# Patient Record
Sex: Male | Born: 2000 | Race: Black or African American | Hispanic: No | Marital: Single | Smoking: Never smoker
Health system: Southern US, Community
[De-identification: ages and names within clinical notes are randomized; demographics above are authoritative.]

## PROBLEM LIST (undated history)

## (undated) DIAGNOSIS — F79 Unspecified intellectual disabilities: Secondary | ICD-10-CM

## (undated) DIAGNOSIS — I639 Cerebral infarction, unspecified: Secondary | ICD-10-CM

## (undated) DIAGNOSIS — J324 Chronic pansinusitis: Secondary | ICD-10-CM

## (undated) DIAGNOSIS — U071 COVID-19: Secondary | ICD-10-CM

## (undated) DIAGNOSIS — E162 Hypoglycemia, unspecified: Secondary | ICD-10-CM

## (undated) DIAGNOSIS — R569 Unspecified convulsions: Secondary | ICD-10-CM

## (undated) DIAGNOSIS — E079 Disorder of thyroid, unspecified: Secondary | ICD-10-CM

## (undated) DIAGNOSIS — F909 Attention-deficit hyperactivity disorder, unspecified type: Secondary | ICD-10-CM

## (undated) DIAGNOSIS — R531 Weakness: Secondary | ICD-10-CM

## (undated) DIAGNOSIS — E237 Disorder of pituitary gland, unspecified: Secondary | ICD-10-CM

## (undated) DIAGNOSIS — I6389 Other cerebral infarction: Secondary | ICD-10-CM

## (undated) HISTORY — PX: TONSILLECTOMY: SUR1361

## (undated) HISTORY — PX: EYE SURGERY: SHX253

---
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
## 2019-01-28 ENCOUNTER — Other Ambulatory Visit: Payer: Self-pay | Admitting: Family Medicine

## 2019-01-28 DIAGNOSIS — F909 Attention-deficit hyperactivity disorder, unspecified type: Secondary | ICD-10-CM

## 2019-01-28 NOTE — Telephone Encounter (Signed)
Patients mom called to get a refill on his [facility], please give pt's mom a call back.

## 2019-01-29 NOTE — Telephone Encounter (Signed)
Needs appointment for further refills. Please schedule patient for visit. Can be in [place], DO, PGY-3 [facility] 01/29/2019 8:19 AM

## 2019-02-05 NOTE — Telephone Encounter (Signed)
Pt mom informed and scheduled for an appt. Nhu Glasby Kennon Holter, CMA

## 2019-02-12 ENCOUNTER — Other Ambulatory Visit: Payer: Self-pay

## 2019-02-12 ENCOUNTER — Ambulatory Visit (INDEPENDENT_AMBULATORY_CARE_PROVIDER_SITE_OTHER): Payer: Medicaid Other | Admitting: Family Medicine

## 2019-02-12 ENCOUNTER — Encounter: Payer: Self-pay | Admitting: Family Medicine

## 2019-02-12 DIAGNOSIS — Z23 Encounter for immunization: Secondary | ICD-10-CM | POA: Diagnosis not present

## 2019-02-12 DIAGNOSIS — F909 Attention-deficit hyperactivity disorder, unspecified type: Secondary | ICD-10-CM | POA: Diagnosis not present

## 2019-02-12 DIAGNOSIS — I6389 Other cerebral infarction: Secondary | ICD-10-CM

## 2019-02-12 MED ORDER — DEXMETHYLPHENIDATE HCL 10 MG PO TABS: 10.0000 mg | ORAL_TABLET | Freq: Two times a day (BID) | ORAL | 0 refills | Status: DC

## 2019-02-12 NOTE — Assessment & Plan Note (Signed)
Sounds like Focalin has worked for him in the past.  I discussed with him reasons that he would not want to take it and he was not able to really tell me.  He did agree to try going back on it more regularly.  I will refill.  I recommended they follow-up with your PCP more often as she is quite complicated and it would be nice to see him every 2 to 3 months for a while.

## 2019-02-12 NOTE — Patient Instructions (Signed)
I have sent your Rx for focalin in. Please make an appointment on your way out with your PCP for one month. Please ask Dr. Buddy Duty to send [place] some notes of office visits.

## 2019-02-12 NOTE — Assessment & Plan Note (Signed)
Evaluated by cardiology in the I felt he did not meet diagnostic criteria for Brugada syndrome

## 2019-02-12 NOTE — Progress Notes (Signed)
    CHIEF COMPLAINT / HPI: With his respite care worker, Sean Kim.  I spoke with his mother via phone. He has not been taking his Focalin regularly but admits his behavior is much worse without it.  He cannot exactly tell me why he does not want to take it every day.  Evidently he has had some recent behavioral issues and he is now ready to retry that.  He denies any problems with it specifically no abdominal pain, no headache, no insomnia.   #2.  He reports following up with the endocrinologist.  When I spoke to mom, she said he saw him about 6 to 8 weeks ago and follows regularly.  The endocrinologist is taking care of all of his endocrine disorders and he is taking his thyroid medicine regularly per report. REVIEW OF SYSTEMS: Denies fever, denies unusual weight change, denies cough  PERTINENT  PMH / PSH: I have reviewed the patient's medications, allergies, past medical and surgical history, smoking status and updated in the EMR as appropriate.   OBJECTIVE:  Vital signs reviewed. GENERAL: Well-developed, well-nourished, no acute distress. CARDIOVASCULAR: Regular rate and rhythm no murmur gallop or rub LUNGS: Clear to auscultation bilaterally, no rales or wheeze. ABDOMEN: Soft positive bowel sounds NEURO: No gross focal neurological deficits.  His speech is somewhat slowed.  He answers simple questions and follows commands. MSK: Movement of extremity x 4.    ASSESSMENT / PLAN:   Abnormal EKG Evaluated by cardiology in the I felt he did not meet diagnostic criteria for Brugada syndrome  ADHD Sounds like Focalin has worked for him in the past.  I discussed with him reasons that he would not want to take it and he was not able to really tell me.  He did agree to try going back on it more regularly.  I will refill.  I recommended they follow-up with your PCP more often as she is quite complicated and it would be nice to see him every 2 to 3 months for a while.   Immunization: After  some discussion he agreed to take the flu shot today if I would give him 2 stickers which I did.

## 2019-03-09 NOTE — Progress Notes (Signed)
   Subjective:    Patient ID: Sean Kim, male    DOB: 2000-05-24, 19 y.o.   MRN: [id number]   CC: ADHD f/u   HPI: ADHD Seen with Belinda Block, respite care worker who works with patient daily  Medication: focalin 10mg  bid Compliance: mother reports poor compliance, can go 2-3 days without medications. Patient reports this is because he is tired from staying up all night so won't wake up when mother calls and won't take medications unless mother give it to him Behavior: reports he is more calm, Wells Guiles notes that he seems upbeat today but after taking the focalin 10mg  he seemed lethargic  Appetite: eating well  Concentration: not concentrating at [employer], but Wells Guiles thinks it is because he does not like it. Had an interview at [facility] [employer] and concentrated throughout the interview. Is able to do practice interviewed with Wells Guiles and concentrate. When he gets tired he does not concentrate as well.  Sleep: horrible, stays up all night playing video games and watching things on his phone Symptoms: no chest pain, palpitaitons, SOB, or agitation.    Objective:  BP 102/72   Pulse 91   Wt 213 lb 6.4 oz (96.8 kg)   SpO2 99%   BMI 33.42 kg/m  Vitals and nursing note reviewed  General: well nourished, in no acute distress HEENT: normocephalic, PERRL, no scleral icterus, EOMI, moist mucous membranes  Neck: supple  Cardiac: RRR, clear S1 and S2, no murmurs, rubs, or gallops Respiratory: clear to auscultation bilaterally, no increased work of breathing Abdomen: soft, nontender, nondistended, no masses or organomegaly. Bowel sounds present Extremities: no edema or cyanosis. Warm, well perfused.   Skin: warm and dry, no rashes noted Neuro: alert and oriented, no focal deficits   Assessment & Plan:    ADHD Dose of Focalin seems to be too high for patient as his respite care worker describes him as being too tired and lethargic.  Patient's concentration seems to be related to being  tired.  Patient does not sleep at night because he is playing video games and watching television all night.  Discussed sleep hygiene with patient.  Advised to stop using all electronics around 8 PM to avoid light stimulation.  Regular sleep cycle discussed with patient.  Mother also reports he is noncompliant on medications because he is not sleeping well.  Hopefully sleeping well daily and being compliant medications will help with his concentration.  Will decrease Focalin to 5 mg twice daily.  Follow-up in 1 month to ensure this is helpful.    Return in about 1 month (around 04/11/2019).   Caroline More, DO, PGY-3

## 2019-03-11 ENCOUNTER — Encounter: Payer: Self-pay | Admitting: Family Medicine

## 2019-03-11 ENCOUNTER — Other Ambulatory Visit: Payer: Self-pay

## 2019-03-11 ENCOUNTER — Ambulatory Visit (INDEPENDENT_AMBULATORY_CARE_PROVIDER_SITE_OTHER): Payer: Medicaid Other | Admitting: Family Medicine

## 2019-03-11 DIAGNOSIS — F909 Attention-deficit hyperactivity disorder, unspecified type: Secondary | ICD-10-CM | POA: Diagnosis present

## 2019-03-11 MED ORDER — DEXMETHYLPHENIDATE HCL 5 MG PO TABS: 5.0000 mg | ORAL_TABLET | Freq: Two times a day (BID) | ORAL | 0 refills | Status: DC

## 2019-03-11 NOTE — Assessment & Plan Note (Signed)
Dose of Focalin seems to be too high for patient as his respite care worker describes him as being too tired and lethargic.  Patient's concentration seems to be related to being tired.  Patient does not sleep at night because he is playing video games and watching television all night.  Discussed sleep hygiene with patient.  Advised to stop using all electronics around 8 PM to avoid light stimulation.  Regular sleep cycle discussed with patient.  Mother also reports he is noncompliant on medications because he is not sleeping well.  Hopefully sleeping well daily and being compliant medications will help with his concentration.  Will decrease Focalin to 5 mg twice daily.  Follow-up in 1 month to ensure this is helpful.

## 2019-03-11 NOTE — Patient Instructions (Signed)
Attention Deficit Hyperactivity Disorder, Adult Attention deficit hyperactivity disorder (ADHD) is a mental health disorder that starts during childhood (neurodevelopmental disorder). For many people with ADHD, the disorder continues into the adult years. Treatment can help you manage your symptoms. What are the causes? The exact cause of ADHD is not known. Most experts believe genetics and environmental factors contribute to ADHD. What increases the risk? The following factors may make you more likely to develop this condition:  Having a family history of ADHD.  Being male.  Being born to a mother who smoked or drank alcohol during pregnancy.  Being exposed to lead or other toxins in the womb or early in life.  Being born before 37 weeks of pregnancy (prematurely) or at a low birth weight.  Having experienced a brain injury. What are the signs or symptoms? Symptoms of this condition depend on the type of ADHD. The two main types are inattentive and hyperactive-impulsive. Some people may have symptoms of both types. Symptoms of the inattentive type include:  Difficulty paying attention.  Making careless mistakes.  Not following instructions.  Being disorganized.  Avoiding tasks that require time and attention.  Losing and forgetting things.  Being easily distracted. Symptoms of the hyperactive-impulsive type include:  Restlessness.  Talking too much.  Interrupting.  Difficulty with: ? Sitting still. ? Feeling motivated. ? Relaxing. ? Waiting in line or waiting for a turn. In adults, this condition may lead to certain problems, such as:  Keeping jobs.  Performing tasks at work.  Having stable relationships.  Being on time or keeping to a schedule. How is this diagnosed? This condition is diagnosed based on your current symptoms and your history of symptoms. The diagnosis can be made by a health care provider such as a primary care provider or a mental health  care specialist. Your health care provider may use a symptom checklist or a behavior rating scale to evaluate your symptoms. He or she may also want to talk with people who have observed your behaviors throughout your life. How is this treated? This condition can be treated with medicines and behavior therapy. Medicines may be the best option to reduce impulsive behaviors and improve attention. Your health care provider may recommend:  Stimulant medicines. These are the most common medicines used for adult ADHD. They affect certain chemicals in the brain (neurotransmitters) and improve your ability to control your symptoms.  A non-stimulant medicine for adult ADHD (atomoxetine). This medicine increases a neurotransmitter called norepinephrine. It may take weeks to months to see effects from this medicine. Counseling and behavioral management are also important for treating ADHD. Counseling is often used along with medicine. Your health care provider may suggest:  Cognitive behavioral therapy (CBT). This type of therapy teaches you to replace negative thoughts and actions with positive thoughts and actions. When used as part of ADHD treatment, this therapy may also include: ? Coping strategies for organization, time management, impulse control, and stress reduction. ? Mindfulness and meditation training.  Behavioral management. You may work with a coach who is specially trained to help people with ADHD manage and organize activities and function more effectively. Follow these instructions at home: Medicines   Take over-the-counter and prescription medicines only as told by your health care provider.  Talk with your health care provider about the possible side effects of your medicines and how to manage them. Lifestyle   Do not use drugs.  Do not drink alcohol if: ? Your health care provider tells you   not to drink. ? You are pregnant, may be pregnant, or are planning to become  pregnant.  If you drink alcohol: ? Limit how much you use to:  0-1 drink a day for women.  0-2 drinks a day for men. ? Be aware of how much alcohol is in your drink. In the [place] one drink equals one 12 oz bottle of beer (355 mL), one 5 oz glass of wine (148 mL), or one 1 oz glass of hard liquor (44 mL).  Get enough sleep.  Eat a healthy diet.  Exercise regularly. Exercise can help to reduce stress and anxiety. General instructions  Learn as much as you can about adult ADHD, and work closely with your health care providers to find the treatments that work best for you.  Follow the same schedule each day.  Use reminder devices like notes, calendars, and phone apps to stay on time and organized.  Keep all follow-up visits as told by your health care provider and therapist. This is important. Where to find more information A health care provider may be able to recommend resources that are available online or over the phone. You could start with:  Attention Deficit Disorder Association (ADDA): PubAddiction.co.nz  National Institute of Mental Health [facility]): https://carter.com/ Contact a health care provider if:  Your symptoms continue to cause problems.  You have side effects from your medicine, such as: ? Repeated muscle twitches, coughing, or speech outbursts. ? Sleep problems. ? Loss of appetite. ? Dizziness. ? Unusually fast heartbeat. ? Stomach pains. ? Headaches.  You are struggling with anxiety, depression, or substance abuse. Get help right away if you:  Have a severe reaction to a medicine. If you ever feel like you may hurt yourself or others, or have thoughts about taking your own life, get help right away. You can go to the nearest emergency department or call:  Your local emergency services (911 in the [place]  A suicide crisis helpline, such as the [place] at 9797047340. This is open 24 hours a day. Summary  ADHD is a mental health  disorder that starts during childhood (neurodevelopmental disorder) and often continues into the adult years.  The exact cause of ADHD is not known. Most experts believe genetics and environmental factors contribute to ADHD.  There is no cure for ADHD, but treatment with medicine, cognitive behavioral therapy, or behavioral management can help you manage your condition. This information is not intended to replace advice given to you by your health care provider. Make sure you discuss any questions you have with your health care provider. Document Revised: 07/15/2018 Document Reviewed: 07/15/2018 Elsevier Patient Education  [place] tablets What is this medicine? DEXMETHYLPHENIDATE (dex meth ill FEN i date) is used to treat attention-deficit hyperactivity disorder. Federal law prohibits the transfer of this medicine to any person other than the person for whom it was prescribed. Do not share this medicine with anyone else. This medicine may be used for other purposes; ask your health care provider or pharmacist if you have questions. COMMON BRAND NAME(S): Focalin What should I tell my health care provider before I take this medicine? They need to know if you have any of these conditions:  circulation problems in fingers and toes  hardening or blockages of the arteries or heart blood vessels  heart disease or a heart defect  high blood pressure  history of a drug or alcohol abuse problem  history of stroke  mental  illness  suicidal thoughts, plans, or attempt; a previous suicide attempt by you or a family member  an unusual or allergic reaction to dexmethylphenidate, methylphenidate, other medicines, foods, dyes, or preservatives  pregnant or trying to get pregnant  breast-feeding How should I use this medicine? Take this medicine by mouth with a glass of water. Follow the directions on the prescription label. You can take this medicine with or  without food. Take your doses at regular intervals. Usually the last dose of the day will be taken at least 4 to 6 hours before your normal bedtime, so it will not interfere with sleep. Do not take your medicine more often than directed. A special MedGuide will be given to you by the pharmacist with each prescription and refill. Be sure to read this information carefully each time. Talk to your pediatrician regarding the use of this medicine in children. While this medicine may be prescribed for children as young as 6 years for selected conditions, precautions do apply. Overdosage: If you think you have taken too much of this medicine contact a poison control center or emergency room at once. NOTE: This medicine is only for you. Do not share this medicine with others. What if I miss a dose? If you miss a dose, take it as soon as you can. If it is almost time for your next dose, take only that dose. Do not take double or extra doses. What may interact with this medicine? Do not take this medicine with any of the following medications:  lithium  MAOIs like Carbex, Eldepryl, Marplan, Nardil, and Parnate  other stimulant medicines for attention disorders, weight loss, or to stay awake  procarbazine This medicine may also interact with the following medications:  atomoxetine  caffeine  certain medicines for blood pressure, heart disease, irregular heart beat  certain medicines for depression, anxiety, or psychotic disturbances  certain medicines for seizures like carbamazepine, phenobarbital, phenytoin  cold or allergy medicines  general anesthetics like halothane, isoflurane  medicines that increase the blood pressure like dopamine, dobutamine, or ephedrine  warfarin This list may not describe all possible interactions. Give your health care provider a list of all the medicines, herbs, non-prescription drugs, or dietary supplements you use. Also tell them if you smoke, drink alcohol,  or use illegal drugs. Some items may interact with your medicine. What should I watch for while using this medicine? Visit your doctor or health care professional for regular checks on your progress. This prescription requires that you follow special procedures with your doctor and pharmacy. You will need to have a new written prescription from your doctor or health care professional every time you need a refill. This medicine may affect your concentration, or hide signs of tiredness. Until you know how this drug affects you, do not drive, ride a bicycle, use machinery, or do anything that needs mental alertness. Tell your doctor or health care professional if this medicine loses its effects, or if you feel you need to take more than the prescribed amount. Do not change the dosage without talking to your doctor or health care professional. For males, contact you doctor or health care professional right away if you have an erection that lasts longer than 4 hours or if it becomes painful. This may be a sign of serious problem and must be treated right away to prevent permanent damage. Decreased appetite is a common side effect when starting this medicine. Eating small, frequent meals or snacks can help. Talk to  your doctor if you continue to have poor eating habits. Height and weight growth of a child taking this medicine will be monitored closely. Do not take this medicine close to bedtime. It may prevent you from sleeping. If you are going to need surgery, a MRI, CT scan, or other procedure, tell your doctor that you are taking this medicine. You may need to stop taking this medicine before the procedure. Tell your doctor or healthcare professional right away if you notice unexplained wounds on your fingers and toes while taking this medicine. You should also tell your healthcare provider if you experience numbness or pain, changes in the skin color, or sensitivity to temperature in your fingers or  toes. What side effects may I notice from receiving this medicine? Side effects that you should report to your doctor or health care professional as soon as possible:  allergic reactions like skin rash, itching or hives, swelling of the face, lips, or tongue  changes in vision  chest pain or chest tightness  fast, irregular heartbeat  fingers or toes feel numb, cool, painful  hallucination, loss of contact with reality  high blood pressure  males: prolonged or painful erection  seizures  severe headaches  shortness of breath  suicidal thoughts or other mood changes  trouble walking, dizziness, loss of balance or coordination  uncontrollable head, mouth, neck, arm, or leg movements  unusual bleeding or bruising Side effects that usually do not require medical attention (report to your doctor or health care professional if they continue or are bothersome):  anxious  headache  loss of appetite  nausea, vomiting  trouble sleeping  weight loss This list may not describe all possible side effects. Call your doctor for medical advice about side effects. You may report side effects to FDA at 1-800-FDA-1088. Where should I keep my medicine? Keep out of the reach of children. This medicine can be abused. Keep your medicine in a safe place to protect it from theft. Do not share this medicine with anyone. Selling or giving away this medicine is dangerous and against the law. This medicine may cause accidental overdose and death if taken by other adults, children, or pets. Mix any unused medicine with a substance like cat litter or coffee grounds. Then throw the medicine away in a sealed container like a sealed bag or a coffee can with a lid. Do not use the medicine after the expiration date. Store at room temperature between 15 and 30 degrees C (59 and 86 degrees F). Protect from light and moisture. NOTE: This sheet is a summary. It may not cover all possible information. If  you have questions about this medicine, talk to your doctor, pharmacist, or health care provider.  2020 Elsevier/Gold Standard (2017-03-19 09:45:54)

## 2019-04-17 ENCOUNTER — Telehealth: Payer: Self-pay | Admitting: Family Medicine

## 2019-04-17 ENCOUNTER — Encounter: Payer: Self-pay | Admitting: Family Medicine

## 2019-04-17 NOTE — Telephone Encounter (Signed)
Mother is calling and would like a letter written about her son's disability so that he can get transportation please call mother to discuss. jw

## 2019-04-17 NOTE — Telephone Encounter (Signed)
Mychart message sent to discuss letter and what is needed in the letter. Will attempt to complete once I have this information   Caroline More, DO, PGY-3 [facility] 04/17/2019 4:57 PM

## 2019-04-18 ENCOUNTER — Encounter: Payer: Self-pay | Admitting: Family Medicine

## 2019-04-18 NOTE — Progress Notes (Signed)
Note for [facility] written. Please upload to Jilda Roche, [place], PGY-3 [facility] 04/18/2019 1:51 PM

## 2019-04-18 NOTE — Telephone Encounter (Signed)
Discussed with mother over the phone. Patient needs a letter written to inform SCAT why he would need transportation. Patient requires transport 2/2 cognitive deficit 2/2 CVA. I have written that in note per mother's request as she said they need specific diagnosis in the letter.  Please upload letter to mychart.   Dalphine Handing, PGY-3 [facility] 04/18/2019 1:52 PM

## 2019-04-19 ENCOUNTER — Encounter (HOSPITAL_COMMUNITY): Payer: Self-pay

## 2019-04-19 ENCOUNTER — Other Ambulatory Visit: Payer: Self-pay

## 2019-04-19 ENCOUNTER — Ambulatory Visit (HOSPITAL_COMMUNITY)
Admission: EM | Admit: 2019-04-19 | Discharge: 2019-04-19 | Disposition: A | Discharge status: Home / Self Care | Payer: Medicaid Other | Attending: Urgent Care | Admitting: Urgent Care

## 2019-04-19 DIAGNOSIS — M545 Low back pain, unspecified: Secondary | ICD-10-CM

## 2019-04-19 MED ORDER — NAPROXEN 500 MG PO TABS: 500.0000 mg | ORAL_TABLET | Freq: Two times a day (BID) | ORAL | 0 refills | Status: DC

## 2019-04-19 MED ORDER — TIZANIDINE HCL 4 MG PO TABS: 4.0000 mg | ORAL_TABLET | Freq: Every evening | ORAL | 0 refills | Status: DC | PRN
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
## 2019-04-21 ENCOUNTER — Telehealth: Payer: Self-pay | Admitting: Family Medicine

## 2019-04-21 NOTE — Telephone Encounter (Signed)
Pt. Presented the Professional Verification Form regarding ADA. Last appointment was 03/11/19.   Form placed in red folder

## 2019-04-21 NOTE — Telephone Encounter (Signed)
Reviewed transportation (SCAT) form and placed in PCP's box for completion.  Ozella Almond, [place]

## 2019-04-22 NOTE — Telephone Encounter (Signed)
Voicemail left on patients/mothers phone informing of SCAT forms ready for pick up. I have placed them up front if they call back.

## 2019-04-22 NOTE — Telephone Encounter (Signed)
Form completed and placed in RN box  Caroline More, DO, PGY-3 [facility] 04/22/2019 9:27 AM

## 2019-06-18 NOTE — Telephone Encounter (Signed)
Mother calls nurse line stating SCAT needs more information on patient disability. Mother stated just a letter would be fine, no need for new forms. SCAT needs more info on the limitations of the patient, what he can and can not do, and a more detailed diagnoses. Case worker will pick up once completed. Please advise.

## 2019-06-24 ENCOUNTER — Encounter: Payer: Self-pay | Admitting: Family Medicine

## 2019-06-24 ENCOUNTER — Telehealth: Payer: Self-pay | Admitting: Family Medicine

## 2019-06-24 NOTE — Telephone Encounter (Signed)
Attempted to call but no answer. Sent [employer].   I needed to clarify what specifically SCAT required in the letter. If mother calls please ask her specifically what parts of patient's limitations he would need as I previously wrote in the form what his diagnosis was and his need for transportation. I have also sent a mychart message so I am hopeful she will get back to me via mychart.   Once I have this info I can upload letter to mychart.   Dalphine Handing, PGY-3 [facility] 06/24/2019 12:13 PM

## 2019-06-24 NOTE — Telephone Encounter (Signed)
Was able to call and speak to mom over the phone.  Patient's mom will call his caseworker and obtain the specific details that are needed in the letter.  Patient mother reports that a letter with [facility] letterhead is sufficient.  She will send me a my chart message with this information.  After I obtain this information I can write this letter and upload to my chart.  Dalphine Handing, PGY-3 [facility] 06/24/2019 2:34 PM

## 2019-06-27 ENCOUNTER — Telehealth: Payer: Self-pay | Admitting: Family Medicine

## 2019-06-27 NOTE — Telephone Encounter (Signed)
Note for SCAT form sent via [place], DO, PGY-3 [facility] 06/27/2019 6:54 PM

## 2019-07-03 ENCOUNTER — Encounter: Payer: Self-pay | Admitting: Family Medicine

## 2019-07-04 ENCOUNTER — Ambulatory Visit (INDEPENDENT_AMBULATORY_CARE_PROVIDER_SITE_OTHER): Payer: Medicaid Other | Admitting: Family Medicine

## 2019-07-04 ENCOUNTER — Other Ambulatory Visit: Payer: Self-pay

## 2019-07-04 ENCOUNTER — Telehealth: Payer: Self-pay

## 2019-07-04 VITALS — BP 96/72 | HR 90 | Ht 67.0 in | Wt 223.6 lb

## 2019-07-04 DIAGNOSIS — Z636 Dependent relative needing care at home: Secondary | ICD-10-CM | POA: Diagnosis not present

## 2019-07-04 DIAGNOSIS — Z9151 Personal history of suicidal behavior: Secondary | ICD-10-CM

## 2019-07-04 DIAGNOSIS — Z915 Personal history of self-harm: Secondary | ICD-10-CM | POA: Diagnosis not present

## 2019-07-04 DIAGNOSIS — E162 Hypoglycemia, unspecified: Secondary | ICD-10-CM | POA: Diagnosis present

## 2019-07-04 LAB — GLUCOSE, POCT (MANUAL RESULT ENTRY): POC Glucose: 90 mg/dl (ref 70–99)

## 2019-07-04 NOTE — Progress Notes (Signed)
SUBJECTIVE:   CHIEF COMPLAINT / HPI:   Hypoglycemia Patient has been followed by Dr. Buddy Duty in endocrinology, but over the last few months has become noncompliant with his medication.  Does have a history of hyperglycemia so it was checked today during this visit and was found to be normal. No distress today  Caregiver burden  He was in no physical distress during our visit and was willing to answer some questions, said he has no desire to hurt himself but usually does not feel like it matters if he lives or dies at this point.  He says it is unfair that he has to take all the medication that he does and he is simply tired of it.  Cannot accurately articulate the cost of not taking his medication and according to his stepmother she still has a legal guardian and is not independent although he is big enough and willful enough to refuse medication.  They do both admit that in the past he has attempted suicide by walking into the street but that there is been none of that behavior or threats of such at the house recently.  Stepmom denies that she thinks he needs to go to emergency department or attempt to be committing him but is just hoping that there is some sort of resource to help convince him to start taking care of himself.  Does consent to social work referral because she is feeling overwhelmed  History of attempted suicide Patient does have history of attempted suicide and has been committed at [facility] in the past.  We did have thorough discussion about his PHQ-9 in which he said that he daily thinks that he would be okay if he died but he has no active wish to kill himself or hurt anybody else.  His mother confirms that he is not acting like he was prior to his prior suicide attempts and she does not think that he is to be committed.  He has not been physically threatening anyone else at home or himself.  They have taken precautions after his first attempt to rid the house of weapons.     Mom does accept social work referral for caregiver stress    PERTINENT  PMH / PSH:   OBJECTIVE:   BP 96/72   Pulse 90   Ht 5\' 7"  (1.702 m)   Wt 223 lb 9.6 oz (101.4 kg)   SpO2 99%   BMI 35.02 kg/m   General: Alert, conversational but lacking depth of thought, no physical distress, flat affect Cardiac: Giller rate and rhythm, no murmurs noted Respiratory: Clear to auscultation, no increased work of breathing, no cough, no wheeze   ASSESSMENT/PLAN:   Hypoglycemia Patient has been followed by Dr. Buddy Duty in endocrinology, but over the last few months has become noncompliant with his medication.  Does have a history of hyperglycemia so it was checked today during this visit and was found to be normal. No distress today  Caregiver burden  He was in no physical distress during our visit and was willing to answer some questions, said he has no desire to hurt himself but usually does not feel like it matters if he lives or dies at this point.  He says it is unfair that he has to take all the medication that he does and he is simply tired of it.  Cannot accurately articulate the cost of not taking his medication and according to his stepmother she still has a legal guardian  and is not independent although he is big enough and willful enough to refuse medication.  They do both admit that in the past he has attempted suicide by walking into the street but that there is been none of that behavior or threats of such at the house recently.  Stepmom denies that she thinks he needs to go to emergency department or attempt to be committing him but is just hoping that there is some sort of resource to help convince him to start taking care of himself.  Does consent to social work referral because she is feeling overwhelmed  History of attempted suicide Patient does have history of attempted suicide and has been committed at [facility] in the past.  We did have thorough discussion about his PHQ-9 in  which he said that he daily thinks that he would be okay if he died but he has no active wish to kill himself or hurt anybody else.  His mother confirms that he is not acting like he was prior to his prior suicide attempts and she does not think that he is to be committed.  He has not been physically threatening anyone else at home or himself.  They have taken precautions after his first attempt to rid the house of weapons.    Mom does accept social work referral for caregiver stress     Sherene Sires, [place]

## 2019-07-04 NOTE — Telephone Encounter (Signed)
Patient's mother calls nurse line regarding f/u on mychart message from yesterday 07/03/19. Mother reports mood swings, decreased appetite and increased lethargy. Patient is currently at Dr. Cindra Eves office, however they recommended him being seen by primary care doctor. Mother requesting that patient be seen today.   Scheduled patient in ATC for evaluation this afternoon.   ED precautions given.   To PCP and Dr. Criss Rosales (ATC provider)

## 2019-07-04 NOTE — Patient Instructions (Signed)
Today we talked about refusal to take medication and a feeling of apathy and the belief that you might be better off dead.  I know we talked about you not having an active plan for that, were going to have your stepmother keeping an eye on you and if that changes we do think that you also call the 24-hour hotline or go to the behavioral health urgent care at [facility].  It does seem like you are at least safe from a blood sugar standpoint today which was a concern.  I want to reiterate that I think it is important you start taking your medications as prescribed by Dr. Buddy Duty, I cannot see the notes or actually know which those medications are but he is a specialist in this field and I would suggest he take his advice.  I do think it will help you feel better.   If you are feeling suicidal or depression symptoms worsen please immediately go to:   24 Hour Availability [facility]  [address] [place], [place] [address]  [phone] or [phone]  . If you are thinking about harming yourself or having thoughts of suicide, or if you know someone who is, seek help right away. . Call your doctor or mental health care provider. . Call 911 or go to a hospital emergency room to get immediate help, or ask a friend or family member to help you do these things. . Call the [place] National Suicide Prevention Lifeline's toll-free, 24-hour hotline at 1-800-273-TALK 201 838 7030) or TTY: 1-800-799-4 TTY 3370996279) to talk to a trained counselor. . If you are in crisis, make sure you are not left alone.  . If someone else is in crisis, make sure he or she is not left alone   [facility] [employer] (Domestic Violence, Rape & Victim Assistance [phone]  [facility]  [address], [place]  [address]               [phone] or [phone]  [place]    (ONLY from 8am-4pm)     [phone]  [facility] (24/7)   [phone]  [place] National Suicide Hotline   [phone] Diamantina Monks)

## 2019-07-05 DIAGNOSIS — Z9151 Personal history of suicidal behavior: Secondary | ICD-10-CM | POA: Insufficient documentation

## 2019-07-05 DIAGNOSIS — Z636 Dependent relative needing care at home: Secondary | ICD-10-CM | POA: Insufficient documentation

## 2019-07-05 DIAGNOSIS — Z915 Personal history of self-harm: Secondary | ICD-10-CM | POA: Insufficient documentation

## 2019-07-05 NOTE — Assessment & Plan Note (Signed)
Patient does have history of attempted suicide and has been committed at [facility] in the past.  We did have thorough discussion about his PHQ-9 in which he said that he daily thinks that he would be okay if he died but he has no active wish to kill himself or hurt anybody else.  His mother confirms that he is not acting like he was prior to his prior suicide attempts and she does not think that he is to be committed.  He has not been physically threatening anyone else at home or himself.  They have taken precautions after his first attempt to rid the house of weapons.    Mom does accept social work referral for caregiver stress

## 2019-07-05 NOTE — Assessment & Plan Note (Signed)
He was in no physical distress during our visit and was willing to answer some questions, said he has no desire to hurt himself but usually does not feel like it matters if he lives or dies at this point.  He says it is unfair that he has to take all the medication that he does and he is simply tired of it.  Cannot accurately articulate the cost of not taking his medication and according to his stepmother she still has a legal guardian and is not independent although he is big enough and willful enough to refuse medication.  They do both admit that in the past he has attempted suicide by walking into the street but that there is been none of that behavior or threats of such at the house recently.  Stepmom denies that she thinks he needs to go to emergency department or attempt to be committing him but is just hoping that there is some sort of resource to help convince him to start taking care of himself.  Does consent to social work referral because she is feeling overwhelmed

## 2019-07-05 NOTE — Assessment & Plan Note (Addendum)
Patient has been followed by Dr. Buddy Duty in endocrinology, but over the last few months has become noncompliant with his medication.  Does have a history of hyperglycemia so it was checked today during this visit and was found to be normal. No distress today

## 2019-07-15 ENCOUNTER — Encounter: Payer: Self-pay | Admitting: Family Medicine

## 2019-07-15 ENCOUNTER — Ambulatory Visit: Payer: Self-pay | Admitting: Licensed Clinical Social Worker

## 2019-07-15 NOTE — Chronic Care Management (AMB) (Signed)
    Clinical Social Work  Care Management Outreach   07/15/2019 Name: Sean Kim MRN: [id number] DOB: Apr 25, 2000  Sean Kim is a 19 y.o. year old male who is a primary care patient of Caroline More, DO .   The Care Management team was consulted by Dr. Criss Rosales for assistance with Caregiver Stress.  LCSW reached out to Sean Kim's mother today by phone to introduce self, assess needs and offer Care Management services and interventions.  The outreach was unsuccessful.  A HIPPA compliant phone message was left for the patient providing contact information and requesting a return call.   Review of patient status, including review of consultants reports, relevant laboratory and other test results, and collaboration with appropriate care team members and the patient's provider was performed as part of comprehensive patient evaluation and provision of care management services.    Follow Up Plan: If no return call is received, LCSW will call again in 3 to 5 days.  Casimer Lanius, [place] / [place]   [phone] 1:59 PM

## 2019-07-17 ENCOUNTER — Encounter: Payer: Self-pay | Admitting: Family Medicine

## 2019-07-18 ENCOUNTER — Other Ambulatory Visit: Payer: Self-pay

## 2019-07-18 ENCOUNTER — Telehealth: Payer: Medicaid Other

## 2019-07-18 ENCOUNTER — Ambulatory Visit: Payer: Self-pay | Admitting: Licensed Clinical Social Worker

## 2019-07-18 NOTE — Chronic Care Management (AMB) (Signed)
    Clinical Social Work  Care Management Outreach   07/18/2019 Name: Rucker Toma MRN: [id number] DOB: June 23, 2000  Austan Contorno is a 19 y.o. year old male who is a primary care patient of Caroline More, DO . The Care Management team was consulted for assistance with Caregiver Stress and support.  LCSW reached out to [place] Montalvo's mother today by phone to introduce self, assess needs.  Mother was unable to talk, states she will call LCSW back this afternoon, phone number provided.  Review of patient status, including review of consultants reports, relevant laboratory and other test results, and collaboration with appropriate care team members and the patient's provider was performed as part of comprehensive patient evaluation and provision of care management services.    Follow Up Plan: LCSW will wait for return call.  If no return call is received LCSW will discontinue outreach.  Casimer Lanius, [place] / [place]   [phone] 11:02 AM

## 2019-07-22 ENCOUNTER — Ambulatory Visit: Payer: Self-pay | Admitting: Licensed Clinical Social Worker

## 2019-07-22 NOTE — Chronic Care Management (AMB) (Signed)
    Clinical Social Work  Care Management Outreach   07/22/2019 Name: Sean Kim MRN: [id number] DOB: [birth year]  Sean Kim is a 19 y.o. year old male who is a primary care patient of Caroline More, DO . LCSW returning phone call to  [name]'s mother today.  Left voice messaged. Mother called back.    Review of patient status, including review of consultants reports, relevant laboratory and other test results, and collaboration with appropriate care team members and the patient's provider was performed as part of comprehensive patient evaluation and provision of care management services.    Follow Up Plan: Phone appointment scheduled with LCSW  Casimer Lanius, [place] / [place]   [phone] 4:18 PM

## 2019-07-25 ENCOUNTER — Encounter: Payer: Self-pay | Admitting: Licensed Clinical Social Worker

## 2019-07-25 ENCOUNTER — Ambulatory Visit: Payer: Medicaid Other | Admitting: Licensed Clinical Social Worker

## 2019-07-25 ENCOUNTER — Other Ambulatory Visit: Payer: Self-pay

## 2019-07-25 DIAGNOSIS — Z636 Dependent relative needing care at home: Secondary | ICD-10-CM

## 2019-07-25 NOTE — Chronic Care Management (AMB) (Signed)
Care Management   Clinical Social Work   07/25/2019 Name: Sean Kim MRN: [id number] DOB: 2000-03-09  Referred by: Caroline More, DO  Reason for referral : Care Coordination (caregiver stress and suppor) Sean Kim is a 19 y.o. year old male who is a primary care patient of Caroline More, DO.   Reason for Encounter: Phone encounter with patient's mother today for assessment of needs.  Assessment: Patient continues to experience frustration and levels of anxiety.  Is currently taking Focalin for ADHD. Patient often fails to take his medication as he does not like the way it makes him feel.  Per mom last Medication evaluation was done with when patient was in the 9th grade.  Medication currently filled by PCP. Recommendation: Patient may benefit from, and mom is in agreement to connect with psychiatry and counseling to assist patient with managing his mood and emotions.   Review of patient status, including review of consultants reports, relevant laboratory and other test results, and collaboration with appropriate care team members and the patient's provider was performed as part of comprehensive patient evaluation and provision of care management services.    Advance Directive Status: Not discussed during this encounter SDOH (Social Determinants of Health) assessments performed: No needs identified   Goals Addressed            This Visit's Progress   . mental health support       CARE PLAN ENTRY (see longitudinal plan of care for additional care plan information)  Current Barriers:  . Patient with ADHD and cogitative deficits Mom acknowledges deficits with connecting to mental health provider for counseling and possible medication evaluation.  . Patient is experiencing symptoms of depression and does not like the way his current medication makes him feel.     . Patient and caregiver needs Support, Education, and Care Coordination in order to meet unmet mental health needs    . Patient is doing better with taking focalin for ADHD when father ask him to take it.  However he does not like taking it due to the way it makes him feel. Clinical Social Work [employer]):  Marland Kitchen Over the next 30 days, LCSW will work with patient and caregiver until connected for ongoing counseling and medication management.  . Over the next 30 days, patient will demonstrate improved adherence to prescribed treatment plan for ADHD as evidenced report by mother.  Interventions:  . Assessed understanding, previous  and current treatment and care coordination needs  . Provided basic mental health support, education and interventions  . Collaborated with appropriate clinical care team members regarding patient needs . Discussed several options for long term counseling and medication management based on need and insurance. Will continue to review options and make recommendation. . Reviewed mental health medications with patient's mother at prescribed by PCP and discussed compliance  . Other interventions include: emotional support and task centered Patient Self Care Activities & Deficits:  . Patient is unable to independently navigate community resource options without care coordination support . Patient's mother is motivated for treatment and getting patient the help he needs Initial goal documentation       Outpatient Encounter Medications as of 07/25/2019  Medication Sig  . acetaminophen (TYLENOL) 500 MG tablet Take 1,000 mg by mouth every 6 (six) hours as needed for mild pain, fever or headache.  . B-D 3CC LUER-LOK SYR 22GX1-1/2 22G X 1-1/2" 3 ML MISC USE TO INJECT TESTOSTERONE  . blood glucose meter kit and supplies KIT Dispense  based on patient and insurance preference. Use up to four times daily as directed. (FOR ICD-9 250.00, 250.01).  Marland Kitchen dexmethylphenidate (FOCALIN) 5 MG tablet Take 1 tablet (5 mg total) by mouth 2 (two) times daily.  . Glucagon, rDNA, (GLUCAGON EMERGENCY IJ) Inject 1 mg as  directed as needed (blood sugar).   Marland Kitchen levothyroxine (SYNTHROID) 125 MCG tablet Take 1 tablet (125 mcg total) by mouth daily at 6 (six) AM.  . naproxen (NAPROSYN) 500 MG tablet Take 1 tablet (500 mg total) by mouth 2 (two) times daily.  Marland Kitchen testosterone cypionate (DEPOTESTOSTERONE CYPIONATE) 200 MG/ML injection Inject 1 mL (200 mg total) into the muscle every 21 ( twenty-one) days.  Marland Kitchen tiZANidine (ZANAFLEX) 4 MG tablet Take 1 tablet (4 mg total) by mouth at bedtime as needed for muscle spasms.   No facility-administered encounter medications on file as of 07/25/2019.   Plan: LCSW will locate psychiatry options for patient and discuss with PCP and mom  Casimer Lanius, [place] / [place]   [phone] 4:13 PM

## 2019-08-01 ENCOUNTER — Ambulatory Visit: Payer: Medicaid Other | Admitting: Licensed Clinical Social Worker

## 2019-08-01 DIAGNOSIS — Z7189 Other specified counseling: Secondary | ICD-10-CM

## 2019-08-01 DIAGNOSIS — Z636 Dependent relative needing care at home: Secondary | ICD-10-CM

## 2019-08-01 NOTE — Patient Instructions (Signed)
Licensed Clinical Social Worker Visit Information Mr. Nigg  it was nice speaking with your mother. Please call me directly if you have questions [phone] Goals we discussed today:  Goals Addressed            This Visit's Progress   . Guardianship       CARE PLAN ENTRY (see longitudinal plan of care for additional care plan information)  Current Barriers:  . Patient needs community resources about guardianship,  . Mom Acknowledges deficits and needs support, education and care coordination in order to meet this unmet need Clinical Goal(s)  . Over the next 60 days patient's mother will be able to start guardianship process as demonstrated by completing required paperwork. Interventions provided by LCSW:  . Assessed education and understanding of guardianship  . Provided patient's mother with general information about the guardianship process Patient Self Care Activities & Deficits:  . Patient is unable to independently navigate community resource options without care coordination support  . Acknowledges deficits and is motivated to resolve concern, however has a lot going on and would like to revisit this goal in the next few weeks Initial goal documentation    . mental health support       CARE PLAN ENTRY (see longitudinal plan of care for additional care plan information)  Current Barriers & progress:  . Patient with ADHD and cogitative deficits Mom acknowledges deficits with connecting to mental health provider for counseling and possible medication evaluation.  . Patient is experiencing symptoms of depression and does not like the way his current medication makes him feel.     . Patient and caregiver needs Support, Education, and Care Coordination in order to meet unmet mental health needs  . Patient is doing better with taking focalin for ADHD when father ask him to take it.  However he does not like taking it due to the way it makes him feel. Clinical Social Work [employer]):   Marland Kitchen Over the next 30 days, LCSW will work with patient and caregiver until connected for ongoing counseling and medication management.  . Over the next 30 days, patient will demonstrate improved adherence to prescribed treatment plan for ADHD as evidenced report by mother.  Interventions:  . Assessed understanding, previous  and current treatment and care coordination needs  . Collaborated with appropriate clinical care team members regarding patient needs . Discussed several options for long term counseling and medication management based on need and insurance. Provided patient's mother with psychiatry and psychological resources to review and decide which will meet patient's needs ( [facility],  [facility], [facility] and  [facility]) . Other interventions include: Solution focus and task centered Patient Self Care Activities & Deficits:  . Patient is unable to independently navigate community resource options without care coordination support . Patient's mother is motivated for treatment and getting patient the help he needs . Mom will review information and make a selection will contact LCSW if needed Please see past updates related to this goal by clicking on the "Past Updates" button in the selected goal        Materials provided:  Mr. Khoury received Care Management services today:  1. Care Management services include personalized support from designated clinical staff supervised by his physician, including individualized plan of care and coordination with other care providers 2. 24/7 contact [phone] for assistance for urgent and routine care needs. 3. Care Management are voluntary services and be declined at  any time by calling the office.  patient's mother verbally agreed to assistance and services provided by embedded care coordination/care management team today.   Patient's mother verbalizes understanding of  instructions provided today.  Follow up plan:  SW will follow up with patient by phone over the next 2 weeks  Maurine Cane, LCSW

## 2019-08-01 NOTE — Chronic Care Management (AMB) (Signed)
Care Management   Clinical Social Work Follow Up   08/01/2019 Name: Larenzo Caples MRN: [id number] DOB: March 15, 2000  Referred by: Caroline More, DO  Reason for referral : Care Coordination (caregiver support and resources) Khyler Lambert is a 19 y.o. year old male who is a primary care patient of Caroline More, DO.   Reason for follow-up: Phone encounter with patient's mother today for ongoing assessment and brief interventions to assist with meeting patient's mental health needs.   Assessment: Patient's mother would like assistance with navigating mental health options to manage patient's behavior. Patient needs new evaluation or ADHD medication.   Review of patient status, including review of consultants reports, relevant laboratory and other test results, and collaboration with appropriate care team members and the patient's provider was performed as part of comprehensive patient evaluation and provision of care management services.    SDOH (Social Determinants of Health) assessments performed: No needs identified   Goals Addressed            This Visit's Progress   . Guardianship       CARE PLAN ENTRY (see longitudinal plan of care for additional care plan information)  Current Barriers:  . Patient needs community resources about guardianship,  . Mom Acknowledges deficits and needs support, education and care coordination in order to meet this unmet need Clinical Goal(s)  . Over the next 60 days patient's mother will be able to start guardianship process as demonstrated by completing required paperwork. Interventions provided by LCSW:  . Assessed education and understanding of guardianship  . Provided patient's mother with general information about the guardianship process Patient Self Care Activities & Deficits:  . Patient is unable to independently navigate community resource options without care coordination support  . Acknowledges deficits and is motivated to resolve  concern, however has a lot going on and would like to revisit this goal in the next few weeks Initial goal documentation    . mental health support       CARE PLAN ENTRY (see longitudinal plan of care for additional care plan information)  Current Barriers & progress:  . Patient with ADHD and cogitative deficits Mom acknowledges deficits with connecting to mental health provider for counseling and possible medication evaluation.  . Patient is experiencing symptoms of depression and does not like the way his current medication makes him feel.     . Patient and caregiver needs Support, Education, and Care Coordination in order to meet unmet mental health needs  . Patient is doing better with taking focalin for ADHD when father ask him to take it.  However he does not like taking it due to the way it makes him feel. Clinical Social Work [employer]):  Marland Kitchen Over the next 30 days, LCSW will work with patient and caregiver until connected for ongoing counseling and medication management.  . Over the next 30 days, patient will demonstrate improved adherence to prescribed treatment plan for ADHD as evidenced report by mother.  Interventions:  . Assessed understanding, previous  and current treatment and care coordination needs  . Collaborated with appropriate clinical care team members regarding patient needs . Discussed several options for long term counseling and medication management based on need and insurance. Provided patient's mother with psychiatry and psychological resources to review and decide which will meet patient's needs ( [facility],  [facility], [facility] and  [facility]) . Other interventions include: Solution focus and task centered Patient Self Care  Activities & Deficits:  . Patient is unable to independently navigate community resource options without care coordination support . Patient's mother is motivated for  treatment and getting patient the help he needs . Mom will review information and make a selection will contact LCSW if needed Please see past updates related to this goal by clicking on the "Past Updates" button in the selected goal         Outpatient Encounter Medications as of 08/01/2019  Medication Sig  . acetaminophen (TYLENOL) 500 MG tablet Take 1,000 mg by mouth every 6 (six) hours as needed for mild pain, fever or headache.  . B-D 3CC LUER-LOK SYR 22GX1-1/2 22G X 1-1/2" 3 ML MISC USE TO INJECT TESTOSTERONE  . blood glucose meter kit and supplies KIT Dispense based on patient and insurance preference. Use up to four times daily as directed. (FOR ICD-9 250.00, 250.01).  Marland Kitchen dexmethylphenidate (FOCALIN) 5 MG tablet Take 1 tablet (5 mg total) by mouth 2 (two) times daily.  . Glucagon, rDNA, (GLUCAGON EMERGENCY IJ) Inject 1 mg as directed as needed (blood sugar).   Marland Kitchen levothyroxine (SYNTHROID) 125 MCG tablet Take 1 tablet (125 mcg total) by mouth daily at 6 (six) AM.  . naproxen (NAPROSYN) 500 MG tablet Take 1 tablet (500 mg total) by mouth 2 (two) times daily.  Marland Kitchen testosterone cypionate (DEPOTESTOSTERONE CYPIONATE) 200 MG/ML injection Inject 1 mL (200 mg total) into the muscle every 21 ( twenty-one) days.  Marland Kitchen tiZANidine (ZANAFLEX) 4 MG tablet Take 1 tablet (4 mg total) by mouth at bedtime as needed for muscle spasms.   No facility-administered encounter medications on file as of 08/01/2019.   Plan: Mom will review resources provided.  LCSW will F/U in two weeks  Casimer Lanius, [place] / [place]   [phone] 3:46 PM

## 2019-08-07 ENCOUNTER — Encounter (HOSPITAL_COMMUNITY): Payer: Self-pay

## 2019-08-07 ENCOUNTER — Ambulatory Visit (INDEPENDENT_AMBULATORY_CARE_PROVIDER_SITE_OTHER): Payer: Medicaid Other

## 2019-08-07 ENCOUNTER — Other Ambulatory Visit: Payer: Self-pay

## 2019-08-07 ENCOUNTER — Ambulatory Visit (HOSPITAL_COMMUNITY)
Admission: EM | Admit: 2019-08-07 | Discharge: 2019-08-07 | Disposition: A | Discharge status: Home / Self Care | Payer: Medicaid Other

## 2019-08-07 DIAGNOSIS — M25512 Pain in left shoulder: Secondary | ICD-10-CM | POA: Diagnosis not present

## 2019-08-07 DIAGNOSIS — M79605 Pain in left leg: Secondary | ICD-10-CM

## 2019-08-07 MED ORDER — NAPROXEN 500 MG PO TABS
500.0000 mg | ORAL_TABLET | Freq: Two times a day (BID) | ORAL | 0 refills | Status: DC
Start: 2019-08-07 — End: 2019-09-10

## 2019-08-07 MED ORDER — CYCLOBENZAPRINE HCL 5 MG PO TABS: 5.0000 mg | ORAL_TABLET | Freq: Two times a day (BID) | ORAL | 0 refills | Status: DC | PRN

## 2019-08-07 NOTE — ED Provider Notes (Signed)
[place]    CSN: [id number] Arrival date & time: 08/07/19  1551      History   Chief Complaint Chief Complaint  Patient presents with  . Leg Pain  . Arm Pain    HPI Sean Kim is a 19 y.o. male history of cognitive deficits, presenting today for evaluation of left shoulder and left thigh/knee pain after injury.  Patient was tackled approximately 1 week ago playing football, landed on his left side.  Since he has pain in his left shoulder along with his left leg.  Mainly in the thigh and knee.  Does report feeling a popping sensation at time of incident.  Denies persistent popping tearing sensation.  Denies increased instability.  Denies hitting head or loss of consciousness.  Has taken medicine for pain, but is unsure of the name.  HPI  Past Medical History:  Diagnosis Date  . ADHD   . Hypoglycemia   . MR (mental retardation)   . Pituitary abnormality ([place])   . Seizures ([place])   . Stroke ([place])   . Thyroid disease     Patient Active Problem List   Diagnosis Date Noted  . Caregiver burden 07/05/2019  . History of attempted suicide 07/05/2019  . Constipation 11/12/2018  . Abnormal EKG 11/12/2018  . Elevated alkaline phosphatase level 05/29/2018  . Routine adult health maintenance 10/31/2017  . Healthy adult on routine physical examination 10/31/2017  . 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([place])   . Cognitive deficits   . Intermittent left side weakness   . 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 ADHD 07/29/2017  . Hypoglycemia 07/23/2017    Past Surgical History:  Procedure Laterality Date  . EYE SURGERY     x2  . TONSILLECTOMY         Home Medications    Prior to Admission medications   Medication Sig Start Date End Date Taking? Authorizing Provider  cloNIDine (CATAPRES) 0.1 MG tablet Take by mouth. 02/08/16  Yes [provider]  acetaminophen (TYLENOL) 500 MG tablet Take 1,000 mg by mouth every 6 (six) hours as needed  for mild pain, fever or headache.    [provider]  B-D 3CC LUER-LOK SYR 22GX1-1/2 22G X 1-1/2" 3 ML MISC USE TO INJECT TESTOSTERONE 08/09/17   [provider]  blood glucose meter kit and supplies KIT Dispense based on patient and insurance preference. Use up to four times daily as directed. (FOR ICD-9 250.00, 250.01). 08/03/17   Sherene Sires, DO  cyclobenzaprine (FLEXERIL) 5 MG tablet Take 1-2 tablets (5-10 mg total) by mouth 2 (two) times daily as needed for muscle spasms. 08/07/19   Tayven Renteria C, PA-C  dexmethylphenidate (FOCALIN) 5 MG tablet Take 1 tablet (5 mg total) by mouth 2 (two) times daily. 03/11/19   Caroline More, DO  Glucagon, rDNA, (GLUCAGON EMERGENCY IJ) Inject 1 mg as directed as needed (blood sugar).     [provider]  levothyroxine (SYNTHROID) 125 MCG tablet Take 1 tablet (125 mcg total) by mouth daily at 6 (six) AM. 11/09/18   Mullis, Kiersten P, DO  naproxen (NAPROSYN) 500 MG tablet Take 1 tablet (500 mg total) by mouth 2 (two) times daily. 08/07/19   Saheed Carrington C, PA-C  testosterone cypionate (DEPOTESTOSTERONE CYPIONATE) 200 MG/ML injection Inject 1 mL (200 mg total) into the muscle every 21 ( twenty-one) days. 08/17/17   Rogue Bussing, MD    Family History Family History  Problem Relation Age of Onset  .  Hypertension Maternal Grandmother     Social History Social History   Tobacco Use  . Smoking status: Never Smoker  . Smokeless tobacco: Never Used  Substance Use Topics  . Alcohol use: No  . Drug use: No     Allergies   Patient has no known allergies.   Review of Systems Review of Systems  Constitutional: Negative for fatigue and fever.  HENT: Negative for congestion, sinus pressure and sore throat.   Eyes: Negative for photophobia, pain and visual disturbance.  Respiratory: Negative for cough and shortness of breath.   Cardiovascular: Negative for chest pain.  Gastrointestinal: Negative for abdominal pain,  nausea and vomiting.  Genitourinary: Negative for decreased urine volume and hematuria.  Musculoskeletal: Positive for arthralgias and myalgias. Negative for neck pain and neck stiffness.  Neurological: Negative for dizziness, syncope, facial asymmetry, speech difficulty, weakness, light-headedness, numbness and headaches.     Physical Exam Triage Vital Signs ED Triage Vitals [08/07/19 1608]  Enc Vitals Group     BP 113/84     Pulse Rate (!) 106     Resp 16     Temp 98 F (36.7 C)     Temp src      SpO2 100 %     Weight      Height      Head Circumference      Peak Flow      Pain Score 8     Pain Loc      Pain Edu?      Excl. in [place]?    No data found.  Updated Vital Signs BP 113/84   Pulse (!) 106   Temp 98 F (36.7 C)   Resp 16   SpO2 100%   Visual Acuity Right Eye Distance:   Left Eye Distance:   Bilateral Distance:    Right Eye Near:   Left Eye Near:    Bilateral Near:     Physical Exam Vitals and nursing note reviewed.  Constitutional:      Appearance: He is well-developed.     Comments: No acute distress  HENT:     Head: Normocephalic and atraumatic.     Nose: Nose normal.  Eyes:     Conjunctiva/sclera: Conjunctivae normal.  Cardiovascular:     Rate and Rhythm: Normal rate.  Pulmonary:     Effort: Pulmonary effort is normal. No respiratory distress.  Abdominal:     General: There is no distension.  Musculoskeletal:        General: Normal range of motion.     Cervical back: Neck supple.     Comments: Left shoulder: Nontender to palpation along clavicle, tenderness to palpation of AC joint and into proximal humeral area, tender diffusely throughout trapezius and periscapular area of thoracic back Full active range of motion of shoulder Nontender distally at elbow forearm or wrist  Left knee: Mild tenderness to palpation over patella, nontender along medial and lateral joint line, nontender and popliteal area or extending into calf, does have  suprapatellar tenderness that extends into belly of quadricep musculature  Skin:    General: Skin is warm and dry.  Neurological:     Mental Status: He is alert and oriented to person, place, and time.      UC Treatments / Results  Labs (all labs ordered are listed, but only abnormal results are displayed) Labs Reviewed - No data to display  EKG   Radiology DG Knee Complete 4 Views Left  Result Date:  08/07/2019 CLINICAL DATA:  Left knee pain, fall EXAM: LEFT KNEE - COMPLETE 4+ VIEW COMPARISON:  None. FINDINGS: No evidence of fracture, dislocation, or joint effusion. No evidence of arthropathy or other focal bone abnormality. Soft tissues are unremarkable. IMPRESSION: Negative. Electronically Signed   By: Rolm Baptise M.D.   On: 08/07/2019 17:37    Procedures Procedures (including critical care time)  Medications Ordered in UC Medications - No data to display  Initial Impression / Assessment and Plan / UC Course  I have reviewed the triage vital signs and the nursing notes.  Pertinent labs & imaging results that were available during my care of the patient were reviewed by me and considered in my medical decision making (see chart for details).     X-rays of shoulder and knee negative for acute fracture.  Will call and alter plan if radiology read differing.  Most likely spraining and contusions from impact.  Recommending anti-inflammatories and muscle relaxers with gentle stretching, alternate ice and heat.  Continue to monitor for gradual improvement.  Discussed strict return precautions. Patient verbalized understanding and is agreeable with plan.  Final Clinical Impressions(s) / UC Diagnoses   Final diagnoses:  Left leg pain  Acute pain of left shoulder     Discharge Instructions     Xray appear normal- I will call if radiologist reads xrays differently Naprosyn twice daily for pain/swelling Ice  Gentle stretching You may use flexeril as needed to help with  shoulder/back pain. This is a muscle relaxer and causes sedation- please use only at bedtime or when you will be home and not have to drive/work  Follow up if not improving    ED Prescriptions    Medication Sig Dispense Auth. Provider   naproxen (NAPROSYN) 500 MG tablet Take 1 tablet (500 mg total) by mouth 2 (two) times daily. 30 tablet Taniesha Glanz C, PA-C   cyclobenzaprine (FLEXERIL) 5 MG tablet Take 1-2 tablets (5-10 mg total) by mouth 2 (two) times daily as needed for muscle spasms. 24 tablet Terralyn Matsumura, [place] C, PA-C     PDMP not reviewed this encounter.   Janith Lima, PA-C 08/07/19 1740

## 2019-08-07 NOTE — ED Triage Notes (Signed)
Pt c/o left leg and left arm pain after getting tackled playing football last Friday

## 2019-08-07 NOTE — Discharge Instructions (Signed)
Xray appear normal- I will call if radiologist reads xrays differently Naprosyn twice daily for pain/swelling Ice  Gentle stretching You may use flexeril as needed to help with shoulder/back pain. This is a muscle relaxer and causes sedation- please use only at bedtime or when you will be home and not have to drive/work  Follow up if not improving

## 2019-08-12 ENCOUNTER — Ambulatory Visit (INDEPENDENT_AMBULATORY_CARE_PROVIDER_SITE_OTHER): Payer: Medicaid Other | Admitting: Pediatrics

## 2019-08-12 ENCOUNTER — Encounter (INDEPENDENT_AMBULATORY_CARE_PROVIDER_SITE_OTHER): Payer: Self-pay | Admitting: Pediatrics

## 2019-08-12 ENCOUNTER — Other Ambulatory Visit: Payer: Self-pay

## 2019-08-12 VITALS — BP 108/72 | HR 80 | Ht 69.0 in | Wt 216.8 lb

## 2019-08-12 DIAGNOSIS — U071 COVID-19: Secondary | ICD-10-CM

## 2019-08-12 DIAGNOSIS — G9349 Other encephalopathy: Secondary | ICD-10-CM

## 2019-08-12 DIAGNOSIS — I6389 Other cerebral infarction: Secondary | ICD-10-CM | POA: Diagnosis not present

## 2019-08-12 NOTE — Patient Instructions (Addendum)
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  Specifically he is experiencing problems with memory and thinking that have worsened his baseline difficulties.  Looking at him closely today I see no change in his exam in comparison with 2 years ago.  Nonetheless the symptoms that he complains of are similar to people who complaint of thinking and memory as result of Covid that some people called "brain fog".  As best I know there is no diagnostic test for this and no treatment.  I am fairly certain that [name] has a long-haul clinic.  I am hopeful that your primary physician can access that for you.  If you have a problem with it please get back with me and I will see what I can do.

## 2019-08-12 NOTE — Progress Notes (Signed)
Patient: Sean Kim MRN: [id number] Sex: male DOB: Jul 28, 2000  Provider: Wyline Copas, MD Location of Care: [facility]  Note type: Routine return visit  History of Present Illness: Referral Source: Olene Floss, MD History from: mother, patient and [facility] chart Chief Complaint: Memory loss  Sean Kim is a 19 y.o. male who was evaluated August 12, 2019 for the first time since August 21, 2017.  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  He had a prolonged seizure-like event that caused injury to the right brain manifested by diffuse cortical and subcortical atrophy hydrocephalus ex vacuo and wall area degeneration.  Subsequent to this, during periods of hypoglycemia he experiences left-sided weakness but at baseline is not obvious.  This is described in the August 21, 2017 note.  He responds to Solu-Cortef.  His most recent CT scan in Aug 02, 2017 was unchanged with no acute lesion.  He has been followed at [facility] in the past but was never placed on antiepileptic medicine he had two EEGs which showed slowing that was paradoxical more prominent over the left side than the right despite his atrophy.  He had significant decline in his cognitive abilities following the 2014 event and his reading and spelling on the first grade level in math on a second grade level.  He was on a vocational track at [employer] and graduated with a Theme park manager.  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  He contracted coronavirus and was diagnosed September 24, 2018.  He continued to have intermittent symptoms, became coronavirus negative and then positive again.  He had multiple episodes of hypotension was treated with dexamethasone, remdesivir because of desaturation.  The Covid team felt that this was not related to Covid he was hospitalized August 30 through September 4.  He is noted to have enlarged and partially  necrotic left cervical adenopathy treated with a 10-day course of Augmentin.  He met criteria for Brugada syndrome.  Following this course, his mother said that he had problems with memory he would forget things.  At work, he becomes very frustrated when he could not remember the orders of people despite the fact that the orders are written on the screen in the areas where he works.  Is not clear to me why he would not use that.  He often forgets conversations.  He becomes very angry when he forgets things.  He has been placed on naproxen 500 mg daily as anti-inflammatory.  He has not had any seizures in 2 years.  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  I realize the physicians at [facility] who certainly are more versed in this than I felt that his hospitalization in late August early September was not truly related to Covid.  His mother requested neurological consultation because she felt that there was something wrong with him that needed to be addressed.  He seems to be sleeping well.  He has not otherwise been ill.  Review of Systems: A complete review of systems was remarkable for patient is here to be seen for memory loss. Mom reports that the patient has been havng issues with memry loss. She also ststae sthat he is forgetting things. She states that it is everyday things. She states that if he is given instructions, he forgets and it has started to embarass him.  She states thta if he misplaces something, he will get angry and blame others but when he finds it, he is okay. She reports no seizures since his last visit. No other concerns at this time., all other systems reviewed and negative.  Past Medical History Diagnosis Date  . ADHD   . Hypoglycemia   . MR (mental retardation)   . 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 Seizures ([place])   . Stroke ([place])   . Thyroid disease    Hospitalizations:  Yes.  , Head Injury: No., Nervous System Infections: No., Immunizations up to date: Yes.    See history of present illness  Birth History 6 lbs. 0 oz. infant born at [redacted] weeks gestational age to a 20 year old g 2 p 0 1 0 1 male. Gestation was complicated by early delivery second to sexually transmitted disease? Mother received Epidural anesthesia  Repeat cesarean section Nursery Course was complicated by discovery of pan-hypopituitarism Growth and Development was recalled as  by history normal  Behavior History hyperactive, low frustration tolerance, explosive behavior  Surgical History Procedure Laterality Date  . EYE SURGERY     x2  . TONSILLECTOMY     Family History family history includes Hypertension in his maternal grandmother. Family history is negative for migraines, seizures, intellectual disabilities, blindness, deafness, birth defects, chromosomal disorder, or autism.  Social History Socioeconomic History  . Marital status: Single  . Years of education:  107  . Highest education level:  High school certificate  Occupational History  .  Supervised employment at [employer]  Tobacco Use  . Smoking status: Never Smoker  . Smokeless tobacco: Never Used  Substance and Sexual Activity  . Alcohol use: No  . Drug use: No  . 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    He is not currently in school.    He lives with his step mom only.    He has four siblings.    Works at [employer] (has community care person that goes to work with him [facility])    [place] services via [employer]   No Known Allergies  Physical Exam BP 108/72   Pulse 80   Ht 5' 9" (1.753 m)   Wt 216 lb 12.8 oz (98.3 kg)   BMI 32.02 kg/m   General: alert, well developed, well nourished, in no acute distress, black hair, brown eyes, right handed Head: normocephalic, no dysmorphic features Ears, Nose and Throat: Otoscopic: tympanic membranes normal;  pharynx: oropharynx is pink without exudates or tonsillar hypertrophy Neck: supple, full range of motion, no cranial or cervical bruits Respiratory: auscultation clear Cardiovascular: no murmurs, pulses are normal Musculoskeletal: no skeletal deformities or apparent scoliosis Skin: no rashes or neurocutaneous lesions  Neurologic Exam  Mental Status: alert; oriented to person; knowledge is below normal for age; language is adequate for naming objects and following commands Cranial Nerves: visual fields are full to double simultaneous stimuli; extraocular movements are full and conjugate; pupils are round reactive to light; funduscopic examination shows sharp disc margins with normal vessels; symmetric facial strength; midline tongue and uvula; air conduction is greater than bone conduction bilaterally Motor: normal strength, tone and mass; good fine motor movements; no pronator drift on the right, mild weakness and clumsiness on the left, no pronator drift Sensory: intact responses to cold, vibration, proprioception and stereognosis bilaterally  Coordination: good finger-to-nose, rapid repetitive alternating movements and finger apposition Gait and Station: normal gait  and station: patient is able to walk on heels, toes and tandem without difficulty; balance is adequate; Romberg exam is negative; Gower response is negative Reflexes: symmetric and diminished bilaterally; no clonus; bilateral flexor plantar responses  Assessment 1.  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 2.  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  Unless his mother dates his problems with thinking to the time when he was ill with corona which extended over a period of a few weeks.  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  Plan I am not certain how best to help him.  I see no change in his examination in comparison with 2019 therefore do not think that there would be much benefit to performing an MRI of his brain.  If indeed this is related to Covid, it could be a long-haul syndrome.  I would like to have him evaluated in our long-haul clinic to see if any other work-up and/or treatment would be useful.  Greater than 50% of a 25-minute visit was spent in counseling and coordination of care concerning his encephalopathy and discussing the need for him to be seen by people who have witnessed symptoms associated with a long-haul syndrome.  He will return to see me as needed.   Medication List  TAKE these medications   acetaminophen 500 MG tablet Commonly known as: TYLENOL Take 1,000 mg by mouth every 6 (six) hours as needed for mild pain, fever or headache.   B-D 3CC LUER-LOK SYR 22GX1-1/2 22G X 1-1/2" 3 ML Misc Generic drug: SYRINGE-NEEDLE (DISP) 3 ML USE TO INJECT TESTOSTERONE   blood glucose meter kit and supplies Kit Dispense based on patient and insurance preference. Use up to four times daily as directed. (FOR ICD-9 250.00, 250.01).   cloNIDine 0.1 MG tablet Commonly known as: CATAPRES Take by mouth.   cyclobenzaprine 5 MG tablet Commonly known as: FLEXERIL Take 1-2 tablets (5-10 mg total) by mouth 2 (two) times daily as needed for muscle spasms.   dexmethylphenidate 5 MG tablet Commonly known as: FOCALIN Take 1 tablet (5 mg total) by mouth 2 (two) times daily.   GLUCAGON EMERGENCY IJ Inject 1 mg as directed as needed (blood sugar).   levothyroxine 125 MCG tablet Commonly known as: SYNTHROID Take 1 tablet (125 mcg total) by mouth daily at 6 (six) AM.   levothyroxine 137 MCG tablet Commonly known as: SYNTHROID Take 137 mcg by mouth every morning.   naproxen 500 MG tablet Commonly known as: NAPROSYN Take 1 tablet (500 mg  total) by mouth 2 (two) times daily.    The medication list was reviewed and reconciled. All changes or newly prescribed medications were explained.  A complete medication list was provided to the patient/caregiver.  Jodi Geralds MD

## 2019-08-15 ENCOUNTER — Ambulatory Visit: Payer: Medicaid Other | Admitting: Licensed Clinical Social Worker

## 2019-08-15 ENCOUNTER — Other Ambulatory Visit: Payer: Self-pay

## 2019-08-15 DIAGNOSIS — Z636 Dependent relative needing care at home: Secondary | ICD-10-CM

## 2019-08-15 NOTE — Chronic Care Management (AMB) (Signed)
Clinical Social Work  Care Management referral   08/15/2019 Name: Sean Kim MRN: [id number] DOB: 08/17/2000 Sean Kim is a 19 y.o. year old male who is a primary care patient of Caroline More, DO   LCSW reached out to [place]'s mother today by phone to assess needs and barriers with connecting to resources provided for mental health support.   Assessment: Mom has not been able to review or contact any of the providers previously discussed.  Intervention: Patient does not require or desire additional follow up. Assessment of needs and barriers completed; Patient's  mother still has information and resources previously provided. Plan:  1.  Mom will review mental health providers and decide which one will meet patient's needs 2.  Mom will contact LCSW if needed  Advance Directive Status: not addressed during this encounter previously discussed guardianship process and provided paper work. SDOH (Social Determinants of Health)  No needs identified   Goals Addressed            This Visit's Progress   . mental health support   Not on track    [place] (see longitudinal plan of care for additional care plan information)  Current Barriers & progress:  . Patient with ADHD and cogitative deficits Mom acknowledges deficits with connecting to mental health provider for counseling and possible medication evaluation.  . Patient is experiencing symptoms of depression and does not like the way his current medication makes him feel.     . Patient and caregiver needs Support, Education, and Care Coordination in order to meet unmet mental health needs  . Patient is doing better with taking focalin for ADHD when father ask him to take it.  However he does not like taking it due to the way it makes him feel . Mom has not been able to F/U on information and resources previously discussed Clinical Social Work Goal(s):  Marland Kitchen Over the next 30 days, LCSW will work with patient and  caregiver until connected for ongoing counseling and medication management.  . Over the next 30 days, patient will demonstrate improved adherence to prescribed treatment plan for ADHD as evidenced report by mother.  Interventions:  . Assessed barriers with connecting to resources   . Discussed several options for long term counseling and medication management based on need and insurance. Provided patient's mother with psychiatry and psychological resources to review and decide which will meet patient's needs ( [facility],  [facility], [facility] and  [facility]) . Other interventions include: Solution focus and task centered Patient Self Care Activities & Deficits:  . Patient is unable to independently navigate community resource options without care coordination support . Patient's mother is motivated for treatment and getting patient the help he needs . Mom will review information and make a selection will contact LCSW if needed Please see past updates related to this goal by clicking on the "Past Updates" button in the selected goal        Outpatient Encounter Medications as of 08/15/2019  Medication Sig  . acetaminophen (TYLENOL) 500 MG tablet Take 1,000 mg by mouth every 6 (six) hours as needed for mild pain, fever or headache.  . B-D 3CC LUER-LOK SYR 22GX1-1/2 22G X 1-1/2" 3 ML MISC USE TO INJECT TESTOSTERONE  . blood glucose meter kit and supplies KIT Dispense based on patient and insurance preference. Use up to four times daily as directed. (FOR ICD-9 250.00, 250.01).  Marland Kitchen  cloNIDine (CATAPRES) 0.1 MG tablet Take by mouth.  . cyclobenzaprine (FLEXERIL) 5 MG tablet Take 1-2 tablets (5-10 mg total) by mouth 2 (two) times daily as needed for muscle spasms.  Marland Kitchen dexmethylphenidate (FOCALIN) 5 MG tablet Take 1 tablet (5 mg total) by mouth 2 (two) times daily.  . Glucagon, rDNA, (GLUCAGON EMERGENCY IJ) Inject 1 mg as directed  as needed (blood sugar).   Marland Kitchen levothyroxine (SYNTHROID) 125 MCG tablet Take 1 tablet (125 mcg total) by mouth daily at 6 (six) AM.  . levothyroxine (SYNTHROID) 137 MCG tablet Take 137 mcg by mouth every morning.  . naproxen (NAPROSYN) 500 MG tablet Take 1 tablet (500 mg total) by mouth 2 (two) times daily.   No facility-administered encounter medications on file as of 08/15/2019.   Review of patient status, including review of consultants reports, relevant laboratory and other test results, and collaboration with appropriate care team members and the patient's provider was performed as part of comprehensive patient evaluation and provision of care management services.    Casimer Lanius, LCSW Care Management Coordinator [place] [facility] [place]   [phone] 3:13 PM

## 2019-09-10 ENCOUNTER — Other Ambulatory Visit: Payer: Self-pay

## 2019-09-10 ENCOUNTER — Ambulatory Visit (INDEPENDENT_AMBULATORY_CARE_PROVIDER_SITE_OTHER): Payer: Medicaid Other

## 2019-09-10 ENCOUNTER — Encounter (HOSPITAL_COMMUNITY): Payer: Self-pay

## 2019-09-10 ENCOUNTER — Ambulatory Visit (HOSPITAL_COMMUNITY)
Admission: EM | Admit: 2019-09-10 | Discharge: 2019-09-10 | Disposition: A | Discharge status: Home / Self Care | Payer: Medicaid Other | Attending: Urgent Care | Admitting: Urgent Care

## 2019-09-10 DIAGNOSIS — S93402A Sprain of unspecified ligament of left ankle, initial encounter: Secondary | ICD-10-CM

## 2019-09-10 DIAGNOSIS — M25572 Pain in left ankle and joints of left foot: Secondary | ICD-10-CM | POA: Diagnosis not present

## 2019-09-10 MED ORDER — NAPROXEN 500 MG PO TABS
500.0000 mg | ORAL_TABLET | Freq: Two times a day (BID) | ORAL | 0 refills | Status: DC
Start: 2019-09-10 — End: 2020-01-26

## 2019-09-10 NOTE — ED Triage Notes (Signed)
Patient is here today with mother, Markham Jordan, with complaints of left ankle pain. 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

## 2019-09-10 NOTE — ED Provider Notes (Signed)
[name]    CSN: [id number] Arrival date & time: 09/10/19  [id number]      History   Chief Complaint Chief Complaint  Patient presents with  . Ankle Pain    HPI Sean Kim is a 19 y.o. male.   Sean Kim presents with complaints of left ankle pain after a fall yesterday while at work. Tripped while refilling an ice machine at work. Pain to ankle since, worse with weight bearing. Today feels worse. Denies any previous injury to the left ankle or foot. Some decreased sensation to left lateral ankle. Hasn't taken any medications for pain. Ambulatory.    ROS per HPI, negative if not otherwise mentioned.      Past Medical History:  Diagnosis Date  . ADHD   . Hypoglycemia   . MR (mental retardation)   . 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 Seizures ([place])   . Stroke ([place])   . Thyroid disease     Patient Active Problem List   Diagnosis Date Noted  . Caregiver burden 07/05/2019  . History of attempted suicide 07/05/2019  . Constipation 11/12/2018  . Abnormal EKG 11/12/2018  . Encephalopathy due to COVID-19 virus 10/18/2018  . Elevated alkaline phosphatase level 05/29/2018  . Routine adult health maintenance 10/31/2017  . Healthy adult on routine physical examination 10/31/2017  . 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([place])   . Cognitive deficits   . Intermittent left side weakness   . Other cerebral infarction ([place])   . 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 ADHD 07/29/2017  . Hypoglycemia 07/23/2017    Past Surgical History:  Procedure Laterality Date  . EYE SURGERY     x2  . TONSILLECTOMY         Home Medications    Prior to Admission medications   Medication Sig Start Date End Date Taking? Authorizing Provider  acetaminophen (TYLENOL) 500 MG tablet Take 1,000 mg by mouth every 6 (six) hours as needed for mild pain, fever or headache.    [provider]  B-D 3CC LUER-LOK SYR 22GX1-1/2 22G X 1-1/2" 3 ML MISC USE TO INJECT TESTOSTERONE 08/09/17   [provider]  blood glucose meter kit and supplies KIT Dispense based on patient and insurance preference. Use up to four times daily as directed. (FOR ICD-9 250.00, 250.01). 08/03/17   Sherene Sires, DO  cloNIDine (CATAPRES) 0.1 MG tablet Take by mouth. 02/08/16   [provider]  cyclobenzaprine (FLEXERIL) 5 MG tablet Take 1-2 tablets (5-10 mg total) by mouth 2 (two) times daily as needed for muscle spasms. 08/07/19   Wieters, Hallie C, PA-C  dexmethylphenidate (FOCALIN) 5 MG tablet Take 1 tablet (5 mg total) by mouth 2 (two) times daily. 03/11/19   Caroline More, DO  Glucagon, rDNA, (GLUCAGON EMERGENCY IJ) Inject 1 mg as directed as needed (blood sugar).     [provider]  levothyroxine (SYNTHROID) 125 MCG tablet Take 1 tablet (125 mcg total) by mouth daily at 6 (six) AM. 11/09/18   Mullis, Kiersten P, DO  levothyroxine (SYNTHROID) 137 MCG tablet Take 137 mcg by mouth every morning. 03/13/19   [provider]  naproxen (NAPROSYN) 500 MG tablet Take 1 tablet (500 mg total) by mouth 2 (two) times daily. 09/10/19   Zigmund Gottron, NP    Family History Family History  Problem Relation Age of Onset  . Hypertension Maternal Grandmother     Social History Social History   Tobacco Use  . Smoking status: Never Smoker  . Smokeless  tobacco: Never Used  Vaping Use  . Vaping Use: Never used  Substance Use Topics  . Alcohol use: No  . Drug use: No     Allergies   Patient has no known allergies.   Review of Systems Review of Systems   Physical Exam Triage Vital Signs ED Triage Vitals  Enc Vitals Group     BP 09/10/19 1040 113/74     Pulse Rate 09/10/19 1040 75     Resp 09/10/19 1040 16     Temp 09/10/19 1040 98.8 F (37.1 C)     Temp Source 09/10/19 1040 Oral     SpO2 09/10/19 1040 97 %     Weight --      Height --      Head Circumference --      Peak Flow --      Pain Score 09/10/19 1042 9     Pain Loc --      Pain Edu? --      Excl. in [place]? --     No data found.  Updated Vital Signs BP 113/74 (BP Location: Right Arm)   Pulse 75   Temp 98.8 F (37.1 C) (Oral)   Resp 16   SpO2 97%    Physical Exam Constitutional:      Appearance: He is well-developed.  Cardiovascular:     Rate and Rhythm: Normal rate.  Pulmonary:     Effort: Pulmonary effort is normal.  Musculoskeletal:     Left ankle: No swelling. Tenderness present over the lateral malleolus and medial malleolus.     Comments: Left ankle with medial and lateral bony tenderness as well as soft tissue tenderness; pain with dorsiflexion; strong pulse; minimal swelling or redness; foot WNL  Skin:    General: Skin is warm and dry.  Neurological:     Mental Status: He is alert and oriented to person, place, and time.      UC Treatments / Results  Labs (all labs ordered are listed, but only abnormal results are displayed) Labs Reviewed - No data to display  EKG   Radiology DG Ankle Complete Left  Result Date: 09/10/2019 CLINICAL DATA:  19 year old who injured the LEFT ankle at work yesterday, fell while filling an ice machine. Pain anteriorly. Initial encounter. EXAM: LEFT ANKLE COMPLETE - 3+ VIEW COMPARISON:  None. FINDINGS: No evidence of acute fracture or dislocation. Recently closed physes in the distal tibia and distal fibula which accounts for the linear sclerosis. Anatomically aligned ankle joint with a well-preserved joint space. No visible joint effusion. IMPRESSION: No acute osseous abnormality. Electronically Signed   By: Evangeline Dakin M.D.   On: 09/10/2019 11:41    Procedures Procedures (including critical care time)  Medications Ordered in UC Medications - No data to display  Initial Impression / Assessment and Plan / UC Course  I have reviewed the triage vital signs and the nursing notes.  Pertinent labs & imaging results that were available during my care of the patient were reviewed by me and considered in my medical decision making (see chart  for details).     Films reassuring. Ambulatory. Consistent with ankle sprain. Brace provided and pain management and course of injury discussed. Patient and mother verbalized understanding and agreeable to plan.   Final Clinical Impressions(s) / UC Diagnoses   Final diagnoses:  Sprain of left ankle, unspecified ligament, initial encounter     Discharge Instructions     Your xray is normal today. There are  no indications of any broken bones.  Your exam is consistent with an ankle sprain.  Ice, elevation, naproxen twice a day (with food) to help with pain.  Ankle brace as needed to help with pain.  Activity as tolerated.  If no improvement or any worsening of symptoms over the next month I would recommend following up with sports medicine.     ED Prescriptions    Medication Sig Dispense Auth. Provider   naproxen (NAPROSYN) 500 MG tablet Take 1 tablet (500 mg total) by mouth 2 (two) times daily. 30 tablet Zigmund Gottron, NP     PDMP not reviewed this encounter.   Zigmund Gottron, NP 09/10/19 1158

## 2019-09-10 NOTE — Discharge Instructions (Signed)
Your xray is normal today. There are no indications of any broken bones.  Your exam is consistent with an ankle sprain.  Ice, elevation, naproxen twice a day (with food) to help with pain.  Ankle brace as needed to help with pain.  Activity as tolerated.  If no improvement or any worsening of symptoms over the next month I would recommend following up with sports medicine.

## 2019-10-08 ENCOUNTER — Other Ambulatory Visit: Payer: Self-pay

## 2019-10-08 ENCOUNTER — Encounter: Payer: Self-pay | Admitting: Family Medicine

## 2019-10-08 ENCOUNTER — Ambulatory Visit (INDEPENDENT_AMBULATORY_CARE_PROVIDER_SITE_OTHER): Payer: Medicaid Other | Admitting: Family Medicine

## 2019-10-08 DIAGNOSIS — E162 Hypoglycemia, unspecified: Secondary | ICD-10-CM

## 2019-10-08 DIAGNOSIS — Z915 Personal history of self-harm: Secondary | ICD-10-CM

## 2019-10-08 DIAGNOSIS — F909 Attention-deficit hyperactivity disorder, unspecified type: Secondary | ICD-10-CM | POA: Diagnosis not present

## 2019-10-08 DIAGNOSIS — Z9151 Personal history of suicidal behavior: Secondary | ICD-10-CM

## 2019-10-08 NOTE — Assessment & Plan Note (Addendum)
-  After extensive discussions with both the patient alone and mom and patient, patient denies current suicidal ideations and plan.  -States that he will improve on expressing his anger in appropriate ways other than stating that he wants to harm himself when he doesn't mean it -Emphasized to patient and mom to go to the ED if suicidal ideations persist again -Shared information regarding the [facility] to mom for another potential source -Mom states that patient has first therapy session on 10/21/2019, encouraged patient to attend and participate  -Follow up with neuropsychiatrist regarding new med Skipper Cliche  -Patient lives with parents and siblings, continue to closely monitor  -Follow up in 1 year, advised to contact us with any concerns or if presence of suicidal ideations

## 2019-10-08 NOTE — Assessment & Plan Note (Signed)
-  Continue glucagon as needed -Continue to monitor

## 2019-10-08 NOTE — Assessment & Plan Note (Signed)
-  Continue dexmethylphenidate, improving focus -Follow up with neuropsychiatrist for new medication, Skipper Cliche

## 2019-10-08 NOTE — Progress Notes (Signed)
SUBJECTIVE:   CHIEF COMPLAINT / HPI:  Well check  PERTINENT  PMH / PSH:   History of suicide attempts Patient has history of multiple suicide attempts, occurred at the age of 56-14. Patient had tried to cut and stab himself multiple times. According to mom and previous note, he also tried to enter a busy street. Patient currently denies suicidal or homicidal ideations, also denies a current plan to harm himself when I had a one-on-one discussion with him. According to PHQ-9, patient circled feeling depressed and having little interest in things almost every day. He denies any depression or sadness, states that his mood is happy and excited about the new job that he started. Reports that he has had an easy transition from finishing high school to starting his job at [employer]. He states that people at work are very supportive but does not want to work there for long as he wants to start college soon. Not sure what he wants to study. When having a separate discussion with the patient along with mom, mom states that he feels angry at home at times. According to mom, she states that patient has little pleasure in doing things and seems occasionally depressed. When asked to take out the trash, he states that he would rather be dead. This is concerning for her, but patient states that he says this out of anger and does not have active suicidal ideations. Mother reports that this occurs frequently where he states he wants to harm himself just to get a reaction out of her although patient denies those ideations.  Mom states that he called his work recently and stated that he did not feel like coming so he missed a few days at his new job. Mom denies the presence of guns in the home. Patient denies any alcohol or tobacco use. Mom states that patient is usually always home with either his parents or siblings.   ADHD Patient currently compliant on dexmethylphenidate, states that it helps him focus. Denies any  associated complications. Recently started on Straterra, by a neuropsychiatrist that he follows up with, for improved mood. Mom and patient both state that he experienced some abdominal pain when the medication was first started on 09/23/2019 but now has resolved. He has a follow up appointment to assess the medication later this month.   Hypoglycemia  Denies noticing any hypoglycemic episodes but does not check blood sugars regularly. States that he takes glucagon as needed. Denies any fatigue, blurred vision or dizziness. Also denies dyspnea and chest pain.  OBJECTIVE:   BP 112/78   Pulse 70   Ht 5\' 9"  (1.753 m)   Wt 213 lb (96.6 kg)   BMI 31.45 kg/m   General: Patient in no acute distress. Cardio: regular rate and rhythm, no murmurs or gallops noted Resp: lungs clear to auscultation bilaterally, no rales or rhonchi noted Abodmen: nontender, active bowel sounds present Neuro: normal gait, able to stand and walk and tip toes, cognitive impairment present Psych: denies current suicidal or homicidal ideations, denies current plan in place. Denies anxiety, sadness and depression.  ASSESSMENT/PLAN:   History of attempted suicide -After extensive discussions with both the patient alone and mom and patient, patient denies current suicidal ideations and plan.  -States that he will improve on expressing his anger in appropriate ways other than stating that he wants to harm himself when he doesn't mean it -Emphasized to patient and mom to go to the ED if suicidal ideations persist  again -Shared information regarding the [facility] to mom for another potential source -Mom states that patient has first therapy session on 10/21/2019, encouraged patient to attend and participate  -Follow up with neuropsychiatrist regarding new med Skipper Cliche  -Patient lives with parents and siblings, continue to closely monitor  -Follow up in 1 year, advised to contact us with any concerns or if  presence of suicidal ideations  ADHD -Continue dexmethylphenidate, improving focus -Follow up with neuropsychiatrist for new medication, Straterra  Hypoglycemia -Continue glucagon as needed -Continue to monitor     Donney Dice, [place]

## 2019-10-08 NOTE — Patient Instructions (Signed)
It was so great meeting you today!   Today we discussed other ways to express your anger instead of using words that indicate harming yourself. Make sure to utilize your support system of your family. It always helps to talk things out with someone. If you ever have thoughts or a plan of harming yourself then please go to the ED.   Please go to your first therapy appointment on 10/21/2019, I think it will be very helpful to have a discussion with someone. Please also follow up with your neuropsychiatrist to assess the efficacy of your recently started medication, Straterra.   Also, as discussed today, the [facility] is a great resource if needed.  Follow up in about 1 year but please contact us if you have any concerns or questions, we want to see you earlier if anything changes. Thank you for allowing me to be a part of your medical care!

## 2020-01-26 ENCOUNTER — Ambulatory Visit
Admission: EM | Admit: 2020-01-26 | Discharge: 2020-01-26 | Disposition: A | Discharge status: Home / Self Care | Payer: Medicaid Other | Attending: Family Medicine | Admitting: Family Medicine

## 2020-01-26 ENCOUNTER — Ambulatory Visit (INDEPENDENT_AMBULATORY_CARE_PROVIDER_SITE_OTHER): Payer: Medicaid Other

## 2020-01-26 DIAGNOSIS — X501XXA Overexertion from prolonged static or awkward postures, initial encounter: Secondary | ICD-10-CM | POA: Diagnosis not present

## 2020-01-26 DIAGNOSIS — M25572 Pain in left ankle and joints of left foot: Secondary | ICD-10-CM

## 2020-01-26 DIAGNOSIS — S93402A Sprain of unspecified ligament of left ankle, initial encounter: Secondary | ICD-10-CM

## 2020-01-26 NOTE — ED Provider Notes (Signed)
[facility]    CSN: [id number] Arrival date & time: 01/26/20  1005      History   Chief Complaint Chief Complaint  Patient presents with  . Ankle Pain    HPI Sean Kim is a 18 y.o. male.  Patient twisted left ankle 2 days ago while dancing. Has some swelling and walks with a moderate limp.  HPI  Past Medical History:  Diagnosis Date  . ADHD   . Hypoglycemia   . MR (mental retardation)   . Pituitary abnormality ([place])   . Seizures ([place])   . Stroke ([place])   . Thyroid disease     Patient Active Problem List   Diagnosis Date Noted  . Caregiver burden 07/05/2019  . History of attempted suicide 07/05/2019  . Constipation 11/12/2018  . Abnormal EKG 11/12/2018  . Encephalopathy due to COVID-19 virus 10/18/2018  . Elevated alkaline phosphatase level 05/29/2018  . Routine adult health maintenance 10/31/2017  . Healthy adult on routine physical examination 10/31/2017  . 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([place])   . Cognitive deficits   . Intermittent left side weakness   . 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 ADHD 07/29/2017  . Hypoglycemia 07/23/2017    Past Surgical History:  Procedure Laterality Date  . EYE SURGERY     x2  . TONSILLECTOMY         Home Medications    Prior to Admission medications   Medication Sig Start Date End Date Taking? Authorizing Provider  acetaminophen (TYLENOL) 500 MG tablet Take 1,000 mg by mouth every 6 (six) hours as needed for mild pain, fever or headache.    [provider]  dexmethylphenidate (FOCALIN) 5 MG tablet Take 1 tablet (5 mg total) by mouth 2 (two) times daily. 03/11/19   Caroline More, DO  Glucagon, rDNA, (GLUCAGON EMERGENCY IJ) Inject 1 mg as directed as needed (blood sugar).     [provider]  levothyroxine (SYNTHROID) 125 MCG tablet Take 1 tablet (125 mcg total) by mouth daily at 6 (six) AM. 11/09/18   Mullis, Kiersten P, DO  levothyroxine (SYNTHROID) 137 MCG tablet  Take 137 mcg by mouth every morning. 03/13/19   [provider]    Family History Family History  Problem Relation Age of Onset  . Hypertension Maternal Grandmother     Social History Social History   Tobacco Use  . Smoking status: Never Smoker  . Smokeless tobacco: Never Used  Vaping Use  . Vaping Use: Never used  Substance Use Topics  . Alcohol use: No  . Drug use: No     Allergies   Patient has no known allergies.   Review of Systems Review of Systems  Musculoskeletal: Positive for arthralgias.       Left ankle pain  All other systems reviewed and are negative.    Physical Exam Triage Vital Signs ED Triage Vitals  Enc Vitals Group     BP 01/26/20 1041 115/77     Pulse Rate 01/26/20 1041 81     Resp 01/26/20 1041 18     Temp 01/26/20 1041 98.2 F (36.8 C)     Temp Source 01/26/20 1041 Oral     SpO2 01/26/20 1041 97 %     Weight --      Height --      Head Circumference --      Peak Flow --      Pain Score 01/26/20 1042 9  Pain Loc --      Pain Edu? --      Excl. in [place]? --    No data found.  Updated Vital Signs BP 115/77 (BP Location: Left Arm)   Pulse 81   Temp 98.2 F (36.8 C) (Oral)   Resp 18   SpO2 97%   Visual Acuity Right Eye Distance:   Left Eye Distance:   Bilateral Distance:    Right Eye Near:   Left Eye Near:    Bilateral Near:     Physical Exam Vitals and nursing note reviewed.  Constitutional:      Appearance: Normal appearance.  HENT:     Head: Normocephalic.  Cardiovascular:     Rate and Rhythm: Normal rate and regular rhythm.  Pulmonary:     Effort: Pulmonary effort is normal.     Breath sounds: Normal breath sounds.  Musculoskeletal:     Comments: Left ankle: Mild swelling on the lateral aspect. Strength appears normal. Minimal tenderness over the lateral malleolus. No tenderness on palpation of the metatarsals.  Neurological:     Mental Status: He is alert.      UC Treatments / Results   Labs (all labs ordered are listed, but only abnormal results are displayed) Labs Reviewed - No data to display  EKG   Radiology DG Ankle Complete Left  Result Date: 01/26/2020 CLINICAL DATA:  Left ankle pain, inversion injury EXAM: LEFT ANKLE COMPLETE - 3+ VIEW COMPARISON:  09/10/2019 FINDINGS: There is no evidence of fracture, dislocation, or joint effusion. There is no evidence of arthropathy or other focal bone abnormality. Soft tissues are unremarkable. IMPRESSION: Negative. Electronically Signed   By: Rolm Baptise M.D.   On: 01/26/2020 10:57    Procedures Procedures (including critical care time)  Medications Ordered in UC Medications - No data to display  Initial Impression / Assessment and Plan / UC Course  I have reviewed the triage vital signs and the nursing notes.  Pertinent labs & imaging results that were available during my care of the patient were reviewed by me and considered in my medical decision making (see chart for details).     Sprain left ankle. Will treat with Aircast and crutches with no weightbearing and gradual increase weightbearing Final Clinical Impressions(s) / UC Diagnoses   Final diagnoses:  None   Discharge Instructions   None    ED Prescriptions    None     PDMP not reviewed this encounter.   Wardell Honour, MD 01/26/20 1106

## 2020-01-26 NOTE — ED Triage Notes (Signed)
Pt states he twisted his lt ankle on Saturday when dancing. States had swelling immediately and added ice.

## 2020-07-08 ENCOUNTER — Encounter (INDEPENDENT_AMBULATORY_CARE_PROVIDER_SITE_OTHER): Payer: Self-pay

## 2020-08-26 ENCOUNTER — Ambulatory Visit: Payer: Medicaid Other

## 2021-01-16 ENCOUNTER — Telehealth: Payer: Medicaid Other | Admitting: Physician Assistant

## 2021-01-16 DIAGNOSIS — R112 Nausea with vomiting, unspecified: Secondary | ICD-10-CM | POA: Diagnosis not present

## 2021-01-16 MED ORDER — ONDANSETRON HCL 4 MG PO TABS: 4.0000 mg | ORAL_TABLET | Freq: Three times a day (TID) | ORAL | 0 refills | Status: DC | PRN

## 2021-01-16 NOTE — Progress Notes (Signed)

## 2021-03-25 ENCOUNTER — Other Ambulatory Visit: Payer: Self-pay

## 2021-03-25 ENCOUNTER — Telehealth: Payer: Medicaid Other | Admitting: Emergency Medicine

## 2021-03-25 ENCOUNTER — Emergency Department (HOSPITAL_COMMUNITY)
Admission: EM | Admit: 2021-03-25 | Discharge: 2021-03-26 | Disposition: A | Discharge status: Home / Self Care | Payer: Medicaid Other | Attending: Emergency Medicine | Admitting: Emergency Medicine

## 2021-03-25 ENCOUNTER — Emergency Department (HOSPITAL_COMMUNITY): Payer: Medicaid Other

## 2021-03-25 ENCOUNTER — Encounter (HOSPITAL_COMMUNITY): Payer: Self-pay | Admitting: Emergency Medicine

## 2021-03-25 DIAGNOSIS — R109 Unspecified abdominal pain: Secondary | ICD-10-CM | POA: Diagnosis not present

## 2021-03-25 DIAGNOSIS — U071 COVID-19: Secondary | ICD-10-CM | POA: Diagnosis not present

## 2021-03-25 DIAGNOSIS — R197 Diarrhea, unspecified: Secondary | ICD-10-CM

## 2021-03-25 DIAGNOSIS — R112 Nausea with vomiting, unspecified: Secondary | ICD-10-CM

## 2021-03-25 DIAGNOSIS — Z8616 Personal history of COVID-19: Secondary | ICD-10-CM | POA: Diagnosis not present

## 2021-03-25 HISTORY — DX: COVID-19: U07.1

## 2021-03-25 LAB — CBC WITH DIFFERENTIAL/PLATELET
Abs Immature Granulocytes: 0.01 10*3/uL (ref 0.00–0.07)
Basophils Absolute: 0 10*3/uL (ref 0.0–0.1)
Basophils Relative: 1 %
Eosinophils Absolute: 0.1 10*3/uL (ref 0.0–0.5)
Eosinophils Relative: 2 %
HCT: 37.4 % — ABNORMAL LOW (ref 39.0–52.0)
Hemoglobin: 12.4 g/dL — ABNORMAL LOW (ref 13.0–17.0)
Immature Granulocytes: 0 %
Lymphocytes Relative: 45 %
Lymphs Abs: 2.6 10*3/uL (ref 0.7–4.0)
MCH: 28.3 pg (ref 26.0–34.0)
MCHC: 33.2 g/dL (ref 30.0–36.0)
MCV: 85.4 fL (ref 80.0–100.0)
Monocytes Absolute: 0.5 10*3/uL (ref 0.1–1.0)
Monocytes Relative: 9 %
Neutro Abs: 2.4 10*3/uL (ref 1.7–7.7)
Neutrophils Relative %: 43 %
Platelets: 262 10*3/uL (ref 150–400)
RBC: 4.38 MIL/uL (ref 4.22–5.81)
RDW: 15.8 % — ABNORMAL HIGH (ref 11.5–15.5)
WBC: 5.6 10*3/uL (ref 4.0–10.5)
nRBC: 0 % (ref 0.0–0.2)

## 2021-03-25 LAB — COMPREHENSIVE METABOLIC PANEL
ALT: 16 U/L (ref 0–44)
AST: 41 U/L (ref 15–41)
Albumin: 4.9 g/dL (ref 3.5–5.0)
Alkaline Phosphatase: 57 U/L (ref 38–126)
Anion gap: 12 (ref 5–15)
BUN: 10 mg/dL (ref 6–20)
CO2: 21 mmol/L — ABNORMAL LOW (ref 22–32)
Calcium: 9.7 mg/dL (ref 8.9–10.3)
Chloride: 101 mmol/L (ref 98–111)
Creatinine, Ser: 1.07 mg/dL (ref 0.61–1.24)
GFR, Estimated: >60 mL/min (ref >60)
Glucose, Bld: 99 mg/dL (ref 70–99)
Potassium: 3.6 mmol/L (ref 3.5–5.1)
Sodium: 134 mmol/L — ABNORMAL LOW (ref 135–145)
Total Bilirubin: 1.1 mg/dL (ref 0.3–1.2)
Total Protein: 9 g/dL — ABNORMAL HIGH (ref 6.5–8.1)

## 2021-03-25 LAB — RESP PANEL BY RT-PCR (FLU A&B, COVID) ARPGX2
Influenza A by PCR: NEGATIVE
Influenza B by PCR: NEGATIVE
SARS Coronavirus 2 by RT PCR: POSITIVE — AB

## 2021-03-25 LAB — TROPONIN I (HIGH SENSITIVITY): Troponin I (High Sensitivity): 7 ng/L (ref <18)

## 2021-03-25 LAB — MAGNESIUM: Magnesium: 2 mg/dL (ref 1.7–2.4)

## 2021-03-25 MED ORDER — MOLNUPIRAVIR EUA 200MG CAPSULE: 4.0000 | ORAL_CAPSULE | Freq: Two times a day (BID) | ORAL | 0 refills | Status: AC

## 2021-03-25 MED ORDER — ONDANSETRON 4 MG PO TBDP: 4.0000 mg | ORAL_TABLET | Freq: Three times a day (TID) | ORAL | 0 refills | Status: DC | PRN

## 2021-03-25 NOTE — ED Provider Triage Note (Signed)
Emergency Medicine Provider Triage Evaluation Note  Sean Kim , a 21 y.o. male  was evaluated in triage.  Patient has history of thyroid disease, pituitary abnormality, MR, CVA.  Pt complains of nausea, vomiting, diarrhea.  States his symptoms started yesterday.  Reports associated chest pain.  States that he had a positive home COVID-19 test.  Physical Exam  BP 112/84 (BP Location: Right Arm)    Pulse (!) 102    Temp 99.3 F (37.4 C) (Oral)    Resp 15    SpO2 100%  Gen:   Awake, no distress   Resp:  Normal effort  MSK:   Moves extremities without difficulty  Other:    Medical Decision Making  Medically screening exam initiated at 10:40 PM.  Appropriate orders placed.  Sean Kim was informed that the remainder of the evaluation will be completed by another provider, this initial triage assessment does not replace that evaluation, and the importance of remaining in the ED until their evaluation is complete.   Sean Sexton, PA-C 03/25/21 2241

## 2021-03-25 NOTE — ED Triage Notes (Signed)
Tested positive for Covid19 using home test kit yesterday with emesis and diarrhea.

## 2021-03-25 NOTE — Patient Instructions (Signed)
Based on what you shared with me, I feel your condition warrants further evaluation as soon as possible at an Emergency department.    NOTE: There will be NO CHARGE for this Visit   If you are having a true medical emergency please call 911.      Emergency Department-[facility]  Get Driving Directions  [phone]  [address]  [place], [place] [address]  Open 24/7/365      [facility] [address]  Get Driving Directions  [address]  [place], [place] [address]  Open 24/7/365    Emergency Department- [facility]  Get Driving Directions  [phone]  [address]  [place], [place] [address]  Open 24/7/365      [facility]  Get Driving Directions  [phone]  [address]  [place], [place] [address]  Open 24/7/365    [place]  Emergency Department- [place] [facility]  Get Driving Directions  [phone]  [address]  [place], [place] [address]  Open 24/7/365    [place]  Emergency Department- [facility] [place]  Get Driving Directions  [address]  [place], [place] [address]  Open 24/7/365    [place]  Emergency Department- [facility]  Get Driving Directions  [phone]  [address]  , [place] [address]  Open 24/7/365    

## 2021-03-25 NOTE — Progress Notes (Signed)
Virtual Visit Consent   Lowen Schrom, you are scheduled for a virtual visit with a [facility] provider today.     Just as with appointments in the office, your consent must be obtained to participate.  Your consent will be active for this visit and any virtual visit you may have with one of our providers in the next 365 days.     If you have a MyChart account, a copy of this consent can be sent to you electronically.  All virtual visits are billed to your insurance company just like a traditional visit in the office.    As this is a virtual visit, video technology does not allow for your provider to perform a traditional examination.  This may limit your provider's ability to fully assess your condition.  If your provider identifies any concerns that need to be evaluated in person or the need to arrange testing (such as labs, EKG, etc.), we will make arrangements to do so.     Although advances in technology are sophisticated, we cannot ensure that it will always work on either your end or our end.  If the connection with a video visit is poor, the visit may have to be switched to a telephone visit.  With either a video or telephone visit, we are not always able to ensure that we have a secure connection.     I need to obtain your verbal consent now.   Are you willing to proceed with your visit today?    Sean Kim has provided verbal consent on 03/25/2021 for a virtual visit (video or telephone).  History provided by mother 2/2 cognitive delay.   Montine Circle, PA-C   Date: 03/25/2021 7:02 PM   Virtual Visit via Video Note   I, Montine Circle, connected with  Sean Kim  ([id number], Mar 04, 2001) on 03/25/21 at  7:30 PM EST by a video-enabled telemedicine application and verified that I am speaking with the correct person using two identifiers.  Location: Patient: Virtual Visit Location Patient: Home Provider: Virtual Visit Location Provider: Home Office   I discussed the  limitations of evaluation and management by telemedicine and the availability of in person appointments. The patient expressed understanding and agreed to proceed.    History of Present Illness: Sean Kim is a 21 y.o. who identifies as a male who was assigned male at birth, and is being seen today for COVID 38.  Has extensive medical hx as listed below.  Mother is concerned that he may need to go to the ER for hydration and to have his labs checked.  She states he hasn't be drinking or eating and can't keep anything down.  States that he tested positive for COVID.  Mother says that his school called and told her he had been pooping on himself today and had been vomiting.  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 , Disp: , Rfl:    levothyroxine (SYNTHROID) 125 MCG tablet, Take 1 tablet (125 mcg total) by mouth daily at 6 (six) AM., Disp: 30 tablet, Rfl: 0   levothyroxine (SYNTHROID) 137 MCG tablet, Take  137 mcg by mouth every morning., Disp: , Rfl:    ondansetron (ZOFRAN) 4 MG tablet, Take 1 tablet (4 mg total) by mouth every 8 (eight) hours as needed for nausea or vomiting., Disp: 20 tablet, Rfl: 0  Observations/Objective: Patient is well-developed, well-nourished in no acute distress.  Resting comfortably  at home.  Head is normocephalic, atraumatic.  No labored breathing.  Speech is clear and coherent with logical content.  Patient is alert and oriented at baseline.    Assessment and Plan: 1. COVID-19  Redirect to ED for evaluation of COVID and dehydration.  Follow Up Instructions: I discussed the assessment and treatment plan with the patient. The patient was provided an opportunity to ask questions and all were answered. The patient agreed with the plan and demonstrated an understanding of the instructions.  A copy of instructions were sent to the patient via MyChart unless otherwise noted below.     The patient was advised to call back or seek an in-person evaluation if the symptoms worsen or if the condition fails to improve as anticipated.  Time:  I spent 11 minutes with the patient via telehealth technology discussing the above problems/concerns.    Montine Circle, PA-C

## 2021-03-26 LAB — TROPONIN I (HIGH SENSITIVITY): Troponin I (High Sensitivity): 6 ng/L (ref <18)

## 2021-03-26 MED ORDER — ONDANSETRON 4 MG PO TBDP
4.0000 mg | ORAL_TABLET | Freq: Once | ORAL | Status: AC
  Administered 2021-03-26: 4 mg via ORAL
  Filled 2021-03-26: qty 1

## 2021-03-26 NOTE — ED Provider Notes (Signed)
[facility] EMERGENCY DEPARTMENT Provider Note   CSN: [id number] Arrival date & time: 03/25/21  2032     History  Chief Complaint  Patient presents with   Covid+ / Emesis with Diarrhea    Sean Kim is a 21 y.o. male.  The history is provided by the patient.  Emesis Associated symptoms: abdominal pain, chills and diarrhea   Associated symptoms: no cough      Patient has a history of panhypopituitarism, brain injury Presents with vomiting and diarrhea. Patient was recently diagnosed with COVID-19 and since that has been had multiple episodes of vomiting diarrhea.  No significant cough.  He reports fevers and chills.  He has chest and abdominal pain at times Home Medications Prior to Admission medications   Medication Sig Start Date End Date Taking? Authorizing Provider  acetaminophen (TYLENOL) 500 MG tablet Take 1,000 mg by mouth every 6 (six) hours as needed for mild pain, fever or headache.    [provider]  dexmethylphenidate (FOCALIN) 5 MG tablet Take 1 tablet (5 mg total) by mouth 2 (two) times daily. 03/11/19   Caroline More, DO  Glucagon, rDNA, (GLUCAGON EMERGENCY IJ) Inject 1 mg as directed as needed (blood sugar).     [provider]  levothyroxine (SYNTHROID) 125 MCG tablet Take 1 tablet (125 mcg total) by mouth daily at 6 (six) AM. 11/09/18   Mullis, Kiersten P, DO  levothyroxine (SYNTHROID) 137 MCG tablet Take 137 mcg by mouth every morning. 03/13/19   [provider]  molnupiravir EUA (LAGEVRIO) 200 mg CAPS capsule Take 4 capsules (800 mg total) by mouth 2 (two) times daily for 5 days. 03/25/21 03/30/21  Montine Circle, PA-C  ondansetron (ZOFRAN) 4 MG tablet Take 1 tablet (4 mg total) by mouth every 8 (eight) hours as needed for nausea or vomiting. 01/16/21   Mar Daring, PA-C  ondansetron (ZOFRAN-ODT) 4 MG disintegrating tablet Take 1 tablet (4 mg total) by mouth every 8 (eight) hours as needed for nausea or  vomiting. 03/25/21   Montine Circle, PA-C      Allergies    Patient has no known allergies.    Review of Systems   Review of Systems  Constitutional:  Positive for chills.  Respiratory:  Negative for cough.   Gastrointestinal:  Positive for abdominal pain, diarrhea, nausea and vomiting. Negative for blood in stool.  All other systems reviewed and are negative.  Physical Exam Updated Vital Signs BP 101/79    Pulse 87    Temp 99.3 F (37.4 C) (Oral)    Resp 16    SpO2 100%  Physical Exam CONSTITUTIONAL: Well developed/well nourished HEAD: Normocephalic/atraumatic EYES: EOMI/PERRL ENMT: Mucous membranes moist, uvula midline, no angioedema or stridor NECK: supple no meningeal signs SPINE/BACK:entire spine nontender CV: S1/S2 noted, no murmurs/rubs/gallops noted LUNGS: Lungs are clear to auscultation bilaterally, no apparent distress ABDOMEN: soft, nontender, no rebound or guarding, bowel sounds noted throughout abdomen GU:no cva tenderness NEURO: Pt is awake/alert/appropriate, moves all extremitiesx4. EXTREMITIES: pulses normal/equal, full ROM SKIN: warm, color normal PSYCH: no abnormalities of mood noted, alert and oriented to situation  ED Results / Procedures / Treatments   Labs (all labs ordered are listed, but only abnormal results are displayed) Labs Reviewed  RESP PANEL BY RT-PCR (FLU A&B, COVID) ARPGX2 - Abnormal; Notable for the following components:      Result Value   SARS Coronavirus 2 by RT PCR POSITIVE (*)    All other components within normal limits  COMPREHENSIVE METABOLIC PANEL - Abnormal; Notable for the following components:   Sodium 134 (*)    CO2 21 (*)    Total Protein 9.0 (*)    All other components within normal limits  CBC WITH DIFFERENTIAL/PLATELET - Abnormal; Notable for the following components:   Hemoglobin 12.4 (*)    HCT 37.4 (*)    RDW 15.8 (*)    All other components within normal limits  MAGNESIUM  TROPONIN I (HIGH SENSITIVITY)   TROPONIN I (HIGH SENSITIVITY)    EKG EKG Interpretation  Date/Time:  Saturday March 26 2021 06:38:03 EST Ventricular Rate:  85 PR Interval:  155 QRS Duration: 120 QT Interval:  440 QTC Calculation: 524 R Axis:   67 Text Interpretation: Sinus rhythm IVCD, consider atypical RBBB T wave inversion No significant change since last tracing Confirmed by Ripley Fraise [phone]) on 03/26/2021 6:41:42 AM  Radiology DG Chest 2 View  Result Date: 03/25/2021 CLINICAL DATA:  Nausea, vomiting and diarrhea x1 day. EXAM: CHEST - 2 VIEW COMPARISON:  November 04, 2018 FINDINGS: The heart size and mediastinal contours are within normal limits. Both lungs are clear. The visualized skeletal structures are unremarkable. IMPRESSION: No active cardiopulmonary disease. Electronically Signed   By: Virgina Norfolk M.D.   On: 03/25/2021 23:15    Procedures Procedures    Medications Ordered in ED Medications  ondansetron (ZOFRAN-ODT) disintegrating tablet 4 mg (has no administration in time range)    ED Course/ Medical Decision Making/ A&P Clinical Course as of 03/26/21 0720  Sat Mar 26, 2021  0635 Spoke to mother of patient.  She reports has been having vomiting and diarrhea with COVID-19.  She is aware that prescriptions have been sent from his video visit.  She reports that he is been having significant diarrhea which has been unable to control.  Patient is overall well-appearing.  Will trial p.o. fluid [DW]    Clinical Course User Index [DW] Ripley Fraise, MD                           Medical Decision Making Risk Prescription drug management.   This patient presents to the ED for concern of COVID-19, this involves an extensive number of treatment options, and is a complaint that carries with it a high risk of complications and morbidity.   Comorbidities that complicate the patient evaluation: Patients presentation is complicated by their history of panhypopituitarism   Additional  history obtained: Additional history obtained from family and mother Records reviewed previous neurology notes reviewed  Lab Tests: I Ordered, and personally interpreted labs.  The pertinent results include: COVID-19  Imaging Studies ordered: I ordered imaging studies including X-ray chest I independently visualized and interpreted imaging which showed no acute findings I agree with the radiologist interpretation  Cardiac Monitoring: The patient was maintained on a cardiac monitor.  I personally viewed and interpreted the cardiac monitor which showed an underlying rhythm of:  sinus rhythm  Medicines ordered and prescription drug management: I ordered medication including Zofran for nausea Reevaluation of the patient after these medicines showed that the patient    improved    Reevaluation: After the interventions noted above, I reevaluated the patient and found that they have :improved  Complexity of problems addressed: Patients presentation is most consistent with  acute, uncomplicated illness      Disposition: After consideration of the diagnostic results and the patients response to treatment,  I feel that the patent would benefit  from discharge .   Patient is in no acute distress.  No hypoxia.  He is watching TV.  No active vomiting at this time He has no significant findings of dehydration .  He is safe for discharge home          Final Clinical Impression(s) / ED Diagnoses Final diagnoses:  Nausea vomiting and diarrhea  COVID-19    Rx / DC Orders ED Discharge Orders     None         Ripley Fraise, MD 03/26/21 [phone]

## 2021-03-26 NOTE — Discharge Instructions (Signed)
Be sure to continue taking p.o. fluids and to stay hydrated.  You have already been given prescriptions for COVID-19

## 2021-05-09 ENCOUNTER — Other Ambulatory Visit: Payer: Self-pay

## 2021-05-09 ENCOUNTER — Emergency Department (HOSPITAL_COMMUNITY)
Admission: EM | Admit: 2021-05-09 | Discharge: 2021-05-09 | Disposition: A | Discharge status: Home / Self Care | Payer: Medicaid Other | Attending: Emergency Medicine | Admitting: Emergency Medicine

## 2021-05-09 ENCOUNTER — Emergency Department (HOSPITAL_COMMUNITY): Payer: Medicaid Other

## 2021-05-09 DIAGNOSIS — D72829 Elevated white blood cell count, unspecified: Secondary | ICD-10-CM | POA: Diagnosis not present

## 2021-05-09 DIAGNOSIS — E860 Dehydration: Secondary | ICD-10-CM | POA: Diagnosis not present

## 2021-05-09 DIAGNOSIS — R112 Nausea with vomiting, unspecified: Secondary | ICD-10-CM | POA: Insufficient documentation

## 2021-05-09 DIAGNOSIS — R197 Diarrhea, unspecified: Secondary | ICD-10-CM | POA: Diagnosis not present

## 2021-05-09 DIAGNOSIS — R1084 Generalized abdominal pain: Secondary | ICD-10-CM | POA: Diagnosis not present

## 2021-05-09 DIAGNOSIS — R000 Tachycardia, unspecified: Secondary | ICD-10-CM | POA: Insufficient documentation

## 2021-05-09 DIAGNOSIS — R110 Nausea: Secondary | ICD-10-CM

## 2021-05-09 LAB — CBC WITH DIFFERENTIAL/PLATELET
Abs Immature Granulocytes: 0.04 10*3/uL (ref 0.00–0.07)
Basophils Absolute: 0 10*3/uL (ref 0.0–0.1)
Basophils Relative: 0 %
Eosinophils Absolute: 0 10*3/uL (ref 0.0–0.5)
Eosinophils Relative: 0 %
HCT: 38 % — ABNORMAL LOW (ref 39.0–52.0)
Hemoglobin: 12.6 g/dL — ABNORMAL LOW (ref 13.0–17.0)
Immature Granulocytes: 0 %
Lymphocytes Relative: 19 %
Lymphs Abs: 2.7 10*3/uL (ref 0.7–4.0)
MCH: 28.1 pg (ref 26.0–34.0)
MCHC: 33.2 g/dL (ref 30.0–36.0)
MCV: 84.6 fL (ref 80.0–100.0)
Monocytes Absolute: 1.3 10*3/uL — ABNORMAL HIGH (ref 0.1–1.0)
Monocytes Relative: 9 %
Neutro Abs: 10 10*3/uL — ABNORMAL HIGH (ref 1.7–7.7)
Neutrophils Relative %: 72 %
Platelets: 252 10*3/uL (ref 150–400)
RBC: 4.49 MIL/uL (ref 4.22–5.81)
RDW: 15.2 % (ref 11.5–15.5)
WBC: 14.1 10*3/uL — ABNORMAL HIGH (ref 4.0–10.5)
nRBC: 0 % (ref 0.0–0.2)

## 2021-05-09 LAB — COMPREHENSIVE METABOLIC PANEL
ALT: 19 U/L (ref 0–44)
AST: 58 U/L — ABNORMAL HIGH (ref 15–41)
Albumin: 4.3 g/dL (ref 3.5–5.0)
Alkaline Phosphatase: 74 U/L (ref 38–126)
Anion gap: 10 (ref 5–15)
BUN: 18 mg/dL (ref 6–20)
CO2: 22 mmol/L (ref 22–32)
Calcium: 9.3 mg/dL (ref 8.9–10.3)
Chloride: 102 mmol/L (ref 98–111)
Creatinine, Ser: 1.37 mg/dL — ABNORMAL HIGH (ref 0.61–1.24)
GFR, Estimated: >60 mL/min (ref >60)
Glucose, Bld: 100 mg/dL — ABNORMAL HIGH (ref 70–99)
Potassium: 3.7 mmol/L (ref 3.5–5.1)
Sodium: 134 mmol/L — ABNORMAL LOW (ref 135–145)
Total Bilirubin: 0.9 mg/dL (ref 0.3–1.2)
Total Protein: 8.4 g/dL — ABNORMAL HIGH (ref 6.5–8.1)

## 2021-05-09 LAB — TROPONIN I (HIGH SENSITIVITY)
Troponin I (High Sensitivity): 7 ng/L (ref <18)
Troponin I (High Sensitivity): 8 ng/L (ref <18)

## 2021-05-09 LAB — LIPASE, BLOOD: Lipase: 29 U/L (ref 11–51)

## 2021-05-09 MED ORDER — ONDANSETRON HCL 4 MG PO TABS: 4.0000 mg | ORAL_TABLET | Freq: Four times a day (QID) | ORAL | 0 refills | Status: DC

## 2021-05-09 MED ORDER — SODIUM CHLORIDE 0.9 % IV BOLUS
1000.0000 mL | Freq: Once | INTRAVENOUS | Status: AC
  Administered 2021-05-09: 1000 mL via INTRAVENOUS

## 2021-05-09 MED ORDER — ALUM & MAG HYDROXIDE-SIMETH 200-200-20 MG/5ML PO SUSP
30.0000 mL | Freq: Once | ORAL | Status: AC
  Administered 2021-05-09: 30 mL via ORAL
  Filled 2021-05-09: qty 30

## 2021-05-09 MED ORDER — LIDOCAINE VISCOUS HCL 2 % MT SOLN
15.0000 mL | Freq: Once | OROMUCOSAL | Status: AC
  Administered 2021-05-09: 15 mL via ORAL
  Filled 2021-05-09: qty 15

## 2021-05-09 MED ORDER — ONDANSETRON 8 MG PO TBDP
8.0000 mg | ORAL_TABLET | Freq: Once | ORAL | Status: AC
  Administered 2021-05-09: 8 mg via ORAL
  Filled 2021-05-09: qty 1

## 2021-05-09 MED ORDER — ONDANSETRON HCL 4 MG/2ML IJ SOLN
4.0000 mg | Freq: Once | INTRAMUSCULAR | Status: AC
  Administered 2021-05-09: 4 mg via INTRAVENOUS
  Filled 2021-05-09: qty 2

## 2021-05-09 NOTE — ED Notes (Signed)
Pt passed PO trial.

## 2021-05-09 NOTE — ED Provider Notes (Signed)
Care of patient was assumed from Dr. Francia Greaves at 3:30 PM.  This patient arrives with recent history of vomiting and diarrhea.  He was found to be tachycardic on arrival.  He does have an abnormal EKG but this is consistent with prior EKGs.  Troponins were normal.  Patient likely has enteritis.  He is currently undergoing IV hydration.  He has received Zofran and has been tolerating p.o. intake.  Reassess to ensure resolution of tachycardia.  He will likely be appropriate for discharge. ?Physical Exam  ?BP 108/72   Pulse (!) 102   Temp 98.5 ?F (36.9 ?C) (Oral)   Resp (!) 8   SpO2 100%  ? ?Physical Exam ?Vitals and nursing note reviewed.  ?Constitutional:   ?   General: He is not in acute distress. ?   Appearance: Normal appearance. He is well-developed. He is not ill-appearing, toxic-appearing or diaphoretic.  ?HENT:  ?   Head: Normocephalic and atraumatic.  ?   Right Ear: External ear normal.  ?   Left Ear: External ear normal.  ?   Nose: Nose normal.  ?   Mouth/Throat:  ?   Mouth: Mucous membranes are moist.  ?   Pharynx: Oropharynx is clear.  ?Eyes:  ?   Conjunctiva/sclera: Conjunctivae normal.  ?Cardiovascular:  ?   Rate and Rhythm: Normal rate and regular rhythm.  ?   Heart sounds: No murmur heard. ?Pulmonary:  ?   Effort: Pulmonary effort is normal. No respiratory distress.  ?Abdominal:  ?   Palpations: Abdomen is soft.  ?   Tenderness: There is abdominal tenderness. There is no guarding or rebound.  ?Musculoskeletal:     ?   General: No swelling.  ?   Cervical back: Neck supple.  ?Skin: ?   General: Skin is warm and dry.  ?   Capillary Refill: Capillary refill takes less than 2 seconds.  ?   Coloration: Skin is not jaundiced or pale.  ?Neurological:  ?   General: No focal deficit present.  ?   Mental Status: He is alert.  ?Psychiatric:     ?   Mood and Affect: Mood normal.     ?   Behavior: Behavior normal.  ? ? ?Procedures  ?Procedures ? ?ED Course / MDM  ?  ?Medical Decision Making ?Amount and/or  Complexity of Data Reviewed ?Labs: ordered. ?Radiology: ordered. ? ?Risk ?OTC drugs. ?Prescription drug management. ? ? ?On assessment, patient resting comfortably.  He received 2 L of IV fluids and did have resolution of his tachycardia.  He does endorse some abdominal pain and, on exam, there is some periumbilical tenderness.  CT scan was ordered.  Because he has a slight increase in his creatinine, noncontrasted study was obtained.  Results of CT scan showed no acute findings.  Patient continued to tolerate p.o. intake while in the ED.  I spoke with his mother over the phone who states that she will come pick him up.  Patient was discharged in good condition. ? ? ? ? ?  ?Godfrey Pick, MD ?05/10/21 0330 ? ?

## 2021-05-09 NOTE — ED Notes (Signed)
IV access lost. IV placer contacted. ?

## 2021-05-09 NOTE — ED Notes (Signed)
Pt unable to tolerate PO meds. Episode of emesis. ?

## 2021-05-09 NOTE — ED Notes (Signed)
Return from CT

## 2021-05-09 NOTE — ED Notes (Signed)
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 Pt states he had sudden onset n/v/d yesterday. Denies blood or abnormalities in stool/emesis. ABD soft nontender, pt states it hurts when vomiting. Denies sick contacts, ETOH or food poisoning ?

## 2021-05-09 NOTE — ED Notes (Signed)
IV team not able to get access. MD notified. ?

## 2021-05-09 NOTE — ED Notes (Signed)
ED Provider at bedside. 

## 2021-05-09 NOTE — Discharge Instructions (Addendum)
Return for any problem.  ? ?Drink plenty of fluids. ?

## 2021-05-09 NOTE — ED Provider Notes (Signed)
?[facility] DE[facility] ?Provider Note ? ? ?CSN: [id number] ?Arrival date & time: 05/09/21  5462 ? ?  ? ?History ? ?Chief Complaint  ?Patient presents with  ? Emesis  ? Nausea  ? Diarrhea  ? ? ?Sean Kim is a 21 y.o. male. ? ?21 year old male with prior medical history as detailed below presents for evaluation.  Patient reports onset of nausea, vomiting, and diarrhea since yesterday.  He denies fever.  He denies bloody emesis or bloody stool. ? ?Patient did not take anything for his symptoms. ? ?The history is provided by the patient and medical records.  ?Emesis ?Severity:  Moderate ?Duration:  1 day ?Timing:  Rare ?Quality:  Stomach contents ?Progression:  Unchanged ?Chronicity:  New ?Associated symptoms: diarrhea   ?Diarrhea ?Associated symptoms: vomiting   ? ?  ? ?Home Medications ?Prior to Admission medications   ?Medication Sig Start Date End Date Taking? Authorizing Provider  ?acetaminophen (TYLENOL) 500 MG tablet Take 1,000 mg by mouth every 6 (six) hours as needed for mild pain, fever or headache.    [provider]  ?dexmethylphenidate (FOCALIN) 5 MG tablet Take 1 tablet (5 mg total) by mouth 2 (two) times daily. 03/11/19   Caroline More, DO  ?Glucagon, rDNA, (GLUCAGON EMERGENCY IJ) Inject 1 mg as directed as needed (blood sugar).     [provider]  ?levothyroxine (SYNTHROID) 125 MCG tablet Take 1 tablet (125 mcg total) by mouth daily at 6 (six) AM. 11/09/18   Mullis, Kiersten P, DO  ?levothyroxine (SYNTHROID) 137 MCG tablet Take 137 mcg by mouth every morning. 03/13/19   [provider]  ?ondansetron (ZOFRAN) 4 MG tablet Take 1 tablet (4 mg total) by mouth every 8 (eight) hours as needed for nausea or vomiting. 01/16/21   Mar Daring, PA-C  ?ondansetron (ZOFRAN-ODT) 4 MG disintegrating tablet Take 1 tablet (4 mg total) by mouth every 8 (eight) hours as needed for nausea or vomiting. 03/25/21   Montine Circle, PA-C  ?   ? ?Allergies    ?Patient  has no known allergies.   ? ?Review of Systems   ?Review of Systems  ?Gastrointestinal:  Positive for diarrhea and vomiting.  ?All other systems reviewed and are negative. ? ?Physical Exam ?Updated Vital Signs ?BP 109/72   Pulse 87   Temp 98.5 ?F (36.9 ?C) (Oral)   Resp (!) 22   SpO2 100%  ?Physical Exam ?Vitals and nursing note reviewed.  ?Constitutional:   ?   General: He is not in acute distress. ?   Appearance: Normal appearance. He is well-developed.  ?HENT:  ?   Head: Normocephalic and atraumatic.  ?Eyes:  ?   Conjunctiva/sclera: Conjunctivae normal.  ?   Pupils: Pupils are equal, round, and reactive to light.  ?Cardiovascular:  ?   Rate and Rhythm: Regular rhythm. Tachycardia present.  ?   Heart sounds: Normal heart sounds.  ?Pulmonary:  ?   Effort: Pulmonary effort is normal. No respiratory distress.  ?   Breath sounds: Normal breath sounds.  ?Abdominal:  ?   General: There is no distension.  ?   Palpations: Abdomen is soft.  ?   Tenderness: There is no abdominal tenderness.  ?Musculoskeletal:     ?   General: No deformity. Normal range of motion.  ?   Cervical back: Normal range of motion and neck supple.  ?Skin: ?   General: Skin is warm and dry.  ?Neurological:  ?   General: No focal deficit present.  ?  Mental Status: He is alert and oriented to person, place, and time.  ? ? ?ED Results / Procedures / Treatments   ?Labs ?(all labs ordered are listed, but only abnormal results are displayed) ?Labs Reviewed  ?CBC WITH DIFFERENTIAL/PLATELET - Abnormal; Notable for the following components:  ?    Result Value  ? WBC 14.1 (*)   ? Hemoglobin 12.6 (*)   ? HCT 38.0 (*)   ? Neutro Abs 10.0 (*)   ? Monocytes Absolute 1.3 (*)   ? All other components within normal limits  ?COMPREHENSIVE METABOLIC PANEL  ?LIPASE, BLOOD  ?TROPONIN I (HIGH SENSITIVITY)  ? ? ?EKG ?EKG Interpretation ? ?Date/Time:  Monday May 09 2021 09:30:05 EST ?Ventricular Rate:  119 ?PR Interval:  122 ?QRS Duration: 121 ?QT  Interval:  328 ?QTC Calculation: 462 ?R Axis:   37 ?Text Interpretation: Sinus tachycardia Nonspecific intraventricular conduction delay Repol abnrm, severe global ischemia (LM/MVD) Confirmed by Dene Gentry [phone]) on 05/09/2021 9:32:29 AM ? ?Radiology ?DG Chest Port 1 View ? ?Result Date: 05/09/2021 ?CLINICAL DATA:  Chest pain, nausea, vomiting EXAM: PORTABLE CHEST 1 VIEW COMPARISON:  Chest radiograph 03/25/2021 FINDINGS: The cardiomediastinal silhouette is normal. There is no focal consolidation or pulmonary edema. There is no pleural effusion or pneumothorax. There is no acute osseous abnormality. IMPRESSION: No radiographic evidence of acute cardiopulmonary process. Electronically Signed   By: Valetta Mole M.D.   On: 05/09/2021 10:05   ? ?Procedures ?Procedures  ? ? ?Medications Ordered in ED ?Medications  ?sodium chloride 0.9 % bolus 1,000 mL (1,000 mLs Intravenous New Bag/Given 05/09/21 1000)  ?ondansetron [facility]) injection 4 mg (4 mg Intravenous Given 05/09/21 0954)  ? ? ?ED Course/ Medical Decision Making/ A&P ?  ?                        ?Medical Decision Making ?Amount and/or Complexity of Data Reviewed ?Labs: ordered. ?Radiology: ordered. ? ?Risk ?OTC drugs. ?Prescription drug management. ? ? ? ?Medical Screen Complete ? ?This patient presented to the ED with complaint of nausea, vomiting, diarrhea. ? ?This complaint involves an extensive number of treatment options. The initial differential diagnosis includes, but is not limited to, viral gastroenteritis, metabolic abnormality, dehydration, etc. ? ?This presentation is: Acute, Self-Limited, Previously Undiagnosed, Uncertain Prognosis, Complicated, Systemic Symptoms, and Threat to Life/Bodily Function ? ?Patient is presenting with complaint of nausea, vomiting, diarrhea.  Symptoms began yesterday.  Patient reports he was unable to tolerate any p.o. this morning. ? ?Denies abdominal discomfort he reports mild epigastric abdominal discomfort. ? ?Initial IV fluid  bolus initiated.  Unfortunately patient's IV required replacement after he dislodged it. ? ?Patient is feeling improved.  He remains mildly tachycardic. ? ?Patient with mildly elevated white count of 14.  Comprehensive metabolic is without significant abnormality.  Lipase 29.  Troponin ordered out of abundance of caution given patient's epigastric discomfort.  Troponin levels are persistently low with repeat checks. ? ?Pending re-evaluation and disposition signed out to Dr. Doren Custard. ? ?Co morbidities that complicated the patient's evaluation ? ?Cognitive deficits ? ? ?Additional history obtained: ? ?External records from outside sources obtained and reviewed including prior ED visits and prior Inpatient records.  ? ? ?Lab Tests: ? ?I ordered and personally interpreted labs.  The pertinent results include: CBC, CMP, lipase, troponin ? ?Cardiac Monitoring: ? ?The patient was maintained on a cardiac monitor.  I personally viewed and interpreted the cardiac monitor which showed an underlying rhythm of: Sinus tachycardia ? ? ?  Medicines ordered: ? ?I ordered medication including IV fluids, Zofran for nausea, dehydration ?Reevaluation of the patient after these medicines showed that the patient: improved ? ? ?Problem List / ED Course: ? ?Nausea, vomiting, diarrhea, tachycardia, dehydration ? ? ?Reevaluation: ? ?After the interventions noted above, I reevaluated the patient and found that they have: improved ? ? ?Disposition: ? ?After consideration of the diagnostic results and the patients response to treatment, I feel that the patent would benefit from continued ED treatment and re-evaluation.  ? ? ? ? ? ? ? ? ?Final Clinical Impression(s) / ED Diagnoses ?Final diagnoses:  ?Diarrhea, unspecified type  ?Dehydration  ?Nausea  ? ? ?Rx / DC Orders ?ED Discharge Orders   ? ?      Ordered  ?  ondansetron (ZOFRAN) 4 MG tablet  Every 6 hours       ? 05/09/21 1506  ? ?  ?  ? ?  ? ? ?  ?Valarie Merino, MD ?05/11/21 0945 ? ?

## 2021-05-09 NOTE — ED Notes (Signed)
Pt NAD, a/ox4. Pt mother/guardian verbalizes understanding of all DC and f/u instructions. All questions answered. Pt walks with steady gait to lobby at DC.  ? ?

## 2021-05-09 NOTE — ED Triage Notes (Signed)
Pt BIBA from home. C/o N/V/D, and abd pain since yesterday morning. Pt unable to keep fluids down.  ? 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 Pt also has cognitive deficits. ? ?AOX4 ? ?BP:100/60 ?HR: 84 ?RR: 18 ?CBG: 151  ?

## 2021-05-09 NOTE — ED Notes (Signed)
Patient transported to CT 

## 2021-05-10 ENCOUNTER — Encounter (HOSPITAL_COMMUNITY): Payer: Self-pay

## 2021-05-10 ENCOUNTER — Ambulatory Visit (HOSPITAL_COMMUNITY)
Admission: EM | Admit: 2021-05-10 | Discharge: 2021-05-10 | Disposition: A | Discharge status: Home / Self Care | Payer: Medicaid Other

## 2021-05-10 ENCOUNTER — Telehealth: Payer: Medicaid Other | Admitting: Nurse Practitioner

## 2021-05-10 DIAGNOSIS — A084 Viral intestinal infection, unspecified: Secondary | ICD-10-CM | POA: Diagnosis not present

## 2021-05-10 DIAGNOSIS — R112 Nausea with vomiting, unspecified: Secondary | ICD-10-CM

## 2021-05-10 MED ORDER — ONDANSETRON HCL 4 MG/2ML IJ SOLN
INTRAMUSCULAR | Status: AC
  Filled 2021-05-10: qty 2

## 2021-05-10 MED ORDER — ONDANSETRON HCL 4 MG/2ML IJ SOLN
4.0000 mg | Freq: Once | INTRAMUSCULAR | Status: AC
  Administered 2021-05-10: 4 mg via INTRAMUSCULAR

## 2021-05-10 NOTE — Discharge Instructions (Addendum)
-  In 30 minutes attempt the Cortef. If he can't keep this down, head to the ED for stronger nausea medications. ?-Take the Zofran (ondansetron) up to 3 times daily for nausea and vomiting. Dissolve one pill under your tongue or between your teeth and your cheek. ?-Drink plenty of fluids and eat a bland diet  ?

## 2021-05-10 NOTE — ED Triage Notes (Signed)
2 day h/o emesis and diarrhea. Pt was seen at [facility] long yesterday for sxs tx witlh Zofran. Mom reports zofran did not decrease sxs.Today, he has a new onset of HA, fatigue and abdominal pain has worsened. Pt was seen earlier today by endo who advised that he continue to take his daily meds  (cortef '10mg'$ ) but increase the dose for the next 3 days. Mom notes shortly after taking daily meds Pt had another episode of emesis, so she had a video visit and was advised to come to UC. Afebrile. No other illness within the home. ? ? ?

## 2021-05-10 NOTE — Progress Notes (Signed)
?Virtual Visit Consent  ? ?Sean Kim, you are scheduled for a virtual visit with a [facility] provider today.   ?  ?Just as with appointments in the office, your consent must be obtained to participate.  Your consent will be active for this visit and any virtual visit you may have with one of our providers in the next 365 days.   ?  ?If you have a MyChart account, a copy of this consent can be sent to you electronically.  All virtual visits are billed to your insurance company just like a traditional visit in the office.   ? ?As this is a virtual visit, video technology does not allow for your provider to perform a traditional examination.  This may limit your provider's ability to fully assess your condition.  If your provider identifies any concerns that need to be evaluated in person or the need to arrange testing (such as labs, EKG, etc.), we will make arrangements to do so.   ?  ?Although advances in technology are sophisticated, we cannot ensure that it will always work on either your end or our end.  If the connection with a video visit is poor, the visit may have to be switched to a telephone visit.  With either a video or telephone visit, we are not always able to ensure that we have a secure connection.    ? ?I need to obtain your verbal consent now.   Are you willing to proceed with your visit today?  ?  ?Sean Kim has provided verbal consent on 05/10/2021 for a virtual visit (video or telephone). ? Mother is also present for video and provides history  ?Sean Kim  ? ?Date: 05/10/2021 6:25 PM ? ? ?Virtual Visit via Video Note  ? ?Sean Kim, connected with  Sean Kim  ([id number], 2000-07-14) on 05/10/21 at  6:30 PM EST by a video-enabled telemedicine application and verified that I am speaking with the correct person using two identifiers. ? ?Location: ?Patient: Virtual Visit Location Patient: Home ?Provider: Virtual Visit Location Provider: Home Office ? Mother:Home  ?I discussed  the limitations of evaluation and management by telemedicine and the availability of in person appointments. The patient expressed understanding and agreed to proceed.   ? ?History of Present Illness: ?Sean Kim is a 21 y.o. who identifies as a male who was assigned male at birth, and is being seen today for follow up after recent ER visit for nausea/vomiting and diarrhea yesterday +COVID-19. He was managed there for dehydration and discharged with Zofran to use at home.  ? ?He had a follow up with his endocrinologist today  ? ?This evening when trying to take his daily medications he vomited within 2 minutes. Still unable to keep anything down even when using Zofran ?Diarrhea has decreased to two episodes today  ? ? ?Problems:  ?Patient Active Problem List  ? Diagnosis Date Noted  ? Caregiver burden 07/05/2019  ? History of attempted suicide 07/05/2019  ? Constipation 11/12/2018  ? Abnormal EKG 11/12/2018  ? Encephalopathy due to COVID-19 virus 10/18/2018  ? Elevated alkaline phosphatase level 05/29/2018  ? Routine adult health maintenance 10/31/2017  ? Healthy adult on routine physical examination 10/31/2017  ? 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([place])   ? Cognitive deficits   ? Intermittent left side weakness   ? 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 ADHD 07/29/2017  ? Hypoglycemia 07/23/2017  ?  ?Allergies: No Known Allergies ?Medications:  ?Current Outpatient  Medications:  ?  acetaminophen (TYLENOL) 500 MG tablet, Take 1,000 mg by mouth every 6 (six) hours as needed for mild pain, fever or headache., Disp: , Rfl:  ?  dexmethylphenidate (FOCALIN) 5 MG tablet, Take 1 tablet (5 mg total) by mouth 2 (two) times daily., Disp: 60 tablet, Rfl: 0 ?  Glucagon, rDNA, (GLUCAGON EMERGENCY IJ), Inject 1 mg as directed as needed (blood sugar). , Disp: , Rfl:  ?  levothyroxine (SYNTHROID) 125 MCG tablet, Take 1 tablet (125 mcg total) by mouth daily at 6 (six) AM., Disp: 30 tablet, Rfl: 0 ?  levothyroxine  (SYNTHROID) 137 MCG tablet, Take 137 mcg by mouth every morning., Disp: , Rfl:  ?  ondansetron (ZOFRAN) 4 MG tablet, Take 1 tablet (4 mg total) by mouth every 8 (eight) hours as needed for nausea or vomiting., Disp: 20 tablet, Rfl: 0 ?  ondansetron (ZOFRAN) 4 MG tablet, Take 1 tablet (4 mg total) by mouth every 6 (six) hours., Disp: 12 tablet, Rfl: 0 ?  ondansetron (ZOFRAN-ODT) 4 MG disintegrating tablet, Take 1 tablet (4 mg total) by mouth every 8 (eight) hours as needed for nausea or vomiting., Disp: 10 tablet, Rfl: 0 ? ?Observations/Objective: ?Patient is well-developed in no acute distress.  ?Resting comfortably at home.  ?Head is normocephalic, atraumatic.  ?No labored breathing.  ?Patient is alert and oriented at baseline.  ? ? ?Assessment and Plan: ?1. Nausea and vomiting, unspecified vomiting type ?Discussed plan with mother, since patient is unable to keep anything down still and Zofran is not controlling symptoms they will need to be seen in person again for IM or IV medication administration and hydration as discussed ? ?Mother is agreeable to take him to UC/ED now ?   ? ?Follow Up Instructions: ?I discussed the assessment and treatment plan with the patient. The patient was provided an opportunity to ask questions and all were answered. The patient agreed with the plan and demonstrated an understanding of the instructions.  A copy of instructions were sent to the patient via MyChart unless otherwise noted below.  ? ? ?The patient was advised to call back or seek an in-person evaluation if the symptoms worsen or if the condition fails to improve as anticipated. ? ?Time:  ?I spent 10 minutes with the patient via telehealth technology discussing the above problems/concerns.   ? ?Sean Kim  ?

## 2021-05-10 NOTE — ED Provider Notes (Signed)
[place]    CSN: [id number] Arrival date & time: 05/10/21  1847      History   Chief Complaint Chief Complaint  Patient presents with   Emesis   Diarrhea    HPI Sean Kim is a 21 y.o. male presenting with 2 days of nausea and vomiting.  History extensive as below, including cerebral infarction, encephalopathy, hypopituitary.  He takes daily hydrocortisone supplement prescribed by endocrine.  Here today with mom.  Mom describes 2 days of nausea with vomiting and diarrhea.  Was seen in [facility] 1 day ago for this, they provided nausea medication.  Mom describes 2 episodes of bilious vomiting today, with several episodes of watery diarrhea.  He is tolerating little fluids and no food.  She states that she presented to the urgent care because he vomited up his evening dose of Cortef, and she had a televisit where they told her to go to urgent care.  Denies known sick exposures, recent eating out.  HPI  Past Medical History:  Diagnosis Date   ADHD    COVID-19 virus infection    Hypoglycemia    MR (mental retardation)    Pituitary abnormality ([place])    Seizures ([place])    Stroke [facility])    Thyroid disease     Patient Active Problem List   Diagnosis Date Noted   Caregiver burden 07/05/2019   History of attempted suicide 07/05/2019   Constipation 11/12/2018   Abnormal EKG 11/12/2018   Encephalopathy due to COVID-19 virus 10/18/2018   Elevated alkaline phosphatase level 05/29/2018   Routine adult health maintenance 10/31/2017   Healthy adult on routine physical examination 10/31/2017   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[facility])    Cognitive deficits    Intermittent left side weakness    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 Authorizing Provider  acetaminophen (TYLENOL) 500 MG tablet Take 1,000 mg by mouth every 6 (six) hours as needed for mild pain, fever or headache.    [provider]  dexmethylphenidate (FOCALIN) 5 MG tablet Take 1 tablet (5 mg total) by mouth 2 (two) times daily. 03/11/19   Caroline More, DO  Glucagon, rDNA, (GLUCAGON EMERGENCY IJ) Inject 1 mg as directed as needed (blood sugar).     [provider]  hydrocortisone (CORTEF) 10 MG tablet 2 tablets each morning and 1 tablet in early afternoon for next three days, then 1 tablet each morning once a day    [provider]  levothyroxine (SYNTHROID) 125 MCG tablet Take 1 tablet (125 mcg total) by mouth daily at 6 (six) AM. 11/09/18   Mullis, Kiersten P, DO  levothyroxine (SYNTHROID) 137 MCG tablet Take 137 mcg by mouth every morning. 03/13/19   [provider]  ondansetron (ZOFRAN) 4 MG tablet Take 1 tablet (4 mg total) by mouth every 8 (eight) hours as needed for nausea or vomiting. 01/16/21   Mar Daring, PA-C  ondansetron (ZOFRAN) 4 MG tablet Take 1 tablet (4 mg total) by mouth every 6 (six) hours. 05/09/21   Valarie Merino, MD  ondansetron (ZOFRAN-ODT) 4 MG disintegrating tablet Take 1 tablet (4 mg total) by mouth  every 8 (eight) hours as needed for nausea or vomiting. 03/25/21   Montine Circle, PA-C    Family History Family History  Problem Relation Age of Onset   Healthy Mother    Hypertension Maternal Grandmother     Social History Social History   Tobacco Use   Smoking status: Never   Smokeless tobacco: Never  Vaping Use   Vaping Use: Never used  Substance Use Topics   Alcohol use: No   Drug use: No     Allergies   Patient has no known allergies.   Review of Systems Review of Systems  Constitutional:  Negative for appetite change, chills, diaphoresis, fever and unexpected weight change.  HENT:  Negative for congestion, ear pain, sinus  pressure, sinus pain, sneezing, sore throat and trouble swallowing.   Respiratory:  Negative for cough, chest tightness and shortness of breath.   Cardiovascular:  Negative for chest pain.  Gastrointestinal:  Positive for diarrhea, nausea and vomiting. Negative for abdominal distention, abdominal pain, anal bleeding, blood in stool, constipation and rectal pain.  Genitourinary:  Negative for dysuria, flank pain, frequency and urgency.  Musculoskeletal:  Negative for back pain and myalgias.  Neurological:  Negative for dizziness, light-headedness and headaches.    Physical Exam Triage Vital Signs ED Triage Vitals [05/10/21 1913]  Enc Vitals Group     BP 114/76     Pulse Rate 99     Resp 18     Temp 97.6 F (36.4 C)     Temp Source Oral     SpO2 99 %     Weight      Height      Head Circumference      Peak Flow      Pain Score      Pain Loc      Pain Edu?      Excl. in Page?    No data found.  Updated Vital Signs BP 114/76 (BP Location: Right Arm)    Pulse 99    Temp 97.6 F (36.4 C) (Oral)    Resp 18    SpO2 99%   Visual Acuity Right Eye Distance:   Left Eye Distance:   Bilateral Distance:    Right Eye Near:   Left Eye Near:    Bilateral Near:     Physical Exam Vitals reviewed.  Constitutional:      General: He is not in acute distress.    Appearance: Normal appearance. He is not ill-appearing.  HENT:     Head: Normocephalic and atraumatic.     Mouth/Throat:     Mouth: Mucous membranes are moist.     Comments: Moist mucous membranes Eyes:     Extraocular Movements: Extraocular movements intact.     Pupils: Pupils are equal, round, and reactive to light.  Cardiovascular:     Rate and Rhythm: Normal rate and regular rhythm.     Heart sounds: Normal heart sounds.  Pulmonary:     Effort: Pulmonary effort is normal.     Breath sounds: Normal breath sounds. No wheezing, rhonchi or rales.  Abdominal:     General: Bowel sounds are increased. There is no  distension.     Palpations: Abdomen is soft. There is no mass.     Tenderness: There is generalized abdominal tenderness. There is no right CVA tenderness, left CVA tenderness, guarding or rebound.     Comments: Generalized TTP without focal component.   Skin:    General: Skin is  warm.     Capillary Refill: Capillary refill takes less than 2 seconds.     Comments: Good skin turgor  Neurological:     General: No focal deficit present.     Mental Status: He is alert and oriented to person, place, and time.  Psychiatric:        Mood and Affect: Mood normal.        Behavior: Behavior normal.     UC Treatments / Results  Labs (all labs ordered are listed, but only abnormal results are displayed) Labs Reviewed - No data to display  EKG   Radiology CT ABDOMEN PELVIS WO CONTRAST  Result Date: 05/09/2021 CLINICAL DATA:  Acute generalized abdominal pain, nausea, vomiting. EXAM: CT ABDOMEN AND PELVIS WITHOUT CONTRAST TECHNIQUE: Multidetector CT imaging of the abdomen and pelvis was performed following the standard protocol without IV contrast. RADIATION DOSE REDUCTION: This exam was performed according to the departmental dose-optimization program which includes automated exposure control, adjustment of the mA and/or kV according to patient size and/or use of iterative reconstruction technique. COMPARISON:  None. FINDINGS: Lower chest: No acute abnormality. Hepatobiliary: No focal liver abnormality is seen. No gallstones, gallbladder wall thickening, or biliary dilatation. Pancreas: Unremarkable. No pancreatic ductal dilatation or surrounding inflammatory changes. Spleen: Normal in size without focal abnormality. Adrenals/Urinary Tract: Adrenal glands are unremarkable. Kidneys are normal, without renal calculi, focal lesion, or hydronephrosis. Bladder is unremarkable. Stomach/Bowel: Stomach is within normal limits. Appendix appears normal. No evidence of bowel wall thickening, distention, or  inflammatory changes. Vascular/Lymphatic: No significant vascular findings are present. No enlarged abdominal or pelvic lymph nodes. Reproductive: Prostate is unremarkable. Other: No abdominal wall hernia or abnormality. No abdominopelvic ascites. Musculoskeletal: No acute or significant osseous findings. IMPRESSION: No definite abnormality seen in the abdomen or pelvis. Electronically Signed   By: Marijo Conception M.D.   On: 05/09/2021 17:47   DG Chest Port 1 View  Result Date: 05/09/2021 CLINICAL DATA:  Chest pain, nausea, vomiting EXAM: PORTABLE CHEST 1 VIEW COMPARISON:  Chest radiograph 03/25/2021 FINDINGS: The cardiomediastinal silhouette is normal. There is no focal consolidation or pulmonary edema. There is no pleural effusion or pneumothorax. There is no acute osseous abnormality. IMPRESSION: No radiographic evidence of acute cardiopulmonary process. Electronically Signed   By: Valetta Mole M.D.   On: 05/09/2021 10:05    Procedures Procedures (including critical care time)  Medications Ordered in UC Medications  ondansetron (ZOFRAN) injection 4 mg (has no administration in time range)    Initial Impression / Assessment and Plan / UC Course  I have reviewed the triage vital signs and the nursing notes.  Pertinent labs & imaging results that were available during my care of the patient were reviewed by me and considered in my medical decision making (see chart for details).     This patient is a 21 y.o. year old male presenting with viral gastroenteritis. He is medically complex including history cerebral infarction, encephalopathy, hypopituitary; takes daily Cortef. Afebrile, nontachy, appears well hydrated.  2 episodes of vomiting today, but the patient cannot keep down his medications (Cortef). Has already been evaluated in the [facility] ED; no relief with the zofran ODT. Zofran IM administered during visit. If he still cannot keep down his essential medications, they will have to head  to the ED for further management, particularly considering time of day/ no other clinics open. Mom verbalizes understanding and agreement.  Final Clinical Impressions(s) / UC Diagnoses   Final diagnoses:  Viral gastroenteritis  Discharge Instructions      -In 30 minutes attempt the Cortef. If he can't keep this down, head to the ED for stronger nausea medications. -Take the Zofran (ondansetron) up to 3 times daily for nausea and vomiting. Dissolve one pill under your tongue or between your teeth and your cheek. -Drink plenty of fluids and eat a bland diet    ED Prescriptions   None    PDMP not reviewed this encounter.   Hazel Sams, PA-C 05/10/21 1933

## 2021-05-10 NOTE — Patient Instructions (Signed)
Based on what you shared with me, I feel your condition warrants further evaluation as soon as possible at an Emergency department.  ? ?  ?If you are having a true medical emergency please call 911.   ?  ? ?Emergency Department-[facility]  ?Get Driving Directions  ?[phone]  ?[address]  ?[place], [place] [address]  ?Open 24/7/365  ?  ?  ?[place] [facility] at [address]  ?Get Driving Directions  ?[address]  ?[place], [place] [address]  ?Open 24/7/365  ?  ?Emergency Department- [facility]  ?Get Driving Directions  ?[phone]  ?[address]  ?[place], [place] [address]  ?Open 24/7/365  ?  ?  ?[facility] at [facility]  ?Get Driving Directions  ?[phone]  ?[address]  ?[place], [place] [address]  ?Open 24/7/365  ?  ?[place]  ?Emergency Department- [facility]  ?Get Driving Directions  ?[phone]  ?[address]  ?[place], [place] [address]  ?Open 24/7/365  ?  ?[place]  ?Emergency Department- [facility] [place]  ?Get Driving Directions  ?[address]  ?[place], [place] [address]  ?Open 24/7/365  ?  ?[place]  ?Emergency Department-  [facility]  ?Get Driving Directions  ?[phone]  ?[address]  ?[place],  [address]  ?Open 24/7/365  ?  ?

## 2021-06-06 IMAGING — DX PORTABLE CHEST - 1 VIEW
1 series · 1 of 1 positions shown · non-contrast
Comparison: None.

CLINICAL DATA: Shortness of breath and fever. Coronavirus
infection.

EXAM:
PORTABLE CHEST 1 VIEW

[chest]
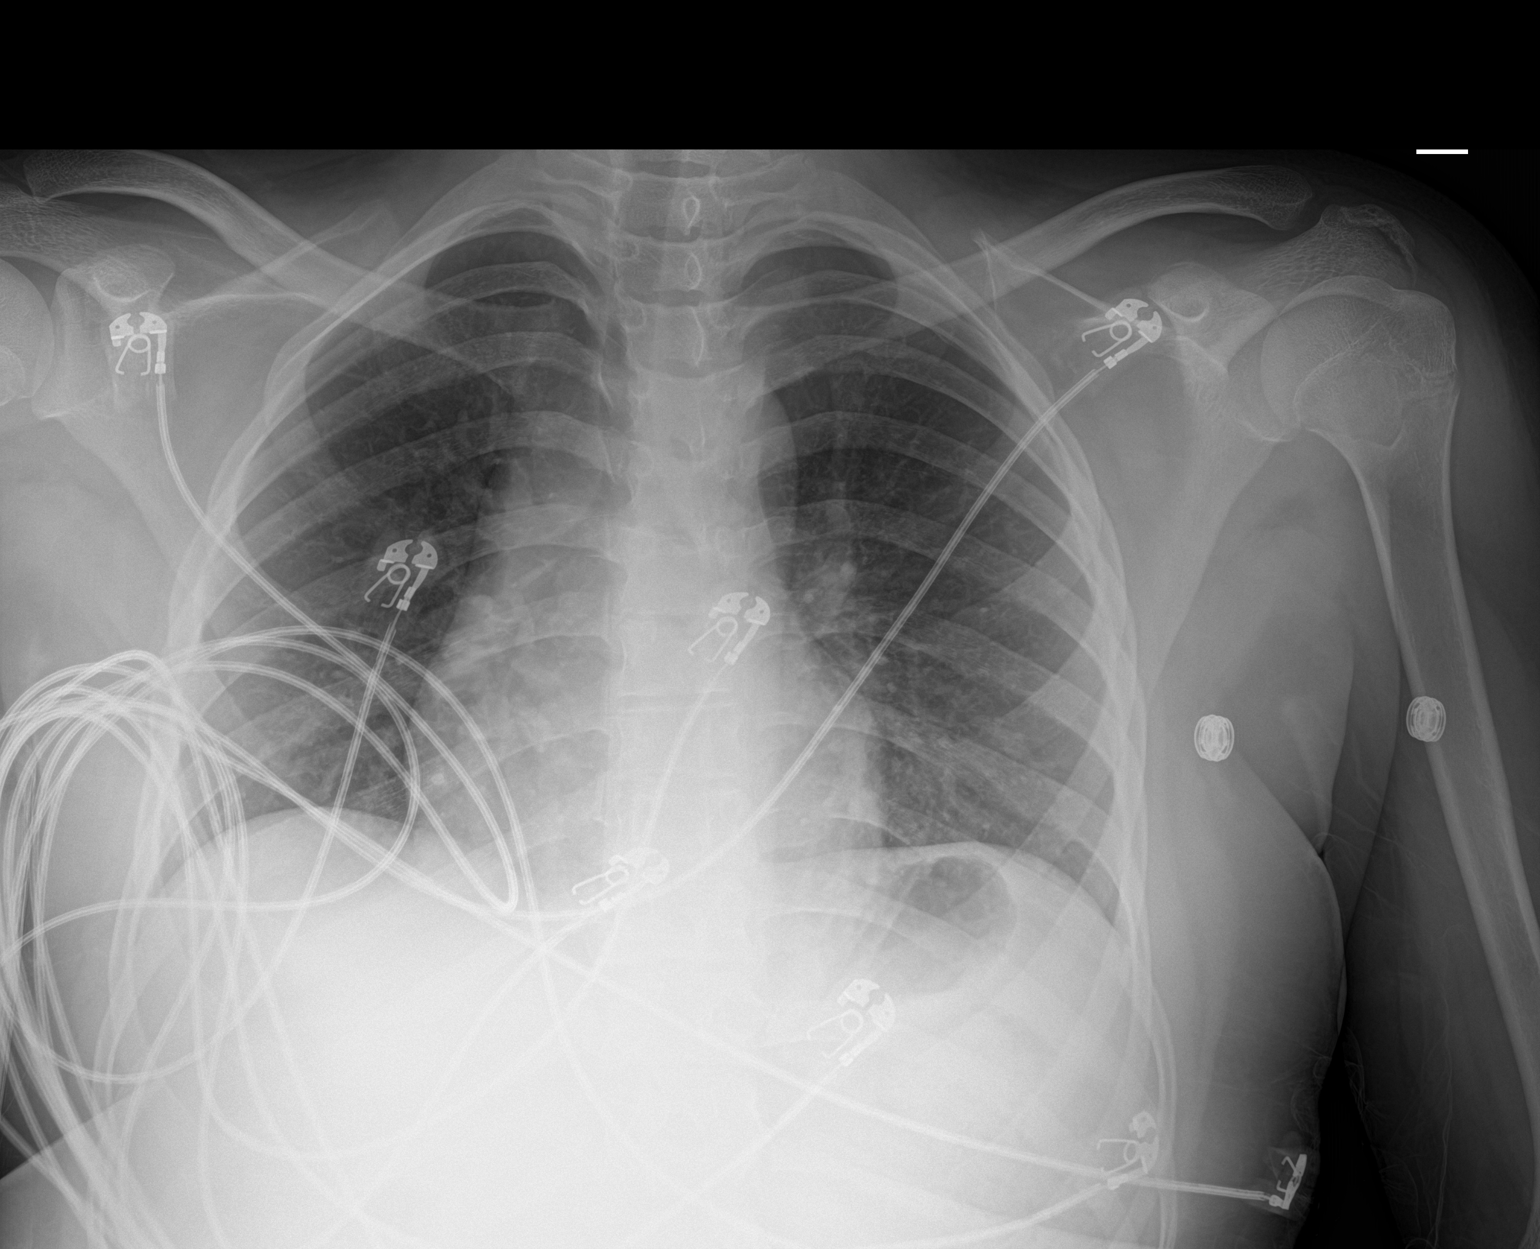

[1 of 1 positions shown; findings below may reference images not displayed]

FINDINGS: Artifact overlies the chest. Heart size is normal. Mediastinal
shadows are normal. Allowing for the portable AP image with somewhat
poor inspiration, there is no conclusive infiltrate. No collapse or
effusion.
IMPRESSION: Some technical limitations. No definite pneumonia based on this
single image.

## 2021-06-08 IMAGING — US US ABDOMEN LIMITED
1 series · 14 of 24 positions shown · non-contrast
Comparison: None.

CLINICAL DATA: Right upper quadrant pain

EXAM:
ULTRASOUND ABDOMEN LIMITED RIGHT UPPER QUADRANT

[Series 1: us abdomen limited · 14 of 24 slices shown]
[im 1/24]
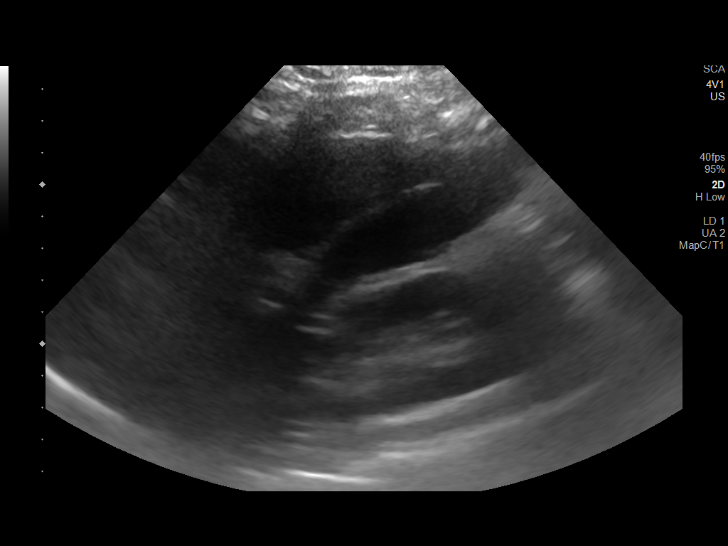
[im 3/24]
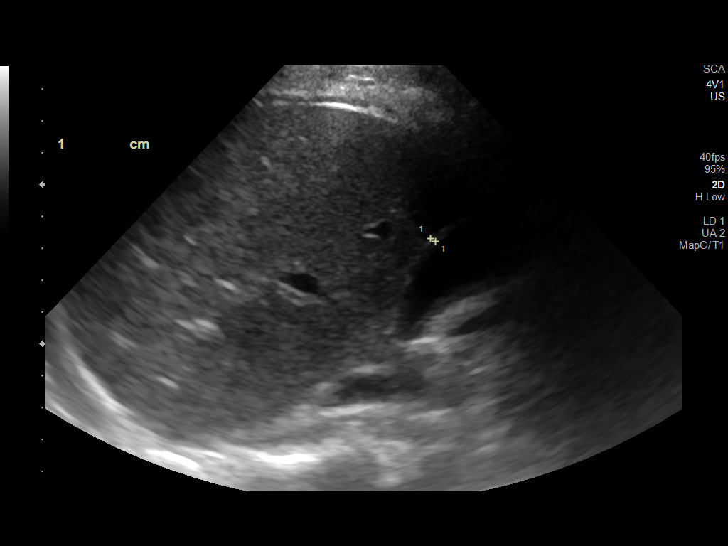
[im 5/24]
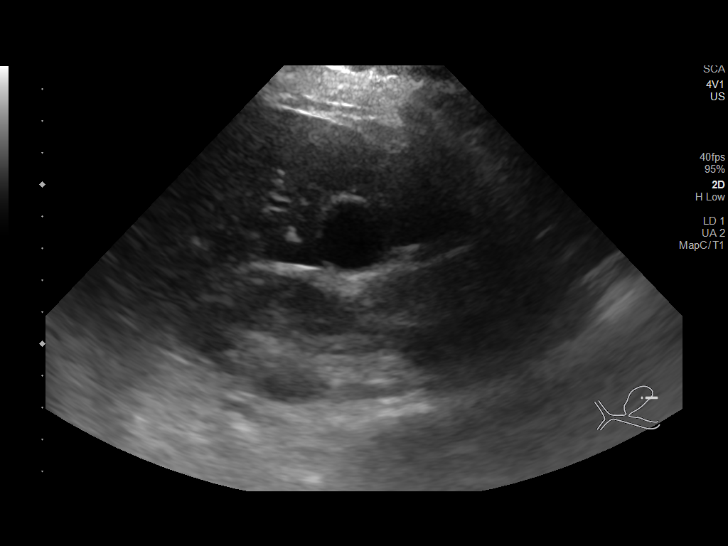
[im 7/24]
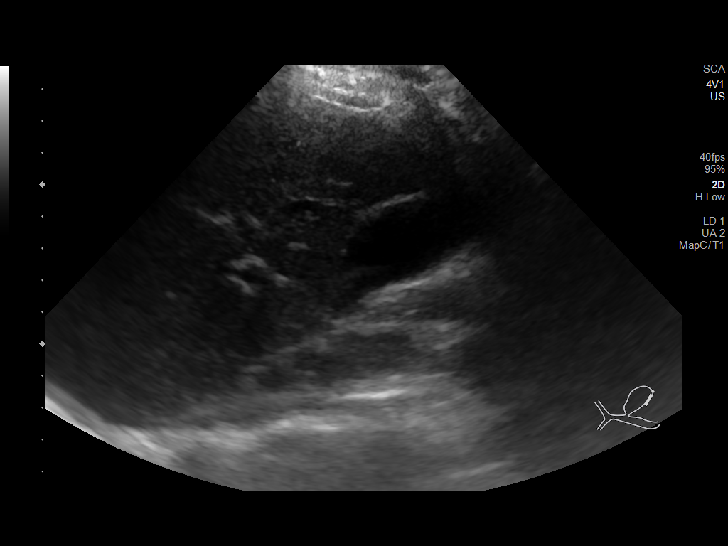
[im 8/24]
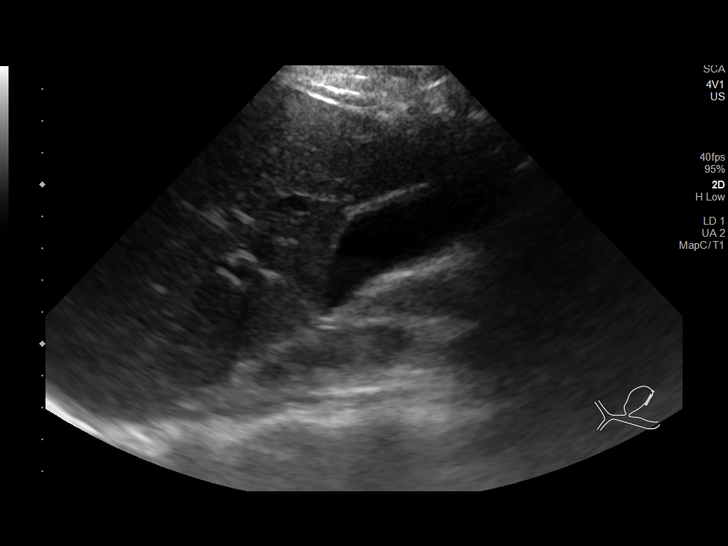
[im 10/24]
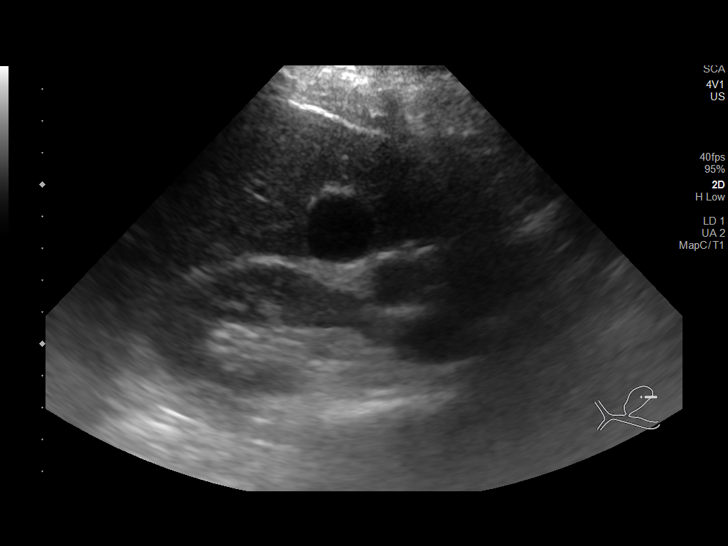
[im 12/24]
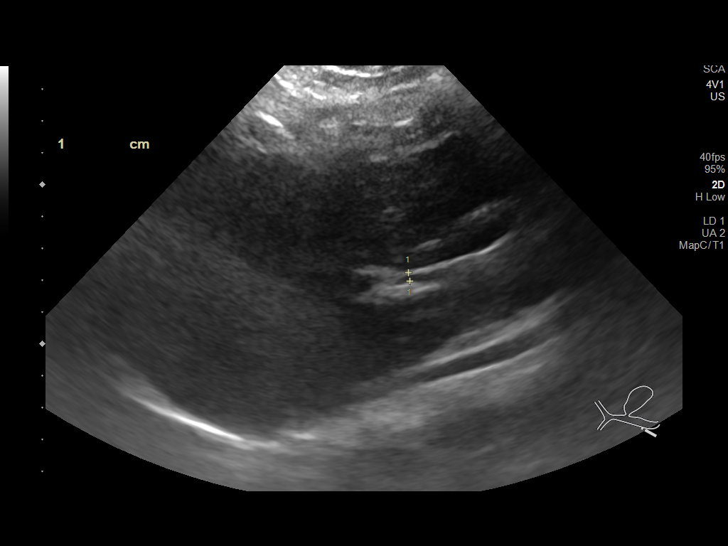
[im 13/24]
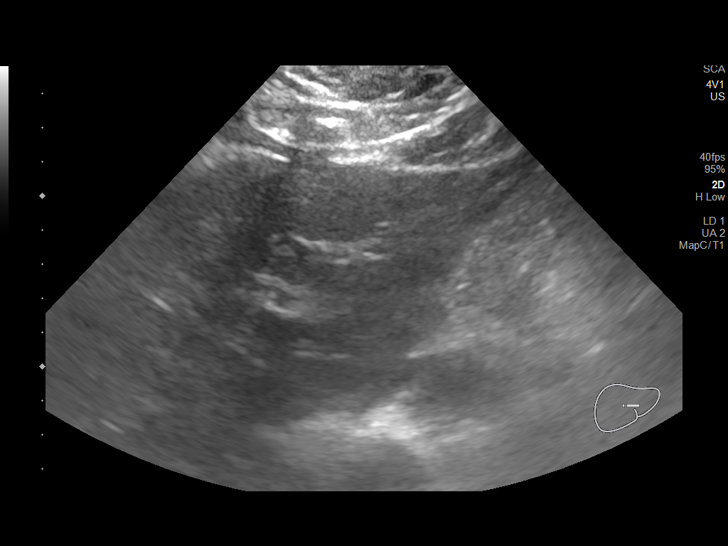
[im 15/24]
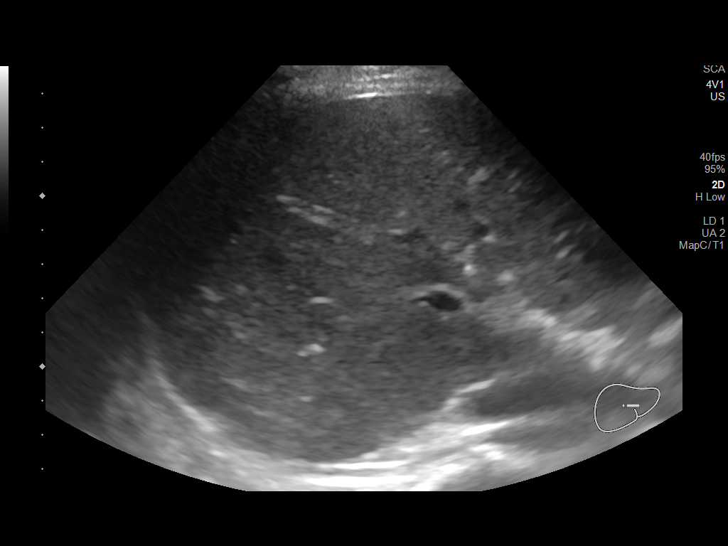
[im 17/24]
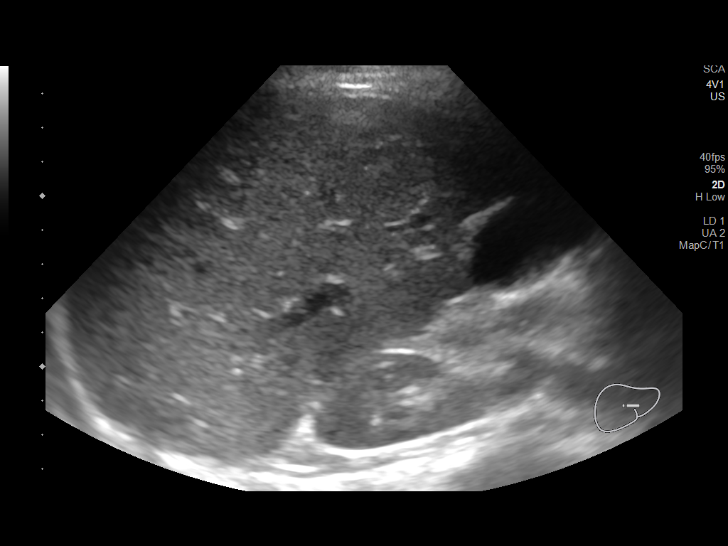
[im 19/24]
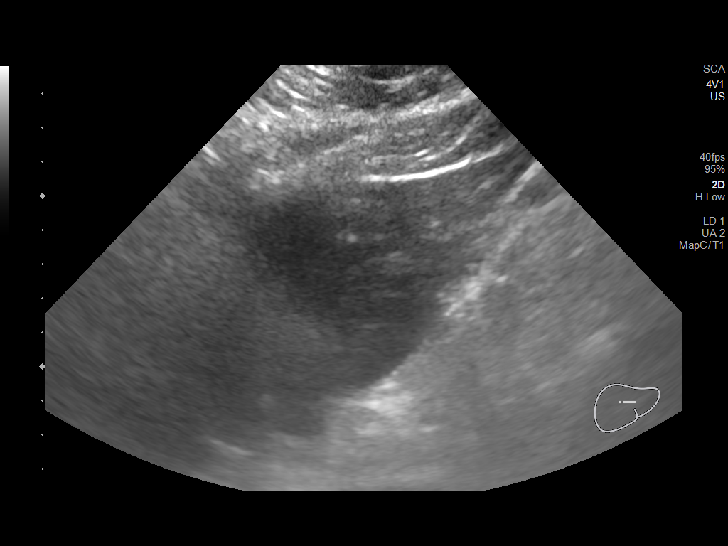
[im 20/24]
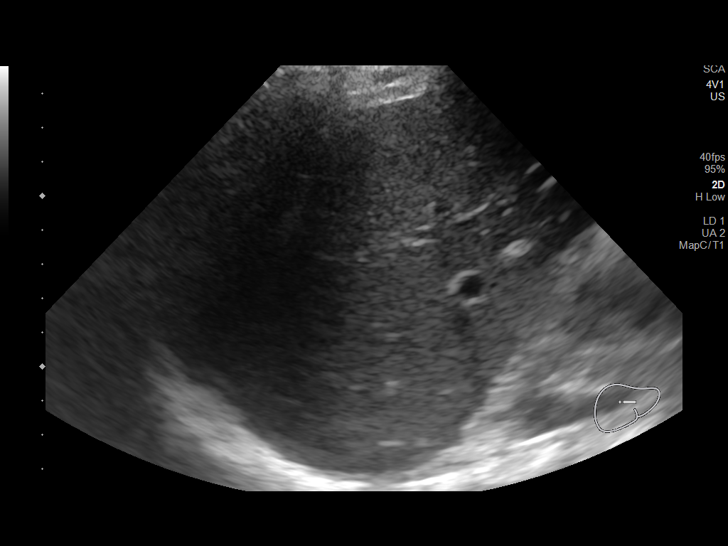
[im 22/24]
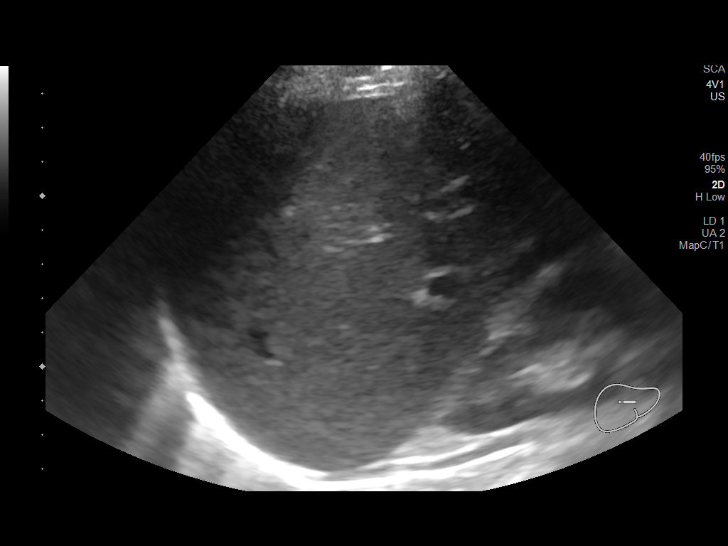
[im 24/24]
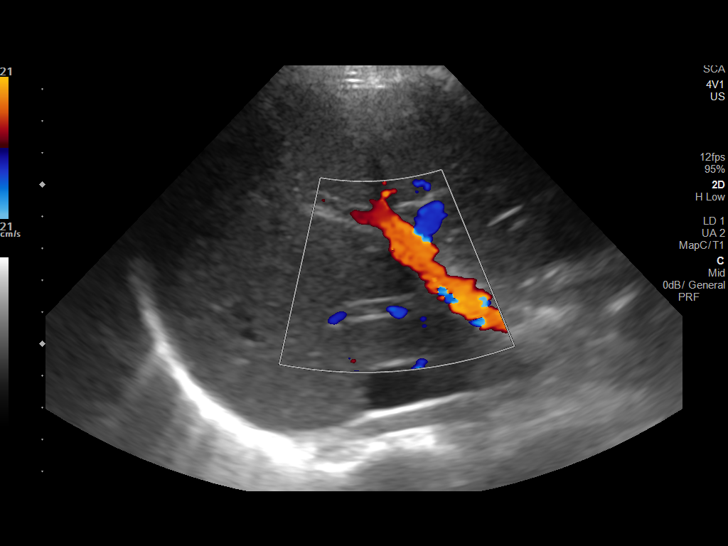

[14 of 24 positions shown; findings below may reference images not displayed]

FINDINGS: Gallbladder:

No gallstones or wall thickening visualized. No sonographic Murphy
sign noted by sonographer.

Common bile duct:

Diameter: 2.2 mm

Liver:

No focal lesion identified. Within normal limits in parenchymal
echogenicity. Portal vein is patent on color Doppler imaging with
normal direction of blood flow towards the liver.

Other: None.
IMPRESSION: Negative right upper quadrant abdominal ultrasound

## 2021-07-18 ENCOUNTER — Other Ambulatory Visit: Payer: Self-pay

## 2021-07-18 ENCOUNTER — Emergency Department (HOSPITAL_BASED_OUTPATIENT_CLINIC_OR_DEPARTMENT_OTHER): Payer: Medicaid Other | Admitting: Radiology

## 2021-07-18 ENCOUNTER — Emergency Department (HOSPITAL_BASED_OUTPATIENT_CLINIC_OR_DEPARTMENT_OTHER)
Admission: EM | Admit: 2021-07-18 | Discharge: 2021-07-18 | Disposition: A | Discharge status: Home / Self Care | Payer: Medicaid Other | Attending: Emergency Medicine | Admitting: Emergency Medicine

## 2021-07-18 ENCOUNTER — Ambulatory Visit
Admission: EM | Admit: 2021-07-18 | Discharge: 2021-07-18 | Disposition: A | Discharge status: Home / Self Care | Payer: Medicaid Other

## 2021-07-18 ENCOUNTER — Encounter (HOSPITAL_BASED_OUTPATIENT_CLINIC_OR_DEPARTMENT_OTHER): Payer: Self-pay

## 2021-07-18 DIAGNOSIS — Z1881 Retained glass fragments: Secondary | ICD-10-CM

## 2021-07-18 DIAGNOSIS — W25XXXA Contact with sharp glass, initial encounter: Secondary | ICD-10-CM | POA: Insufficient documentation

## 2021-07-18 DIAGNOSIS — Z23 Encounter for immunization: Secondary | ICD-10-CM | POA: Insufficient documentation

## 2021-07-18 DIAGNOSIS — S91322A Laceration with foreign body, left foot, initial encounter: Secondary | ICD-10-CM | POA: Insufficient documentation

## 2021-07-18 DIAGNOSIS — S99922A Unspecified injury of left foot, initial encounter: Secondary | ICD-10-CM | POA: Diagnosis present

## 2021-07-18 MED ORDER — TETANUS-DIPHTH-ACELL PERTUSSIS 5-2.5-18.5 LF-MCG/0.5 IM SUSY
0.5000 mL | PREFILLED_SYRINGE | Freq: Once | INTRAMUSCULAR | Status: AC
  Administered 2021-07-18: 0.5 mL via INTRAMUSCULAR
  Filled 2021-07-18: qty 0.5

## 2021-07-18 MED ORDER — LIDOCAINE-EPINEPHRINE (PF) 2 %-1:200000 IJ SOLN
10.0000 mL | Freq: Once | INTRAMUSCULAR | Status: AC
  Administered 2021-07-18: 10 mL
  Filled 2021-07-18: qty 20

## 2021-07-18 NOTE — ED Notes (Signed)
Patient is being discharged from the Urgent Care and sent to the Emergency Department via POA with family. Per L. Morgan-Scales PA-C ?, patient is in need of higher level of care due to need for further evaluation for foreign object in foot. Patient is aware and verbalizes understanding of plan of care.  ?Vitals:  ? 07/18/21 1802  ?BP: 107/74  ?Pulse: 70  ?Resp: 18  ?Temp: 98.9 ?F (37.2 ?C)  ?SpO2: 95%  ?  ?

## 2021-07-18 NOTE — ED Provider Notes (Signed)
Mom and patient redirected to the emergency room for x-ray and possible foreign body removal if foreign body present.  Mom advised we do not have radiology technician here at this location today. ?  ?Lynden Oxford Scales, PA-C ?07/18/21 1813 ? ?

## 2021-07-18 NOTE — Discharge Instructions (Addendum)
Utilize a postop shoe and crutches and weight-bear as tolerated.  Your sutures should stay in for roughly 7 days.  Watch for signs of developing infection would include redness, worsening pain, purulent discharge, swelling.  ?

## 2021-07-18 NOTE — ED Provider Notes (Signed)
?[facility] ?Provider Note ? ? ?CSN: [id number] ?Arrival date & time: 07/18/21  1834 ? ?  ? ?History ? ?Chief Complaint  ?Patient presents with  ? Foot Injury  ? ? ?Sean Kim is a 21 y.o. male. ? ? ?Foot Injury ? ?21 year old male presenting to the emergency department with a chief complaint of concern for glass in his foot.  The patient states that he stepped on glass and sustained a laceration to his foot 2 days ago.  He has been unable to remove it at home.  He endorses pain with attempted ambulation.  He went to urgent care initially and was sent here for evaluation and management.  His tetanus is not up-to-date.  He endorses persistent sharp pain in the plantar aspect of his left foot. ? ?Home Medications ?Prior to Admission medications   ?Medication Sig Start Date End Date Taking? Authorizing Provider  ?acetaminophen (TYLENOL) 500 MG tablet Take 1,000 mg by mouth every 6 (six) hours as needed for mild pain, fever or headache.    [provider]  ?Glucagon, rDNA, (GLUCAGON EMERGENCY IJ) Inject 1 mg as directed as needed (blood sugar).     [provider]  ?hydrocortisone (CORTEF) 10 MG tablet 2 tablets each morning and 1 tablet in early afternoon for next three days, then 1 tablet each morning once a day    [provider]  ?levothyroxine (SYNTHROID) 137 MCG tablet Take 137 mcg by mouth every morning. 03/13/19   [provider]  ?   ? ?Allergies    ?Patient has no known allergies.   ? ?Review of Systems   ?Review of Systems  ?Skin:  Positive for wound.  ? ?Physical Exam ?Updated Vital Signs ?BP 100/76   Pulse 70   Temp 98.9 ?F (37.2 ?C)   Resp 18   SpO2 100%  ?Physical Exam ?Vitals and nursing note reviewed.  ?Constitutional:   ?   General: He is not in acute distress. ?HENT:  ?   Head: Normocephalic and atraumatic.  ?Eyes:  ?   Conjunctiva/sclera: Conjunctivae normal.  ?   Pupils: Pupils are equal, round, and reactive to light.   ?Cardiovascular:  ?   Rate and Rhythm: Normal rate and regular rhythm.  ?Pulmonary:  ?   Effort: Pulmonary effort is normal. No respiratory distress.  ?Abdominal:  ?   General: There is no distension.  ?   Tenderness: There is no guarding.  ?Musculoskeletal:     ?   General: No deformity or signs of injury.  ?   Cervical back: Neck supple.  ?   Comments: Left foot along the plantar aspect with healing 0.5 cm wound with tenderness palpation, apparent shard of glass under the skin palpated  ?Skin: ?   Findings: No lesion or rash.  ?Neurological:  ?   General: No focal deficit present.  ?   Mental Status: He is alert. Mental status is at baseline.  ? ? ?ED Results / Procedures / Treatments   ?Labs ?(all labs ordered are listed, but only abnormal results are displayed) ?Labs Reviewed - No data to display ? ?EKG ?None ? ?Radiology ?DG Foot Complete Left ? ?Result Date: 07/18/2021 ?CLINICAL DATA:  Walked upon broken glass, evaluate for possible foreign body EXAM: LEFT FOOT - COMPLETE 3+ VIEW COMPARISON:  None Available. FINDINGS: No acute fracture or dislocation is noted. Tarsal degenerative changes are noted. No radiopaque foreign body is identified. There is a small contour defect in the plantar  soft tissues near the metatarsal heads with mild radiolucency. This could represent a radiolucent glass shard. IMPRESSION: No radiopaque foreign body is noted. Mild soft tissue contour defect is noted with increased lucency which may represent a small glass shard. Correlation with the clinical findings is recommended. Electronically Signed   By: Inez Catalina M.D.   On: 07/18/2021 19:30   ? ?Procedures ?Marland Kitchen.Laceration Repair ? ?Date/Time: 07/18/2021 10:24 PM ?Performed by: Regan Lemming, MD ?Authorized by: Regan Lemming, MD  ? ?Consent:  ?  Consent obtained:  Verbal ?  Consent given by:  Patient ?  Risks discussed:  Retained foreign body, pain and infection ?Universal protocol:  ?  Patient identity confirmed:  Verbally with  patient ?Anesthesia:  ?  Anesthesia method:  Local infiltration ?  Local anesthetic:  Lidocaine 1% WITH epi ?Laceration details:  ?  Location:  Foot ?  Foot location:  Sole of L foot ?  Length (cm):  0.5 ?Exploration:  ?  Limited defect created (wound extended): yes   ?  Hemostasis achieved with:  Epinephrine and direct pressure ?  Imaging obtained: x-ray   ?  Imaging outcome: foreign body noted   ?  Wound exploration: wound explored through full range of motion and entire depth of wound visualized   ?  Wound extent: foreign bodies/material   ?  Foreign bodies/material:  Small apparent shard of glass ?  Contaminated: no   ?Treatment:  ?  Area cleansed with:  Saline and Shur-Clens ?  Amount of cleaning:  Standard ?  Irrigation method:  Pressure wash ?Skin repair:  ?  Repair method:  Sutures ?  Suture size:  3-0 ?  Suture material:  Prolene ?  Suture technique:  Simple interrupted ?  Number of sutures:  3 ?Approximation:  ?  Approximation:  Close ?Repair type:  ?  Repair type:  Simple ?Post-procedure details:  ?  Dressing:  Bulky dressing ?  Procedure completion:  Tolerated  ? ? ?Medications Ordered in ED ?Medications  ?lidocaine-EPINEPHrine (XYLOCAINE W/EPI) 2 %-1:200000 (PF) injection 10 mL (10 mLs Infiltration Given by Other 07/18/21 2123)  ?Tdap (BOOSTRIX) injection 0.5 mL (0.5 mLs Intramuscular Given 07/18/21 2234)  ? ? ?ED Course/ Medical Decision Making/ A&P ?  ?                        ?Medical Decision Making ?Amount and/or Complexity of Data Reviewed ?Radiology: ordered. ? ?Risk ?Prescription drug management. ? ? ? ?21 year old male presenting to the emergency department with a chief complaint of concern for glass in his foot.  The patient states that he stepped on glass and sustained a laceration to his foot 2 days ago.  He has been unable to remove it at home.  He endorses pain with attempted ambulation.  He went to urgent care initially and was sent here for evaluation and management.  His tetanus is not  up-to-date.  He endorses persistent sharp pain in the plantar aspect of his left foot. ? ?On arrival, the patient was vitally stable.  X-ray imaging concerning for possible soft tissue defect Which could represent a glass shard.  He does appear to have a short of glass on exam.  His tetanus was updated.  The wound appears to be healing over the foreign body.  It was reopened and extensively explored with removal of tissue and possible glass shard noted.  The wound bed was fully explored and irrigated copiously.  On repeat examination, there  was no noted residual foreign body.  The wound was then closed with 3 Prolene sutures.  The patient was advised weightbearing as tolerated, provided postop shoe, crutches, advised follow-up for repeat assessment in 7 days and suture removal. ? ?Final Clinical Impression(s) / ED Diagnoses ?Final diagnoses:  ?Superficial glass foreign body  ? ? ?Rx / DC Orders ?ED Discharge Orders   ? ? None  ? ?  ? ? ?  ?Regan Lemming, MD ?07/19/21 2040 ? ?

## 2021-07-18 NOTE — ED Notes (Signed)
Patient verbalizes understanding of discharge instructions. Opportunity for questioning and answers were provided. Armband removed by staff, pt discharged from ED to home with family. Demonstrated crutches use prior to DC. ?

## 2021-07-18 NOTE — ED Triage Notes (Signed)
Pt c/o something broke off into his left foot Saturday.  ?

## 2021-07-18 NOTE — ED Triage Notes (Signed)
Pt sent here from [facility] to have glass removed from his Left foot. Pt was walking outside with out shoes, stepped on glass which broke off in his foot. Unable to remove it at home or UC  ?

## 2021-07-25 ENCOUNTER — Ambulatory Visit
Admission: RE | Admit: 2021-07-25 | Discharge: 2021-07-25 | Disposition: A | Discharge status: Home / Self Care | Payer: Medicaid Other | Source: Ambulatory Visit | Attending: Urgent Care | Admitting: Urgent Care

## 2021-07-25 VITALS — BP 126/78 | HR 74 | Temp 98.9°F | Resp 20

## 2021-07-25 DIAGNOSIS — S91302A Unspecified open wound, left foot, initial encounter: Secondary | ICD-10-CM | POA: Diagnosis not present

## 2021-07-25 DIAGNOSIS — L089 Local infection of the skin and subcutaneous tissue, unspecified: Secondary | ICD-10-CM | POA: Diagnosis not present

## 2021-07-25 MED ORDER — CEPHALEXIN 500 MG PO CAPS: 500.0000 mg | ORAL_CAPSULE | Freq: Three times a day (TID) | ORAL | 0 refills | Status: DC

## 2021-07-25 NOTE — ED Triage Notes (Signed)
Pt here for suture removal. 3 sutures placed at [facility] 07/18/2021. Wound has not been cleaned per patient. Pt has tenderness and drainage from wound.

## 2021-07-25 NOTE — Discharge Instructions (Signed)
Change your dressing 3-5 times daily. Every time you change your dressing, clean the wound gently with warm water and Dial antibacterial soap. Pat the wound dry, let it breathe for roughly an hour before covering it back up. When you reapply a dressing, use non-stick/non-adherent gauze. Apply Bacitracin ointment to the wound. After 4-7 days, the wound should scab over nicely and then you don't have to continue doing dressings. Take Keflex to address the wound infection.

## 2021-07-25 NOTE — ED Provider Notes (Signed)
[facility]   MRN: [id number] DOB: [birth year]  Subjective:   Sean Kim is a 21 y.o. male presenting for suture removal and wound check.  Patient suffered a puncture wound from the last 07/18/2021.  He has kept his wound covered with the same dressing that was applied when he went to the emergency room.  Has had pain and drainage.  No fever, nausea, vomiting.  No current facility-administered medications for this encounter.  Current Outpatient Medications:    acetaminophen (TYLENOL) 500 MG tablet, Take 1,000 mg by mouth every 6 (six) hours as needed for mild pain, fever or headache., Disp: , Rfl:    Glucagon, rDNA, (GLUCAGON EMERGENCY IJ), Inject 1 mg as directed as needed (blood sugar). , Disp: , Rfl:    hydrocortisone (CORTEF) 10 MG tablet, 2 tablets each morning and 1 tablet in early afternoon for next three days, then 1 tablet each morning once a day, Disp: , Rfl:    levothyroxine (SYNTHROID) 137 MCG tablet, Take 137 mcg by mouth every morning., Disp: , Rfl:    No Known Allergies  Past Medical History:  Diagnosis Date   ADHD    COVID-19 virus infection    Hypoglycemia    MR (mental retardation)    Pituitary abnormality (HCC)    Seizures ([place])    Stroke ([place])    Thyroid disease      Past Surgical History:  Procedure Laterality Date   EYE SURGERY     x2   TONSILLECTOMY      Family History  Problem Relation Age of Onset   Healthy Mother    Hypertension Maternal Grandmother     Social History   Tobacco Use   Smoking status: Never   Smokeless tobacco: Never  Vaping Use   Vaping Use: Never used  Substance Use Topics   Alcohol use: No   Drug use: No    ROS   Objective:   Vitals: BP 126/78   Pulse 74   Temp 98.9 F (37.2 C)   Resp 20   SpO2 98%   Physical Exam Constitutional:      General: He is not in acute distress.    Appearance: Normal appearance. He is well-developed and normal weight. He is not ill-appearing,  toxic-appearing or diaphoretic.  HENT:     Head: Normocephalic and atraumatic.     Right Ear: External ear normal.     Left Ear: External ear normal.     Nose: Nose normal.     Mouth/Throat:     Pharynx: Oropharynx is clear.  Eyes:     General: No scleral icterus.       Right eye: No discharge.        Left eye: No discharge.     Extraocular Movements: Extraocular movements intact.  Cardiovascular:     Rate and Rhythm: Normal rate.  Pulmonary:     Effort: Pulmonary effort is normal.  Musculoskeletal:     Cervical back: Normal range of motion.       Feet:  Neurological:     Mental Status: He is alert and oriented to person, place, and time.  Psychiatric:        Mood and Affect: Mood normal.        Behavior: Behavior normal.        Thought Content: Thought content normal.        Judgment: Judgment normal.    A wet dressing was applied using bacitracin, nonadherent gauze  and secured with Coban.  Assessment and Plan :   PDMP not reviewed this encounter.  1. Infected wound    Wound care provided.  Recommended starting Keflex for infected wound.  Counseled on wound care at home.  Use supportive care otherwise. Counseled patient on potential for adverse effects with medications prescribed/recommended today, ER and return-to-clinic precautions discussed, patient verbalized understanding.    Jaynee Eagles, [place] 07/25/21 [phone]

## 2021-08-09 ENCOUNTER — Encounter: Payer: Self-pay | Admitting: *Deleted

## 2022-01-14 ENCOUNTER — Ambulatory Visit (INDEPENDENT_AMBULATORY_CARE_PROVIDER_SITE_OTHER): Payer: Medicaid Other

## 2022-01-14 ENCOUNTER — Ambulatory Visit
Admission: RE | Admit: 2022-01-14 | Discharge: 2022-01-14 | Disposition: A | Discharge status: Home / Self Care | Payer: Medicaid Other | Source: Ambulatory Visit | Attending: Urgent Care | Admitting: Urgent Care

## 2022-01-14 ENCOUNTER — Ambulatory Visit: Admit: 2022-01-14 | Payer: Medicaid Other

## 2022-01-14 VITALS — BP 106/75 | HR 94 | Temp 98.1°F | Resp 16

## 2022-01-14 DIAGNOSIS — S93402A Sprain of unspecified ligament of left ankle, initial encounter: Secondary | ICD-10-CM | POA: Diagnosis not present

## 2022-01-14 DIAGNOSIS — M25572 Pain in left ankle and joints of left foot: Secondary | ICD-10-CM | POA: Diagnosis not present

## 2022-01-14 MED ORDER — NAPROXEN 500 MG PO TABS: 500.0000 mg | ORAL_TABLET | Freq: Two times a day (BID) | ORAL | 0 refills | Status: DC

## 2022-01-14 NOTE — ED Provider Notes (Signed)
[facility]  Note:  This document was prepared using Systems analyst and may include unintentional dictation errors.  MRN: [id number] DOB: 2001/02/24  Subjective:   Sean Kim is a 21 y.o. male presenting for 1 day history of acute onset left ankle pain with swelling.  Patient states that he was outside and accidentally stepped into a pothole which caused him to roll his ankle laterally.  He would like to make sure that he does not have any issues with this ankle as he has frequently had injuries to this area.  No current facility-administered medications for this encounter.  Current Outpatient Medications:    acetaminophen (TYLENOL) 500 MG tablet, Take 1,000 mg by mouth every 6 (six) hours as needed for mild pain, fever or headache., Disp: , Rfl:    cephALEXin (KEFLEX) 500 MG capsule, Take 1 capsule (500 mg total) by mouth 3 (three) times daily., Disp: 21 capsule, Rfl: 0   Glucagon, rDNA, (GLUCAGON EMERGENCY IJ), Inject 1 mg as directed as needed (blood sugar). , Disp: , Rfl:    hydrocortisone (CORTEF) 10 MG tablet, 2 tablets each morning and 1 tablet in early afternoon for next three days, then 1 tablet each morning once a day, Disp: , Rfl:    levothyroxine (SYNTHROID) 137 MCG tablet, Take 137 mcg by mouth every morning., Disp: , Rfl:    No Known Allergies  Past Medical History:  Diagnosis Date   ADHD    COVID-19 virus infection    Hypoglycemia    MR (mental retardation)    Pituitary abnormality (HCC)    Seizures ([place])    Stroke ([place])    Thyroid disease      Past Surgical History:  Procedure Laterality Date   EYE SURGERY     x2   TONSILLECTOMY      Family History  Problem Relation Age of Onset   Healthy Mother    Hypertension Maternal Grandmother     Social History   Tobacco Use   Smoking status: Never   Smokeless tobacco: Never  Vaping Use   Vaping Use: Never used  Substance Use Topics   Alcohol use: No   Drug  use: No    ROS   Objective:   Vitals: BP 106/75 (BP Location: Right Arm)   Pulse 94   Temp 98.1 F (36.7 C) (Oral)   Resp 16   SpO2 96%   Physical Exam Constitutional:      General: He is not in acute distress.    Appearance: Normal appearance. He is well-developed and normal weight. He is not ill-appearing, toxic-appearing or diaphoretic.  HENT:     Head: Normocephalic and atraumatic.     Right Ear: External ear normal.     Left Ear: External ear normal.     Nose: Nose normal.     Mouth/Throat:     Pharynx: Oropharynx is clear.  Eyes:     General: No scleral icterus.       Right eye: No discharge.        Left eye: No discharge.     Extraocular Movements: Extraocular movements intact.  Cardiovascular:     Rate and Rhythm: Normal rate.  Pulmonary:     Effort: Pulmonary effort is normal.  Musculoskeletal:     Cervical back: Normal range of motion.     Left ankle: Swelling present. No deformity, ecchymosis or lacerations. Tenderness present over the lateral malleolus, ATF ligament, AITF ligament and base of  5th metatarsal. No medial malleolus, CF ligament, posterior TF ligament or proximal fibula tenderness. Decreased range of motion.     Left Achilles Tendon: No tenderness or defects. Thompson's test negative.  Neurological:     Mental Status: He is alert and oriented to person, place, and time.  Psychiatric:        Mood and Affect: Mood normal.        Behavior: Behavior normal.        Thought Content: Thought content normal.        Judgment: Judgment normal.     DG Ankle Complete Left  Result Date: 01/14/2022 CLINICAL DATA:  Left ankle pain, twisted ankle yesterday EXAM: LEFT ANKLE COMPLETE - 3+ VIEW COMPARISON:  01/26/2020 FINDINGS: There is no evidence of fracture, dislocation, or joint effusion. There is no evidence of arthropathy or other focal bone abnormality. Mild soft tissue edema. IMPRESSION: No fracture or dislocation of the left ankle. Mild soft tissue  edema. Electronically Signed   By: Delanna Ahmadi M.D.   On: 01/14/2022 14:44    Left ankle wrapped using 4" Ace wrap in figure-8 method.  Assessment and Plan :   PDMP not reviewed this encounter.  1. Sprain of left ankle, unspecified ligament, initial encounter   2. Acute left ankle pain     Will manage for ankle sprain with rice method, NSAID.  Follow-up with an orthopedist as patient may benefit from physical therapy.  Counseled patient on potential for adverse effects with medications prescribed/recommended today, ER and return-to-clinic precautions discussed, patient verbalized understanding.    Jaynee Eagles, [place] 01/15/22 [phone]

## 2022-01-14 NOTE — ED Triage Notes (Signed)
Pt states he twisted left ankle yesterday-pain to left lateral ankle

## 2022-03-10 IMAGING — DX DG SHOULDER 2+V*L*
4 series · 4 of 4 positions shown · non-contrast
Comparison: None.

CLINICAL DATA: Fall, left shoulder pain

EXAM:
LEFT SHOULDER - 2+ VIEW

[shoulder ap]
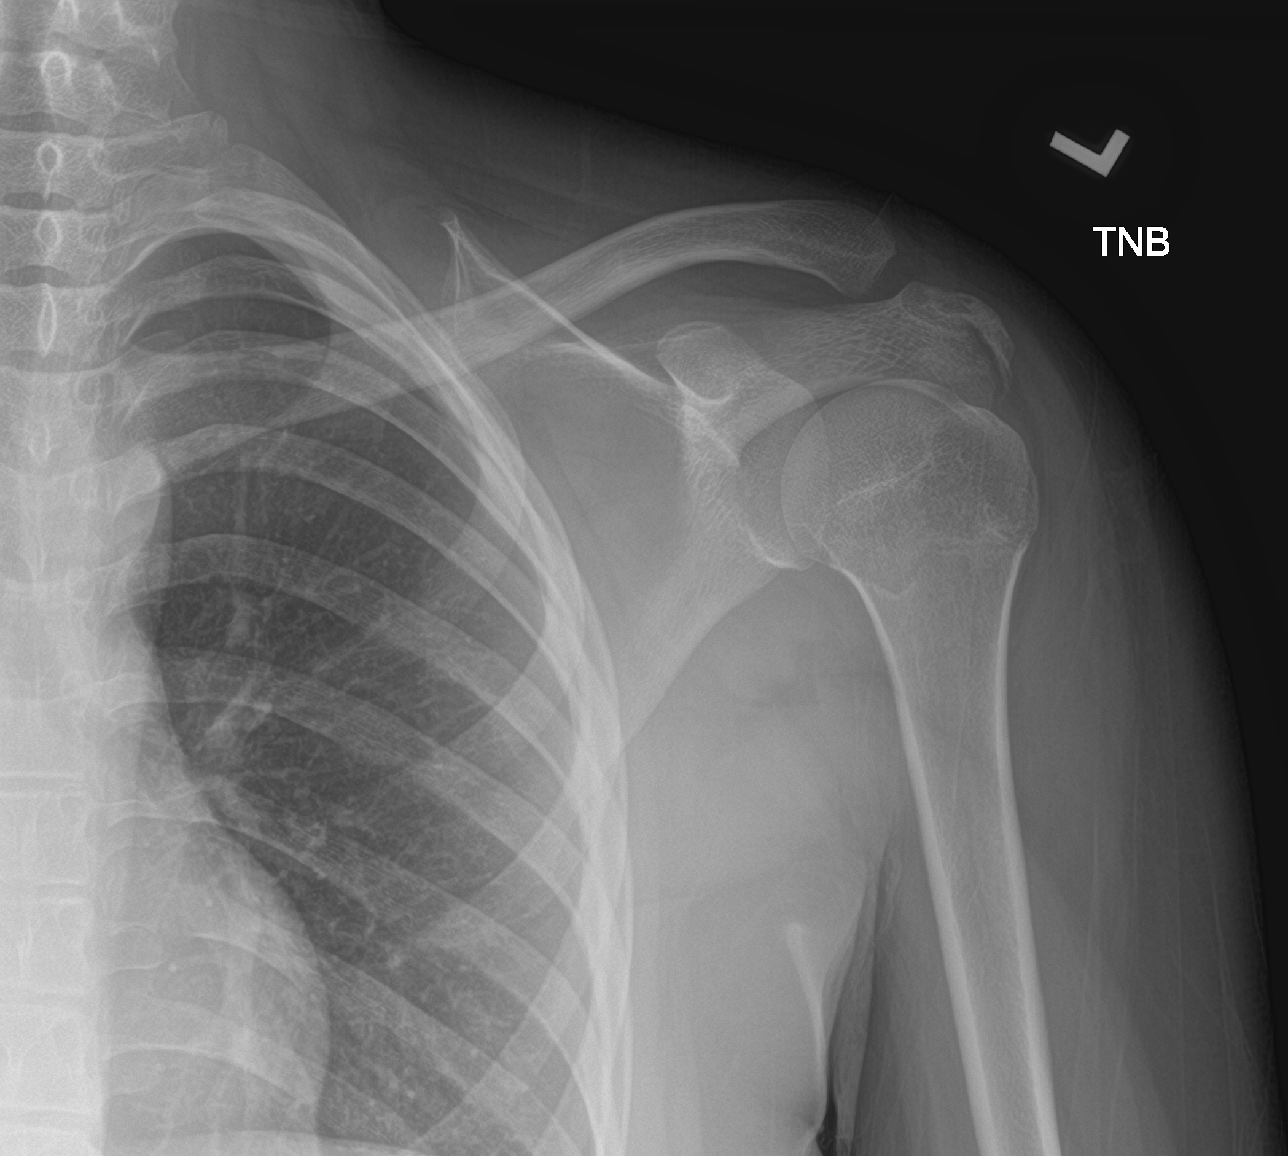

[shoulder grashey]
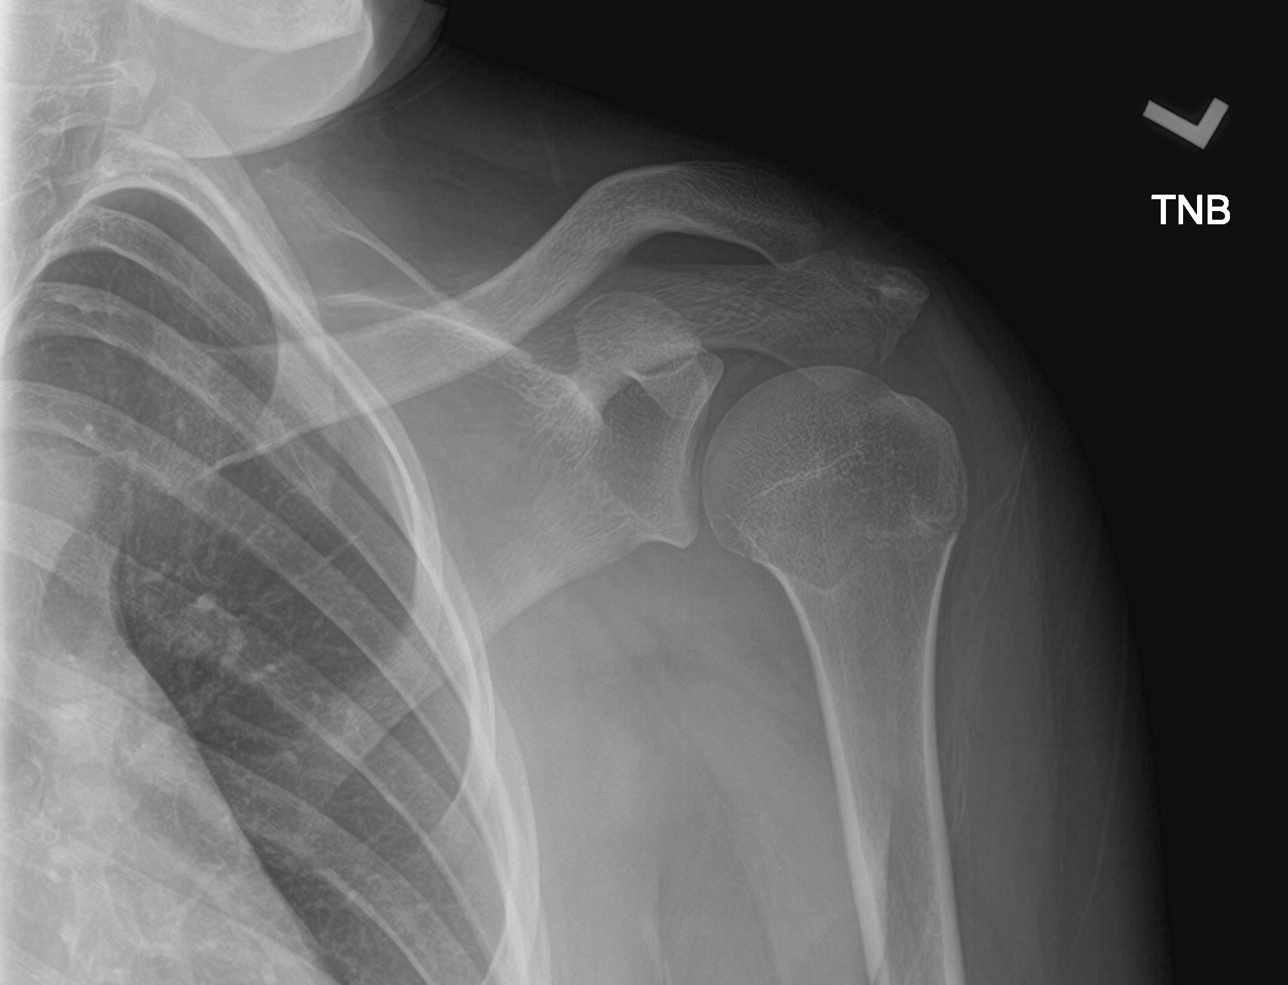

[shoulder axial]
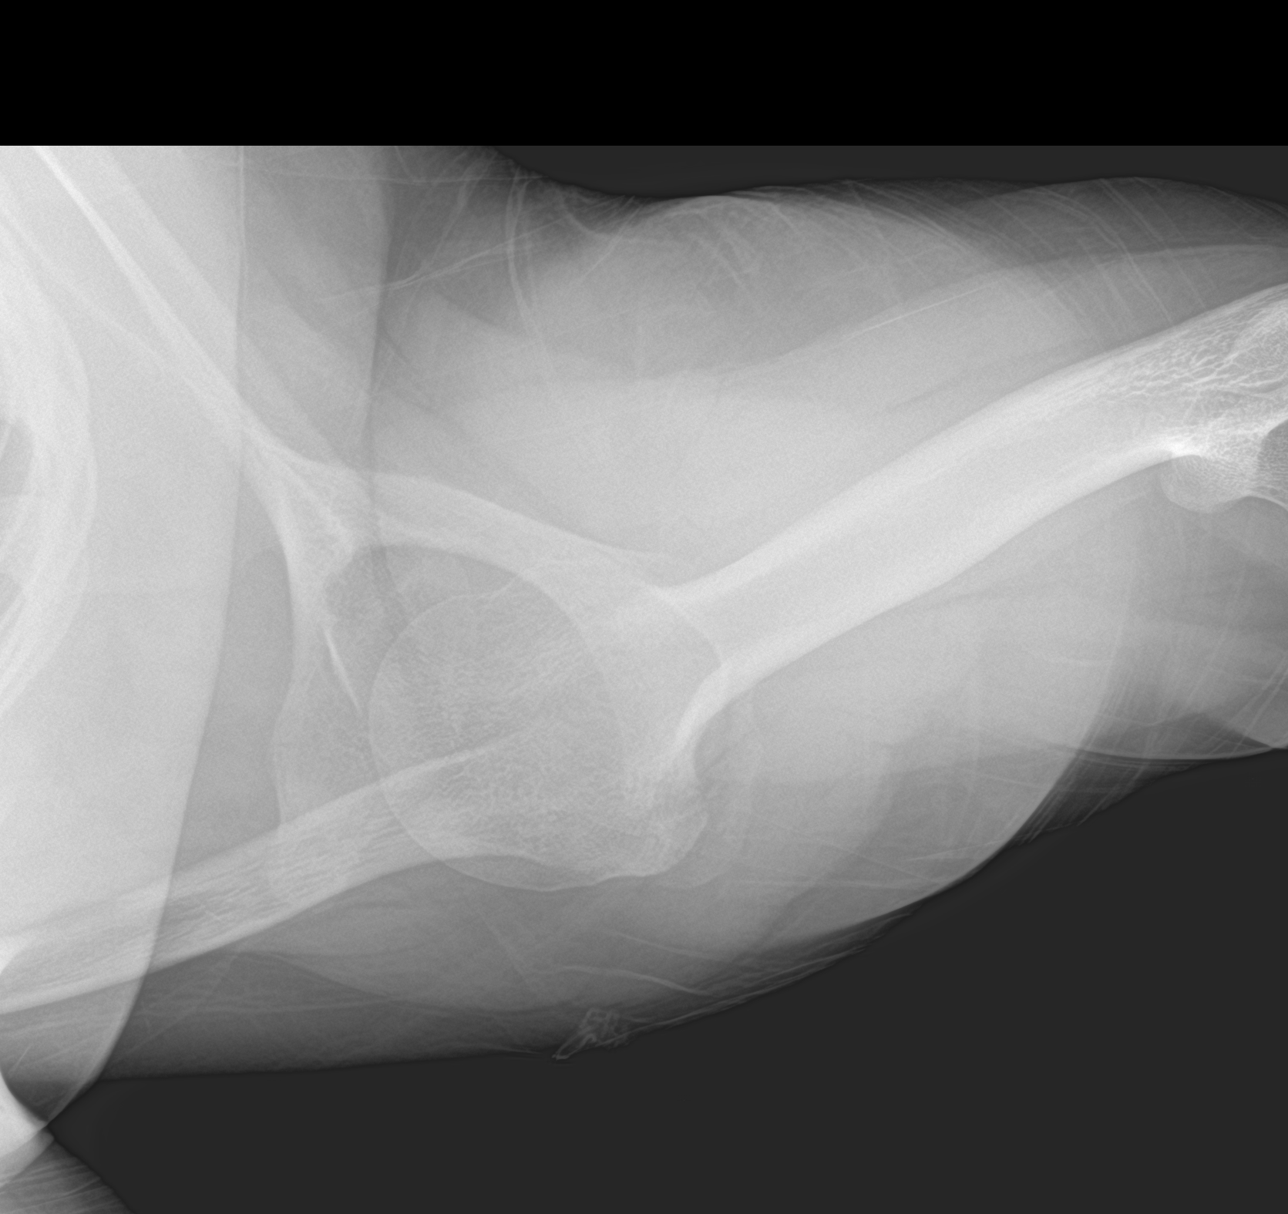

[shoulder y-view]
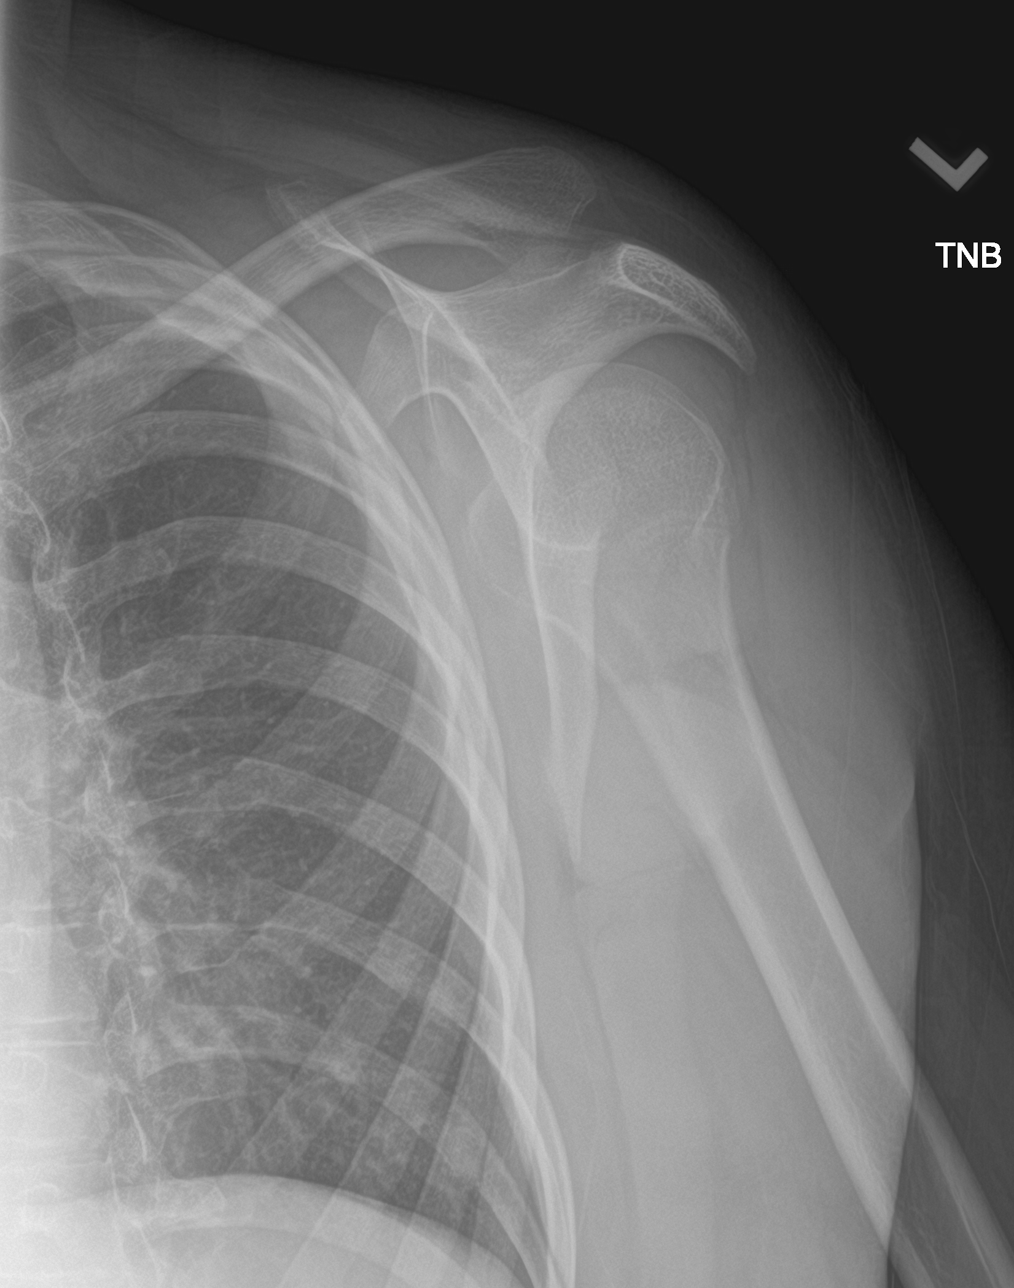

[4 of 4 positions shown; findings below may reference images not displayed]

FINDINGS: There is no evidence of fracture or dislocation. There is no
evidence of arthropathy or other focal bone abnormality. Soft
tissues are unremarkable.
IMPRESSION: Negative.

## 2022-03-16 ENCOUNTER — Ambulatory Visit (INDEPENDENT_AMBULATORY_CARE_PROVIDER_SITE_OTHER): Payer: Medicaid Other | Admitting: Student

## 2022-03-16 ENCOUNTER — Encounter: Payer: Self-pay | Admitting: Student

## 2022-03-16 VITALS — BP 101/80 | HR 71 | Wt 193.1 lb

## 2022-03-16 DIAGNOSIS — Z189 Retained foreign body fragments, unspecified material: Secondary | ICD-10-CM

## 2022-03-16 DIAGNOSIS — T162XXA Foreign body in left ear, initial encounter: Secondary | ICD-10-CM

## 2022-03-16 DIAGNOSIS — T161XXA Foreign body in right ear, initial encounter: Secondary | ICD-10-CM | POA: Insufficient documentation

## 2022-03-16 DIAGNOSIS — Z0000 Encounter for general adult medical examination without abnormal findings: Secondary | ICD-10-CM

## 2022-03-16 DIAGNOSIS — Z23 Encounter for immunization: Secondary | ICD-10-CM | POA: Diagnosis not present

## 2022-03-16 HISTORY — DX: Foreign body in right ear, initial encounter: T16.1XXA

## 2022-03-16 NOTE — Progress Notes (Signed)
    SUBJECTIVE:   Chief compliant/HPI: annual examination  Sean Kim is a 22 y.o. who presents today for an annual exam with brother in room and mother on phone.   Annual exam Sean Kim is a 22 year old male with past med history significant for cognitive defecits, hypopituitarism, hypothyroidism.  Mother is on phone, reports that she helps patient make his medical decisions.  Mother assists with making medical appointments and medications. Patient would like his ears checked- mother is concerned patient is having difficulty hearing. He thinks his vision is getting worse- has an eye doctor and mother will make appointment. He takes hydrocortisone for hypopituitarism, and thyroxine for hypothyroid.  He is followed closely by Dr. Lawernce Keas mother reports he has an appointment soon.  Social: No smoking, no alcohol, no illicit drug use.  Reviewed and updated history.  Review of systems form notable for trouble hearing and blurry vision.  OBJECTIVE:   BP 101/80   Pulse 71   Wt 193 lb 2 oz (87.6 kg)   SpO2 100%   BMI 28.52 kg/m    General: NAD, pleasant HEENT: Normocephalic atraumatic head.  EOM intact, normal conjunctiva.  Impacted cerumen and cotton swabs in bilateral middle ear.  Normal external nose.  Normal throat. Cardio: RRR, no MRG Respiratory: CTAB, normal wob on RA Neuro: Patient is able to follow commands and participate in neuroexam CN II: PERRL CN III, IV,VI: EOMI CV V: Normal sensation in V1, V2, V3 CVII: Symmetric smile and brow raise CN VIII: Normal hearing CN IX,X: Symmetric palate raise  CN XI: 5/5 shoulder shrug CN XII: Symmetric tongue protrusion  UE and LE strength 5/5 2+ LE reflexes  Normal sensation in UE and LE bilaterally  No ataxia with finger to nose  ASSESSMENT/PLAN:   Annual physical exam Overall patient is doing well, reports that he will be going to school soon.  No acute concerns aside from hearing, which is addressed in plan below. -  Updated flu shot - No hep C screening today - Benign physical exam (aside from ear exam as below) - Recommend visit with ophthalmologist (mother will make appointment) - Recommend close follow-up with endocrinology (Dr. Vonna Drafts reports appointment as soon  -Blood work from May 2023, okay, defer blood work to next endocrinology appointment  Retained foreign body of middle ear, bilateral Mild pain with reduced hearing in bilateral ears.  On exam there is impacted cerumen and retained cotton swabs and middle ear.  At this time believe patient will need specialty care to remove foreign object.  Counseled mother/patient to remove cotton swabs from home, and to avoid putting objects in ear. -Ambulatory referral to ENT  Mother was updated by phone during this appointment, who expressed agreement understanding with plan.  Leslie Dales, [employer]

## 2022-03-16 NOTE — Assessment & Plan Note (Addendum)
Overall patient is doing well, reports that he will be going to school soon.  No acute concerns aside from hearing, which is addressed in plan below. - Updated flu shot - No hep C screening today - Benign physical exam (aside from ear exam as below) - Recommend visit with ophthalmologist (mother will make appointment) - Recommend close follow-up with endocrinology (Dr. Vonna Drafts reports appointment as soon  -Blood work from May 2023, okay, defer blood work to next endocrinology appointment

## 2022-03-16 NOTE — Patient Instructions (Signed)
It was great to see you! Thank you for allowing me to participate in your care!   I recommend that you always bring your medications to each appointment as this makes it easy to ensure we are on the correct medications and helps [place] not miss when refills are needed.  Our plans for today:  - I have made a referral to ENT to get your ears cleaned - Please make appointment to see eye doctor - Please do not place anything into ears - Follow-up with Dr. Buddy Duty   Take care and seek immediate care sooner if you develop any concerns. Please remember to show up 15 minutes before your scheduled appointment time!  Leslie Dales, DO [facility]

## 2022-03-16 NOTE — Assessment & Plan Note (Signed)
Mild pain with reduced hearing in bilateral ears.  On exam there is impacted cerumen and retained cotton swabs and middle ear.  At this time believe patient will need specialty care to remove foreign object.  Counseled mother/patient to remove cotton swabs from home, and to avoid putting objects in ear. -Ambulatory referral to ENT

## 2022-04-11 ENCOUNTER — Encounter: Payer: Self-pay | Admitting: Nurse Practitioner

## 2022-05-19 ENCOUNTER — Ambulatory Visit
Admission: RE | Admit: 2022-05-19 | Discharge: 2022-05-19 | Disposition: A | Discharge status: Home / Self Care | Payer: Medicaid Other | Source: Ambulatory Visit | Attending: Family Medicine | Admitting: Family Medicine

## 2022-05-19 VITALS — BP 113/79 | HR 63 | Temp 97.9°F | Resp 16

## 2022-05-19 DIAGNOSIS — M542 Cervicalgia: Secondary | ICD-10-CM | POA: Diagnosis not present

## 2022-05-19 MED ORDER — IBUPROFEN 800 MG PO TABS: 800.0000 mg | ORAL_TABLET | Freq: Three times a day (TID) | ORAL | 0 refills | Status: DC | PRN

## 2022-05-19 MED ORDER — TIZANIDINE HCL 4 MG PO TABS
4.0000 mg | ORAL_TABLET | Freq: Every evening | ORAL | 0 refills | Status: DC | PRN
Start: 2022-05-19 — End: 2022-05-31

## 2022-05-19 NOTE — ED Triage Notes (Signed)
Reports he slipped in the bath tub today and hit his neck, denies hitting his head. Mother reports he's been sleeping more since the incident this morning, but is otherwise appearing at baseline. Patient states he now has pain with moving his neck.

## 2022-05-19 NOTE — ED Provider Notes (Signed)
Sean Kim    CSN: [id number] Arrival date & time: 05/19/22  1640      History   Chief Complaint Chief Complaint  Patient presents with   Neck Injury    fell in tub - Entered by patient    HPI Sean Kim is a 22 y.o. male.    Neck Injury   Here for left sided neck pain. He slipped and fell in the tub today. Uncertain what he struck his neck on. States he did not hit his head, and did not have any LOC. Lost his balance.  No f/c/cough.  No paresthesia, muscle weakness, numbness. 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   Caregiver burden 07/05/2019   History of attempted suicide 07/05/2019   Constipation 11/12/2018   Abnormal EKG 11/12/2018   Encephalopathy due to COVID-19 virus 10/18/2018   Elevated alkaline phosphatase level 05/29/2018   Annual physical exam 10/31/2017   Healthy adult on routine physical examination 10/31/2017   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 Authorizing Provider  hydrocortisone (CORTEF) 10 MG tablet 2 tablets each morning and 1 tablet in early afternoon for next three days, then 1 tablet each morning once a day   Yes [provider]  ibuprofen (ADVIL) 800 MG tablet Take 1 tablet (800 mg total) by mouth every 8 (eight) hours as needed (pain). 05/19/22  Yes  Barrett Henle, MD  levothyroxine (SYNTHROID) 137 MCG tablet Take 137 mcg by mouth every morning. 03/13/19  Yes [provider]  tiZANidine (ZANAFLEX) 4 MG tablet Take 1 tablet (4 mg total) by mouth at bedtime as needed for muscle spasms. 05/19/22  Yes Amiria Orrison, Gwenlyn Perking, MD  Glucagon, rDNA, (GLUCAGON EMERGENCY IJ) Inject 1 mg as directed as needed (blood sugar).     [provider]    Family History Family History  Problem Relation Age of Onset   Healthy Mother    Hypertension Maternal Grandmother     Social History Social History   Tobacco Use   Smoking status: Never   Smokeless tobacco: Never  Vaping Use   Vaping Use: Never used  Substance Use Topics   Alcohol use: No   Drug use: No     Allergies   Patient has no known allergies.   Review of Systems Review of Systems   Physical Exam Triage Vital Signs ED Triage Vitals  Enc Vitals Group     BP 05/19/22 1705 113/79     Pulse Rate 05/19/22 1705 63     Resp 05/19/22 1705 16     Temp 05/19/22 1705 97.9 F (36.6 C)     Temp Source 05/19/22 1705 Oral  SpO2 05/19/22 1705 100 %     Weight --      Height --      Head Circumference --      Peak Flow --      Pain Score 05/19/22 1709 9     Pain Loc --      Pain Edu? --      Excl. in [place]? --    No data found.  Updated Vital Signs BP 113/79 (BP Location: Left Arm)   Pulse 63   Temp 97.9 F (36.6 C) (Oral)   Resp 16   SpO2 100%   Visual Acuity Right Eye Distance:   Left Eye Distance:   Bilateral Distance:    Right Eye Near:   Left Eye Near:    Bilateral Near:     Physical Exam Vitals reviewed.  Constitutional:      General: He is not in acute distress.    Appearance: He is not ill-appearing, toxic-appearing or diaphoretic.     Comments: He is alert and calm in the room and gives his own history.  Speech is coherent and goal-directed.  HENT:     Mouth/Throat:     Mouth: Mucous membranes are moist.  Eyes:     Extraocular  Movements: Extraocular movements intact.     Conjunctiva/sclera: Conjunctivae normal.     Pupils: Pupils are equal, round, and reactive to light.  Neck:     Comments: There is no tenderness over the spinous processes.  He is a little tender on the left musculature of the neck.  Also is tender onto the left trapezius.  No deformity and no erythema or ecchymosis Cardiovascular:     Rate and Rhythm: Normal rate and regular rhythm.     Heart sounds: No murmur heard. Pulmonary:     Effort: Pulmonary effort is normal.     Breath sounds: Normal breath sounds.  Musculoskeletal:     Cervical back: Neck supple.  Lymphadenopathy:     Cervical: No cervical adenopathy.  Skin:    Coloration: Skin is not jaundiced or pale.  Neurological:     General: No focal deficit present.     Mental Status: He is alert and oriented to person, place, and time.     Cranial Nerves: No cranial nerve deficit.     Sensory: No sensory deficit.     Motor: No weakness.     Gait: Gait normal.  Psychiatric:        Behavior: Behavior normal.      UC Treatments / Results  Labs (all labs ordered are listed, but only abnormal results are displayed) Labs Reviewed - No data to display  EKG   Radiology No results found.  Procedures Procedures (including critical Kim time)  Medications Ordered in UC Medications - No data to display  Initial Impression / Assessment and Plan / UC Course  I have reviewed the triage vital signs and the nursing notes.  Pertinent labs & imaging results that were available during my Kim of the patient were reviewed by me and considered in my medical decision making (see chart for details).       With this being a ground-level fall, I doubt that he needs imaging.  I think this is a contusion, and ibuprofen and tizanidine are sent in to relieve symptoms.  Final Clinical Impressions(s) / UC Diagnoses   Final diagnoses:  Neck pain on left side     Discharge Instructions  Take ibuprofen 800 mg--1 tab every 8 hours as needed for pain.   Take tizanidine 4 mg--1 at bedtime as needed for muscle spasms; this medication can cause dizziness and sleepiness      ED Prescriptions     Medication Sig Dispense Auth. Provider   ibuprofen (ADVIL) 800 MG tablet Take 1 tablet (800 mg total) by mouth every 8 (eight) hours as needed (pain). 21 tablet Audrena Talaga, Gwenlyn Perking, MD   tiZANidine (ZANAFLEX) 4 MG tablet Take 1 tablet (4 mg total) by mouth at bedtime as needed for muscle spasms. 5 tablet Jazlene Bares, Gwenlyn Perking, MD      I have reviewed the PDMP during this encounter.   Barrett Henle, MD 05/19/22 [phone]

## 2022-05-19 NOTE — Discharge Instructions (Signed)
Take ibuprofen 800 mg--1 tab every 8 hours as needed for pain.   Take tizanidine 4 mg--1 at bedtime as needed for muscle spasms; this medication can cause dizziness and sleepiness

## 2022-05-31 ENCOUNTER — Encounter: Payer: Self-pay | Admitting: Student

## 2022-05-31 ENCOUNTER — Ambulatory Visit
Admission: RE | Admit: 2022-05-31 | Discharge: 2022-05-31 | Disposition: A | Discharge status: Home / Self Care | Payer: Medicaid Other | Source: Ambulatory Visit | Attending: Family Medicine | Admitting: Family Medicine

## 2022-05-31 ENCOUNTER — Ambulatory Visit (INDEPENDENT_AMBULATORY_CARE_PROVIDER_SITE_OTHER): Payer: Medicaid Other | Admitting: Student

## 2022-05-31 VITALS — BP 102/64 | HR 70 | Ht 69.0 in | Wt 196.6 lb

## 2022-05-31 DIAGNOSIS — M542 Cervicalgia: Secondary | ICD-10-CM | POA: Insufficient documentation

## 2022-05-31 HISTORY — DX: Cervicalgia: M54.2

## 2022-05-31 NOTE — Assessment & Plan Note (Addendum)
Suspect lingering musculoskeletal pain, do not suspect fracture nor accompanying neurological process.  Will obtain XR for peace of mind given family concerns, otherwise would recommend conservative management and physical therapy if not completely resolved in 1 to 2 weeks.

## 2022-05-31 NOTE — Progress Notes (Signed)
  SUBJECTIVE:   CHIEF COMPLAINT / HPI:   ED follow-up: Seen on 05/19/2022 in the ED after slipping in the tub and hitting his neck.  Patient did not have any imaging completed and was provided ibuprofen 800 mg 3 times daily as needed and tizanidine 4 mg nightly as needed. Patient endorses taking the medications which did help with the pain but did not completely resolve the pain. States he is having trouble sleeping. Eating/drinking normally, denies LOC, normally appearing at home per mother who is present during visit.  Denies radiating pain down either upper extremity.  Mother present during encounter and partial historian.  PERTINENT  PMH / PSH: N/A  Patient Care Team: Donney Dice, DO as PCP - General (Family Medicine) OBJECTIVE:  BP 102/64   Pulse 70   Ht 5\' 9"  (1.753 m)   Wt 196 lb 9.6 oz (89.2 kg)   SpO2 98%   BMI 29.03 kg/m  General: Well-appearing, NAD MSK: No appreciable asymmetry of the neck or bruising/erythema, point tenderness along C5/6 left transverse process, F ROM in rotation, sidebending, flexion; limited extension  ASSESSMENT/PLAN:  Pain of neck with recent traumatic injury Assessment & Plan: Suspect lingering musculoskeletal pain, do not suspect fracture nor accompanying neurological process.  Will obtain XR for peace of mind given family concerns, otherwise would recommend conservative management and physical therapy if not completely resolved in 1 to 2 weeks.  Orders: -     DG Cervical Spine Complete; Future  Return if symptoms worsen or fail to improve. Wells Guiles, DO 05/31/2022, 11:46 AM PGY-2, [place]

## 2022-05-31 NOTE — Patient Instructions (Signed)
It was great to see you today! Thank you for choosing [facility] for your primary care. Sean Kim was seen for ED follow-up.  Today we addressed: Lets get a x-ray of your neck.  If this is normal, I would recommend continuing over-the-counter medications and if this does not continue to improve in the next 1 to 2 weeks, we can consider physical therapy.  If you haven't already, sign up for My Chart to have easy access to your labs results, and communication with your primary care physician.  Call the clinic at [phone] if your symptoms worsen or you have any concerns.  You should return to our clinic Return if symptoms worsen or fail to improve. Please arrive 15 minutes before your appointment to ensure smooth check in process.  We appreciate your efforts in making this happen.  Thank you for allowing me to participate in your care, Wells Guiles, DO 05/31/2022, 11:10 AM PGY-2, [place]

## 2022-06-16 ENCOUNTER — Telehealth: Payer: BLUE CROSS/BLUE SHIELD | Admitting: Emergency Medicine

## 2022-06-16 ENCOUNTER — Encounter: Payer: Self-pay | Admitting: Emergency Medicine

## 2022-06-16 DIAGNOSIS — H10022 Other mucopurulent conjunctivitis, left eye: Secondary | ICD-10-CM | POA: Diagnosis not present

## 2022-06-16 MED ORDER — POLYMYXIN B-TRIMETHOPRIM 10000-0.1 UNIT/ML-% OP SOLN: 1.0000 [drp] | OPHTHALMIC | 0 refills | Status: AC

## 2022-06-16 NOTE — Progress Notes (Signed)
Virtual Visit Consent   Sean Kim, you are scheduled for a virtual visit with a [place] provider today. Just as with appointments in the office, your consent must be obtained to participate. Your consent will be active for this visit and any virtual visit you may have with one of our providers in the next 365 days. If you have a MyChart account, a copy of this consent can be sent to you electronically.  As this is a virtual visit, video technology does not allow for your provider to perform a traditional examination. This may limit your provider's ability to fully assess your condition. If your provider identifies any concerns that need to be evaluated in person or the need to arrange testing (such as labs, EKG, etc.), we will make arrangements to do so. Although advances in technology are sophisticated, we cannot ensure that it will always work on either your end or our end. If the connection with a video visit is poor, the visit may have to be switched to a telephone visit. With either a video or telephone visit, we are not always able to ensure that we have a secure connection.  By engaging in this virtual visit, you consent to the provision of healthcare and authorize for your insurance to be billed (if applicable) for the services provided during this visit. Depending on your insurance coverage, you may receive a charge related to this service.  I need to obtain your verbal consent now. Are you willing to proceed with your visit today? Sean Kim has provided verbal consent on 06/16/2022 for a virtual visit (video or telephone). Sean Horseman, PA-C  Date: 06/16/2022 11:10 AM  Virtual Visit via Video Note   I, Sean Kim, connected with  Sean Kim  ([id number], [birth year]) on 06/16/22 at 11:00 AM EDT by a video-enabled telemedicine application and verified that I am speaking with the correct person using two identifiers.  Location: Patient: Virtual Visit Location Patient:  Home Provider: Virtual Visit Location Provider: Home Office   I discussed the limitations of evaluation and management by telemedicine and the availability of in person appointments. The patient expressed understanding and agreed to proceed.    History of Present Illness: Sean Kim is a 22 y.o. who identifies as a male who was assigned male at birth, and is being seen today for left eye swelling, irritation, redness, and itching.  Onset this morning.  Denies any other symptoms.  Wears glasses.  Nobody else sick at home.  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   Caregiver burden 07/05/2019   History of attempted suicide 07/05/2019   Constipation 11/12/2018   Abnormal EKG 11/12/2018   Encephalopathy due to COVID-19 virus 10/18/2018   Elevated alkaline phosphatase level 05/29/2018   Annual physical exam 10/31/2017   Healthy adult on routine physical examination 10/31/2017   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Cognitive deficits    Intermittent left side weakness    Other cerebral infarction    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 , Disp: , Rfl:    hydrocortisone (CORTEF) 10 MG tablet, 2 tablets each morning and 1 tablet in early afternoon for next three days, then 1 tablet each morning once a day, Disp: , Rfl:    levothyroxine (SYNTHROID) 137 MCG tablet, Take 137 mcg by mouth every morning., Disp: , Rfl:   Observations/Objective: Patient is well-developed, well-nourished in no acute distress.  Resting  comfortably at home.  Head is normocephalic, atraumatic.  Left conjunctival erythema, tearing, and mild eyelid swelling No labored breathing.  Speech is clear and coherent with logical content.  Patient is alert and oriented at baseline.    Assessment and Plan: 1. Pink eye disease of left eye   Meds ordered this encounter  Medications   trimethoprim-polymyxin b (POLYTRIM) ophthalmic solution    Sig: Place 1 drop into both eyes every 4 (four) hours for 5 days.    Dispense:  10 mL    Refill:  0    Order Specific Question:   Supervising Provider    Answer:   Merrilee Jansky [id number]     Follow Up Instructions: I discussed the assessment and treatment plan with the patient. The patient was provided an opportunity to ask questions and all were answered. The patient agreed with the plan and demonstrated an understanding of the instructions.  A copy of instructions were sent to the patient via MyChart unless otherwise noted below.     The patient was advised to call back or seek an in-person evaluation if the symptoms worsen or if the condition fails to improve as anticipated.  Time:  I spent 10 minutes with the patient via telehealth technology discussing the above problems/concerns.    Sean Horseman, PA-C

## 2022-06-16 NOTE — Patient Instructions (Signed)
  Domingo Cocking, thank you for joining Roxy Horseman, PA-C for today's virtual visit.  While this provider is not your primary care provider (PCP), if your PCP is located in our provider database this encounter information will be shared with them immediately following your visit.   A [facility] MyChart account gives you access to today's visit and all your visits, tests, and labs performed at [facility] " click here if you don't have a [facility] MyChart account or go to [email or web address]  Consent: (Patient) Sean Kim provided verbal consent for this virtual visit at the beginning of the encounter.  Current Medications:  Current Outpatient Medications:    trimethoprim-polymyxin b (POLYTRIM) ophthalmic solution, Place 1 drop into both eyes every 4 (four) hours for 5 days., Disp: 10 mL, Rfl: 0   Glucagon, rDNA, (GLUCAGON EMERGENCY IJ), Inject 1 mg as directed as needed (blood sugar). , Disp: , Rfl:    hydrocortisone (CORTEF) 10 MG tablet, 2 tablets each morning and 1 tablet in early afternoon for next three days, then 1 tablet each morning once a day, Disp: , Rfl:    levothyroxine (SYNTHROID) 137 MCG tablet, Take 137 mcg by mouth every morning., Disp: , Rfl:    Medications ordered in this encounter:  Meds ordered this encounter  Medications   trimethoprim-polymyxin b (POLYTRIM) ophthalmic solution    Sig: Place 1 drop into both eyes every 4 (four) hours for 5 days.    Dispense:  10 mL    Refill:  0    Order Specific Question:   Supervising Provider    Answer:   Merrilee Jansky [[id number]]     *If you need refills on other medications prior to your next appointment, please contact your pharmacy*  Follow-Up: Call back or seek an in-person evaluation if the symptoms worsen or if the condition fails to improve as anticipated.  [facility] [phone]  Other Instructions Don't wear contacts while using the drops.   If you have been  instructed to have an in-person evaluation today at a local Urgent Care facility, please use the link below. It will take you to a list of all of our available [facility], including address, phone number and hours of operation. Please do not delay care.  [facility]  If you or a family member do not have a primary care provider, use the link below to schedule a visit and establish care. When you choose a [facility] primary care physician or advanced practice provider, you gain a long-term partner in health. Find a Primary Care Provider  Learn more about [facility]'s in-office and virtual care options: [facility] - Get Care Now

## 2022-07-26 ENCOUNTER — Telehealth: Payer: Self-pay

## 2022-07-26 NOTE — Telephone Encounter (Signed)
Mother LVM on nurse line requesting to schedule an appointment for chest discomfort.   Returned call to mother. She reports that patient began to complain of burning in chest yesterday evening after eating a large meal. She reports that he also laid down soon after eating. She gave pepto bismol. Patient reported improvement in symptoms after taking medication.   Scheduled for evaluation tomorrow afternoon with Dr. Robyne Peers. Supportive measures and ED precautions discussed.   Veronda Prude, RN

## 2022-07-27 ENCOUNTER — Ambulatory Visit: Payer: Medicaid Other | Admitting: Family Medicine

## 2022-10-22 ENCOUNTER — Telehealth: Payer: BLUE CROSS/BLUE SHIELD | Admitting: Nurse Practitioner

## 2022-10-22 DIAGNOSIS — R6889 Other general symptoms and signs: Secondary | ICD-10-CM

## 2022-10-22 MED ORDER — OSELTAMIVIR PHOSPHATE 75 MG PO CAPS: 75.0000 mg | ORAL_CAPSULE | Freq: Two times a day (BID) | ORAL | 0 refills | Status: DC

## 2022-10-22 NOTE — Progress Notes (Signed)

## 2022-10-22 NOTE — Progress Notes (Signed)
I have spent 5 minutes in review of e-visit questionnaire, review and updating patient chart, medical decision making and response to patient.  ° °Zelda W Fleming, NP ° °  °

## 2022-11-04 ENCOUNTER — Ambulatory Visit: Payer: BLUE CROSS/BLUE SHIELD

## 2022-11-04 ENCOUNTER — Ambulatory Visit: Payer: Self-pay

## 2022-11-04 VITALS — BP 110/74 | HR 65 | Temp 98.7°F | Resp 16 | Ht 69.0 in | Wt 209.0 lb

## 2022-11-04 DIAGNOSIS — S99912A Unspecified injury of left ankle, initial encounter: Secondary | ICD-10-CM | POA: Diagnosis not present

## 2022-11-04 DIAGNOSIS — M25572 Pain in left ankle and joints of left foot: Secondary | ICD-10-CM

## 2022-11-04 DIAGNOSIS — S99922A Unspecified injury of left foot, initial encounter: Secondary | ICD-10-CM

## 2022-11-04 DIAGNOSIS — M79672 Pain in left foot: Secondary | ICD-10-CM

## 2022-11-04 NOTE — ED Triage Notes (Signed)
He hurt his left foot or ankle when roller skating . He rolled into his sister and fell. - Entered by patient. DOI: [id number]. Some swelling noted on inside of left ankle. No bruising. No decreased ROM. No laceration. No abrasions.

## 2022-11-04 NOTE — ED Provider Notes (Signed)
[facility]    CSN: [id number] Arrival date & time: 11/04/22  1158      History   Chief Complaint Chief Complaint  Patient presents with   Foot Injury    Appt    HPI Sean Kim is a 22 y.o. male.   Patient here today for evaluation of left foot and ankle pain after he accidentally ran into his sister yesterday skating.  He states that he is unsure how his ankle rolled but he has had ankle sprains in the past.  He reports some swelling but no bruising.  He does report some pain with weightbearing.  He does not report any treatment for symptoms.  The history is provided by the patient.  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   Caregiver burden 07/05/2019   History of attempted suicide 07/05/2019   Constipation 11/12/2018   Abnormal EKG 11/12/2018   Encephalopathy due to COVID-19 virus 10/18/2018   Elevated alkaline phosphatase level 05/29/2018   Annual physical exam 10/31/2017   Healthy adult on routine physical examination 10/31/2017   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 Authorizing Provider  hydrocortisone (CORTEF) 10 MG tablet 2 tablets each morning and 1 tablet in early afternoon for next three days, then 1  tablet each morning once a day   Yes [provider]  levothyroxine (SYNTHROID) 137 MCG tablet Take 137 mcg by mouth every morning. 03/13/19  Yes [provider]  Glucagon, rDNA, (GLUCAGON EMERGENCY IJ) Inject 1 mg as directed as needed (blood sugar).     [provider]  oseltamivir (TAMIFLU) 75 MG capsule Take 1 capsule (75 mg total) by mouth 2 (two) times daily. 10/22/22   Claiborne Rigg, NP    Family History Family History  Problem Relation Age of Onset   Healthy Mother    Hypertension Maternal Grandmother     Social History Social History   Tobacco Use   Smoking status: Never   Smokeless tobacco: Never  Vaping Use   Vaping status: Never Used  Substance Use Topics   Alcohol use: No   Drug use: No     Allergies   Patient has no known allergies.   Review of Systems Review of Systems  Constitutional:  Negative for chills and fever.  Eyes:  Negative for discharge and redness.  Musculoskeletal:  Positive for arthralgias and joint swelling.  Skin:  Negative for color change and wound.  Neurological:  Negative for numbness.     Physical Exam Triage Vital Signs ED Triage Vitals  Encounter Vitals Group  BP 11/04/22 1218 110/74     Systolic BP Percentile --      Diastolic BP Percentile --      Pulse Rate 11/04/22 1218 65     Resp 11/04/22 1218 16     Temp 11/04/22 1218 98.7 F (37.1 C)     Temp Source 11/04/22 1218 Oral     SpO2 11/04/22 1218 97 %     Weight 11/04/22 1217 209 lb (94.8 kg)     Height 11/04/22 1217 5\' 9"  (1.753 m)     Head Circumference --      Peak Flow --      Pain Score 11/04/22 1214 9     Pain Loc --      Pain Education --      Exclude from Growth Chart --    No data found.  Updated Vital Signs BP 110/74 (BP Location: Left Arm)   Pulse 65   Temp 98.7 F (37.1 C) (Oral)   Resp 16   Ht 5\' 9"  (1.753 m)   Wt 209 lb (94.8 kg)   SpO2 97%   BMI 30.86 kg/m      Physical Exam Vitals and nursing note  reviewed.  Constitutional:      General: He is not in acute distress.    Appearance: Normal appearance. He is not ill-appearing.  HENT:     Head: Normocephalic and atraumatic.  Eyes:     Conjunctiva/sclera: Conjunctivae normal.  Cardiovascular:     Rate and Rhythm: Normal rate.  Pulmonary:     Effort: Pulmonary effort is normal. No respiratory distress.  Musculoskeletal:     Comments: Full range of motion of left ankle.  Mild tenderness to palpation diffusely to left lateral and medial ankle as well as left foot diffusely.  Mild swelling appreciated to left medial malleolus.  Skin:    Capillary Refill: Normal cap refill to left toes Neurological:     Mental Status: He is alert.     Comments: Gross sensation intact to distal left toes  Psychiatric:        Mood and Affect: Mood normal.        Behavior: Behavior normal.        Thought Content: Thought content normal.      UC Treatments / Results  Labs (all labs ordered are listed, but only abnormal results are displayed) Labs Reviewed - No data to display  EKG   Radiology DG Foot Complete Left  Result Date: 11/04/2022 CLINICAL DATA:  Injury yesterday. Roller skating. Rolled into sister and fell. Pain and swelling. EXAM: LEFT FOOT - COMPLETE 3+ VIEW; LEFT ANKLE COMPLETE - 3+ VIEW COMPARISON:  Left ankle radiographs 01/14/2022 on left foot radiographs 07/18/2021 FINDINGS: Left ankle: Normal bone mineralization. Joint spaces are preserved. Unchanged moderate dorsal navicular degenerative spurring and adjacent 7 mm well corticated chronic ossicle dorsal to the talonavicular joint. No definite calcaneal coalition is seen. No acute fracture or dislocation. Mild pes planus. Left foot: Minimal lateral great toe metatarsophalangeal degenerative spurring. Joint spaces are preserved. No acute fracture or dislocation. IMPRESSION: 1. No acute fracture or dislocation. 2. Unchanged moderate dorsal navicular degenerative spurring and adjacent 7 mm  well corticated chronic ossicle dorsal to the talonavicular joint. 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 Electronically Signed   By: Neita Garnet M.D.   On: 11/04/2022 13:35   DG Ankle Complete Left  Result Date: 11/04/2022 CLINICAL DATA:  Injury yesterday. Roller skating. Rolled  into sister and fell. Pain and swelling. EXAM: LEFT FOOT - COMPLETE 3+ VIEW; LEFT ANKLE COMPLETE - 3+ VIEW COMPARISON:  Left ankle radiographs 01/14/2022 on left foot radiographs 07/18/2021 FINDINGS: Left ankle: Normal bone mineralization. Joint spaces are preserved. Unchanged moderate dorsal navicular degenerative spurring and adjacent 7 mm well corticated chronic ossicle dorsal to the talonavicular joint. No definite calcaneal coalition is seen. No acute fracture or dislocation. Mild pes planus. Left foot: Minimal lateral great toe metatarsophalangeal degenerative spurring. Joint spaces are preserved. No acute fracture or dislocation. IMPRESSION: 1. No acute fracture or dislocation. 2. Unchanged moderate dorsal navicular degenerative spurring and adjacent 7 mm well corticated chronic ossicle dorsal to the talonavicular joint. 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 Electronically Signed   By: Neita Garnet M.D.   On: 11/04/2022 13:35    Procedures Procedures (including critical care time)  Medications Ordered in UC Medications - No data to display  Initial Impression / Assessment and Plan / UC Course  I have reviewed the triage vital signs and the nursing notes.  Pertinent labs & imaging results that were available during my care of the patient were reviewed by me and considered in my medical decision making (see chart for details).    No fracture noted on x-ray.  Will treat with Ace wrap and recommended RICE therapy.  Encouraged follow-up if no gradual improvement or with any further concerns.  Final Clinical  Impressions(s) / UC Diagnoses   Final diagnoses:  Foot injury, left, initial encounter  Injury of left ankle, initial encounter   Discharge Instructions   None    ED Prescriptions   None    PDMP not reviewed this encounter.   Tomi Bamberger, PA-C 11/04/22 [phone]

## 2022-11-10 ENCOUNTER — Ambulatory Visit (INDEPENDENT_AMBULATORY_CARE_PROVIDER_SITE_OTHER): Payer: BLUE CROSS/BLUE SHIELD | Admitting: Family Medicine

## 2022-11-10 ENCOUNTER — Encounter: Payer: Self-pay | Admitting: Family Medicine

## 2022-11-10 VITALS — BP 110/70 | HR 74 | Ht 69.0 in | Wt 197.0 lb

## 2022-11-10 DIAGNOSIS — S93492A Sprain of other ligament of left ankle, initial encounter: Secondary | ICD-10-CM | POA: Diagnosis not present

## 2022-11-10 HISTORY — DX: Sprain of other ligament of left ankle, initial encounter: S93.492A

## 2022-11-10 NOTE — Assessment & Plan Note (Signed)
1 week since inversion injury.  Patient has had continued limping and pain is not improving.  Previous plain film of left ankle reviewed, no acute fracture visualized.  Most consistent with ankle sprain. Point-of-care ultrasound today suggestive of ATFL sprain.  The patient has not had improvement over past week since injury, plan for immobilization with Cam boot weightbearing as tolerated for 2 weeks.  Will reevaluate at that time, ideally transition to physical therapy and lace up ankle brace at that time.  Counseled on further PRICE management as well. Discussed Tylenol and Ibuprofen as needed for pain.

## 2022-11-10 NOTE — Patient Instructions (Addendum)
It was great to see you! Thank you for allowing me to participate in your care!  Our plans for today:  -I believe you sprained your ankle.  I think it is best to immobilize your ankle for the next 1 to 2 weeks in a boot. -I will see you in 2 weeks to see how you are doing.  Hopefully we can transition you to a different ankle brace at that time. -Please take Tylenol and ibuprofen as needed for pain.  Elevating your foot may help with the swelling and pain as well.   Please arrive 15 minutes PRIOR to your next scheduled appointment time! If you do not, this affects OTHER patients' care.  Take care and seek immediate care sooner if you develop any concerns.   Celine Mans, MD, PGY-2 [facility] 11:23 AM 11/10/2022  [facility]

## 2022-11-10 NOTE — Progress Notes (Signed)
    SUBJECTIVE:   CHIEF COMPLAINT / HPI: Follow-up left ankle injury  Ran into sister yesterday skating per chart review of urgent care visit.  Rolled his ankle.  Was placed in Ace wrap and recommended RICE therapy.  Pain has not improved since being placed in Ace wrap.  Patient is still limping, not in Ace wrap on exam.  States that initial injury his ankle rotated inwards.  Did have some swelling when that started.  PERTINENT  PMH / PSH: Reviewed.  OBJECTIVE:   BP 110/70   Pulse 74   Ht 5\' 9"  (1.753 m)   Wt 197 lb (89.4 kg)   SpO2 97%   BMI 29.09 kg/m   General: NAD, well appearing Neuro: A&O Respiratory: normal WOB on RA Extremities: Moving all 4 extremities equally Ankle/Foot, left: TTP noted at the ATFL. No visible erythema, minimal swelling lateral superior aspect of ankle, ecchymosis, or bony deformity. No notable pes planus/cavus deformity. Transverse arch grossly intact; pain with resisted dorsiflexion and eversion.  Range of motion is full in all directions. Strength is 5/5 in all directions. No tenderness at the insertion/body/myotendinous junction of the Achilles tendon; No peroneal tendon tenderness or subluxation; No tenderness on posterior aspects of lateral and medial malleolus; Stable lateral and medial ligaments; Talar dome nontender; No plantar calcaneal tenderness; No tenderness over the navicular prominence or base of the 5th MT; Able to walk 4 steps.  Provocative Testing:  - Anterior Drawer: Positive  - Talar Tilt: Positive  - Tib/Fib Squeeze Test: NEG  Limited left leg ultrasound: Unable to visualize ATFL, Mild tibiotalar joint effusion  Ankle XR Left 11/04/22 1. No acute fracture or dislocation. 2. Unchanged moderate dorsal navicular degenerative spurring and adjacent 7 mm well corticated chronic ossicle dorsal to the talonavicular joint. 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  ASSESSMENT/PLAN:   Sprain  of anterior talofibular ligament of left ankle, initial encounter Assessment & Plan: 1 week since inversion injury.  Patient has had continued limping and pain is not improving.  Previous plain film of left ankle reviewed, no acute fracture visualized.  Most consistent with ankle sprain. Point-of-care ultrasound today suggestive of ATFL sprain.  The patient has not had improvement over past week since injury, plan for immobilization with Cam boot weightbearing as tolerated for 2 weeks.  Will reevaluate at that time, ideally transition to physical therapy and lace up ankle brace at that time.  Counseled on further PRICE management as well. Discussed Tylenol and Ibuprofen as needed for pain.    Return in about 2 weeks (around 11/24/2022).  Celine Mans, MD [facility]

## 2022-11-28 ENCOUNTER — Encounter: Payer: Self-pay | Admitting: Student

## 2022-11-28 ENCOUNTER — Ambulatory Visit: Payer: BLUE CROSS/BLUE SHIELD | Admitting: Student

## 2022-11-28 VITALS — BP 111/80 | HR 82 | Ht 69.0 in | Wt 202.4 lb

## 2022-11-28 DIAGNOSIS — S93492A Sprain of other ligament of left ankle, initial encounter: Secondary | ICD-10-CM | POA: Diagnosis not present

## 2022-11-28 DIAGNOSIS — Z23 Encounter for immunization: Secondary | ICD-10-CM

## 2022-11-28 NOTE — Progress Notes (Signed)
    SUBJECTIVE:   CHIEF COMPLAINT / HPI: Ankle sprain follow-up  Was seen on 9/6 for sprained on left ankle.  Was placed in CAM boot with weightbearing as tolerated for 2 months. XR on 8/31 showed no acute fracture or dislocation. Today states that pain is improved and that he has been wearing CAM boot since then.  He did not bring his CAM boot today and does not have it on currently.  PERTINENT  PMH / PSH: ADHD, pituitary hypoplasia  OBJECTIVE:   BP 111/80   Pulse 82   Ht 5\' 9"  (1.753 m)   Wt 202 lb 6.4 oz (91.8 kg)   SpO2 100%   BMI 29.89 kg/m   General: Well appearing, NAD, awake, alert, responsive to questions CV: Regular rate and rhythm no murmurs rubs or gallops Respiratory: Chest rises symmetrically,  no increased work of breathing Extremities: Moves upper and lower extremities freely, gait not impaired due to pain, left ankle with mild tenderness around lateral malleolus, pain with dorsiflexion and inversion and eversion of ankle, cap refill brisk, 2+ pulses, sensation intact, no swelling or bruising  ASSESSMENT/PLAN:   Sprain of anterior talofibular ligament of left ankle Given the ankle is improved however still having pain will refer to sports medicine for formal evaluation.  Given patient is not wearing cam boot currently I discussed that he can wear an ankle brace in the meantime.  Continue RICE. -Sports medicine referral placed, number provided to patient to schedule appointment   Levin Erp, MD [facility]

## 2022-11-28 NOTE — Assessment & Plan Note (Signed)
Given the ankle is improved however still having pain will refer to sports medicine for formal evaluation.  Given patient is not wearing cam boot currently I discussed that he can wear an ankle brace in the meantime.  Continue RICE. -Sports medicine referral placed, number provided to patient to schedule appointment

## 2022-11-28 NOTE — Patient Instructions (Addendum)
It was great to see you! Thank you for allowing me to participate in your care!   Our plans for today:  - I recommend wearing an ankle brace in the meantime-I am going to send you to sports medicine for additional workup - Continue to rest, you may weight bear as tolerated, ice the area, compress the area with brace and may take pain medications over the counter to help  Take care and seek immediate care sooner if you develop any concerns.  Levin Erp, MD

## 2022-12-05 ENCOUNTER — Ambulatory Visit: Payer: BLUE CROSS/BLUE SHIELD | Admitting: Sports Medicine

## 2022-12-06 ENCOUNTER — Encounter: Payer: Self-pay | Admitting: Sports Medicine

## 2022-12-06 ENCOUNTER — Ambulatory Visit (INDEPENDENT_AMBULATORY_CARE_PROVIDER_SITE_OTHER): Payer: BLUE CROSS/BLUE SHIELD | Admitting: Sports Medicine

## 2022-12-06 VITALS — BP 108/74 | Ht 69.0 in | Wt 202.0 lb

## 2022-12-06 DIAGNOSIS — M25372 Other instability, left ankle: Secondary | ICD-10-CM

## 2022-12-06 NOTE — Progress Notes (Signed)
Subjective:    Patient ID: Sean Kim, male    DOB: 01-Nov-2000, 22 y.o.   MRN: [id number]  HPI chief complaint: Left ankle pain  Patient is a pleasant 22 year old male that presents today after having injured his left ankle on September 5.  He suffered an inversion injury to the ankle while skating.  He was seen at his primary care office the next day.  He was initially placed into a cam walker with a follow-up visit on September 24.  At that visit he was improving it was recommended that he follow-up with [place] today.  Today he presents in the cam walker but states his pain is much better.  His pain initially was all laterally.  He has had multiple injuries to this ankle in the past.  Last set of x-rays done on August 31 after an unrelated ankle injury showed degenerative spurring at the dorsum of his ankle which is advanced for his young age.  Past medical history reviewed Medications reviewed Allergies reviewed   Review of Systems As above    Objective:   Physical Exam  Well-developed, well-nourished.  No acute distress  Left ankle: Significant pes planus with standing.  Full ankle range of motion.  No soft tissue swelling.  No tenderness to palpation.  Negative anterior drawer, negative talar tilt.  Good pulses.  Ambulating without an antalgic gait.      Assessment & Plan:   Improving left ankle sprain  Patient will discontinue his cam walker in favor of an ASO.  I will refer him to physical therapy for education and a home exercise program for ankle sprain/ankle instability.  May continue to increase activity as tolerated.  Follow-up as needed.

## 2022-12-16 ENCOUNTER — Encounter: Payer: Self-pay | Admitting: Student
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
## 2022-12-25 ENCOUNTER — Other Ambulatory Visit: Payer: Self-pay

## 2022-12-25 ENCOUNTER — Encounter: Payer: Self-pay | Admitting: Physical Therapy

## 2022-12-25 ENCOUNTER — Ambulatory Visit: Payer: BLUE CROSS/BLUE SHIELD | Attending: Sports Medicine | Admitting: Physical Therapy

## 2022-12-25 DIAGNOSIS — M6281 Muscle weakness (generalized): Secondary | ICD-10-CM | POA: Insufficient documentation

## 2022-12-25 DIAGNOSIS — M25372 Other instability, left ankle: Secondary | ICD-10-CM | POA: Insufficient documentation

## 2022-12-25 DIAGNOSIS — M25572 Pain in left ankle and joints of left foot: Secondary | ICD-10-CM | POA: Insufficient documentation

## 2022-12-25 DIAGNOSIS — R262 Difficulty in walking, not elsewhere classified: Secondary | ICD-10-CM | POA: Insufficient documentation

## 2022-12-27 ENCOUNTER — Ambulatory Visit: Payer: BLUE CROSS/BLUE SHIELD | Admitting: Physical Therapy

## 2022-12-27 ENCOUNTER — Encounter: Payer: Self-pay | Admitting: Physical Therapy

## 2022-12-27 DIAGNOSIS — M25572 Pain in left ankle and joints of left foot: Secondary | ICD-10-CM

## 2022-12-27 DIAGNOSIS — R262 Difficulty in walking, not elsewhere classified: Secondary | ICD-10-CM

## 2022-12-27 DIAGNOSIS — M6281 Muscle weakness (generalized): Secondary | ICD-10-CM

## 2022-12-27 NOTE — Therapy (Signed)
OUTPATIENT PHYSICAL THERAPY TREATMENT   Patient Name: Sean Kim MRN: [id number] DOB:2000-05-10, 22 y.o., male Today's Date: 12/27/2022   END OF SESSION:  PT End of Session - 12/27/22 1107     Visit Number 2    Date for PT Re-Evaluation 02/19/23    Authorization Type [employer]    Authorization Time Period auth pending    PT Start Time 1107    PT Stop Time 1145    PT Time Calculation (min) 38 min    Activity Tolerance Patient tolerated treatment well    Behavior During Therapy WFL for tasks assessed/performed;Flat affect              Past Medical History:  Diagnosis Date   ADHD    COVID-19 virus infection    Hypoglycemia    MR (mental retardation)    Pituitary abnormality (HCC)    Seizures (HCC)    Stroke (HCC)    Thyroid disease    Past Surgical History:  Procedure Laterality Date   EYE SURGERY     x2   TONSILLECTOMY     Patient Active Problem List   Diagnosis Date Noted   Sprain of anterior talofibular ligament of left ankle 11/10/2022   Pain of neck with recent traumatic injury 05/31/2022   Retained foreign body of middle ear, bilateral 03/16/2022   Caregiver burden 07/05/2019   History of attempted suicide 07/05/2019   Constipation 11/12/2018   Abnormal EKG 11/12/2018   Encephalopathy due to COVID-19 virus 10/18/2018   Elevated alkaline phosphatase level 05/29/2018   Annual physical exam 10/31/2017   Healthy adult on routine physical examination 10/31/2017   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[facility])    Cognitive deficits    Intermittent left side weakness    Other cerebral infarction [facility])    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   ADHD 07/29/2017   Hypoglycemia 07/23/2017    PCP: Levin Erp, MD   REFERRING PROVIDER: Ralene Cork, DO   REFERRING DIAG: [phone] (ICD-10-CM) - Ankle instability, left   THERAPY DIAG:  Pain in left ankle and joints of left foot  Difficulty in walking, not elsewhere classified  Muscle weakness  (generalized)  RATIONALE FOR EVALUATION AND TREATMENT: Rehabilitation  ONSET DATE: 11/09/22  NEXT MD VISIT: None scheduled   SUBJECTIVE:                                                                                                                                                                                                         SUBJECTIVE STATEMENT: Pt denies pain today.  PAIN: Are you having  pain? Yes: NPRS scale: 0/10 Pain location: L lateral ankle Pain description: sharp Aggravating factors: walking Relieving factors: pain meds - ibuprofen  PERTINENT HISTORY:  Sprain of anterior talofibular ligament of left ankle - 11/09/22, ADHD, cognitive impairment, cerebral infarction, intermittent L side weakness, encephalopathy due to COVID-19, h/o suicide attempt  PRECAUTIONS: None  RED FLAGS: None  WEIGHT BEARING RESTRICTIONS: No - lace-up ankle brace as needed  FALLS:  Has patient fallen in last 6 months? Yes. Number of falls 1  LIVING ENVIRONMENT: Lives with: lives with their family Lives in: House/apartment Stairs: Yes: Internal: 14 steps; on right going up, on left going up, and can reach both Has following equipment at home: None  OCCUPATION: Unemployed  PLOF: Independent, Needs assistance with ADLs, and unable to drive  PATIENT GOALS: "Walk better w/o pain."   OBJECTIVE: (objective measures completed at initial evaluation unless otherwise dated)  DIAGNOSTIC FINDINGS:  11/04/22 - DG L foot and ankle  Left ankle:  Normal bone mineralization. Joint spaces are preserved. Unchanged moderate dorsal navicular degenerative spurring and adjacent 7 mm well corticated chronic ossicle dorsal to the talonavicular joint. No definite calcaneal coalition is seen. No acute fracture or dislocation. Mild pes planus.   Left foot:  Minimal lateral great toe metatarsophalangeal degenerative spurring. Joint spaces are preserved. No acute fracture or dislocation.   IMPRESSION: 1. No  acute fracture or dislocation. 2. Unchanged moderate dorsal navicular degenerative spurring and adjacent 7 mm well corticated chronic ossicle dorsal to the talonavicular joint. 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  PATIENT SURVEYS:  LEFS 23 / 80 = 28.7 %  COGNITION: Overall cognitive status: History of cognitive impairments - at baseline    SENSATION: WFL  EDEMA:  Figure 8: R = 59.5 cm, L = 60.5 cm  MUSCLE LENGTH: Hamstrings:  ITB:  Piriformis:  Hip flexors:  Quads:  Heelcord: Mild tight B  POSTURE:  rounded shoulders, forward head, flexed trunk , and B equinovalgus  PALPATION: TTP over dorsal and lateral L ankle  LOWER EXTREMITY ROM:  Active ROM Right eval Left eval  Ankle dorsiflexion 6 4  Ankle plantarflexion 54 50  Ankle inversion 22 10  Ankle eversion 18 8   Passive ROM Left eval  Ankle dorsiflexion 6  Ankle plantarflexion 52  Ankle inversion 22  Ankle eversion 20  (Blank rows = not tested)  LOWER EXTREMITY MMT:  MMT Right eval Left eval  Hip flexion 5 4+  Hip extension 4+ 4  Hip abduction 4 4  Hip adduction 4+ 4+  Hip internal rotation 5 4  Hip external rotation 5 4  Knee flexion 5 5  Knee extension 5 5  Ankle dorsiflexion 5 4 ^  Ankle plantarflexion 3 3  Ankle inversion 5 4  Ankle eversion 5 4 ^   (Blank rows = not tested, ^ = increased pain)  GAIT: Distance walked: Clinic distances Assistive device utilized: None Level of assistance: Complete Independence Gait pattern: decreased stance time- Left and antalgic Comments: B equinovalgus   TODAY'S TREATMENT:   12/27/22 THERAPEUTIC EXERCISE: to improve flexibility, strength and mobility.  Demonstration, verbal and tactile cues throughout for technique. Rec Bike - L2 x 6 min Long sitting L gastroc stretch with strap 2 x 30" Long sitting L soleus stretch with strap 2 x 30" Long sitting L ankle DF/PF x 20 Long sitting L ankle IV/EV with  strap assist x 20 Ankle circles attempted in sitting and long sitting but unable  to isolate ankle ROM (movement originating from hip) Seated heel-toe raises x 20 Seated B ankle inversion isometric into ball between feet10 x 5" Seated L ankle eversion isometric into ball 10 x 5"  SELF CARE:  Provided instruction in how to secure L ankle ASO - cues to tighten laces before tying and pattern for securing velcro straps   12/25/22 - Eval  THERAPEUTIC EXERCISE: to improve flexibility, strength and mobility.  Demonstration, verbal and tactile cues throughout for technique.  Attempted to instruct patient in basic L ankle ROM exercises however patient having difficulty isolating ankle range of motion from proximal LE (attempts at L ankle inversion/eversion and circles all originating from hip musculature)   PATIENT EDUCATION:  Education details: initial HEP  Person educated: Patient Education method: Explanation, Demonstration, Tactile cues, Verbal cues, and Handouts Education comprehension: verbalized understanding, returned demonstration, verbal cues required, tactile cues required, and needs further education  HOME EXERCISE PROGRAM: Access Code: [email or web address] URL: [email or web address] Date: 12/27/2022 Prepared by: Glenetta Hew  Exercises - Long Sitting Calf Stretch with Strap  - 2 x daily - 7 x weekly - 3 reps - 30 sec hold - Long Sitting Soleus Stretch on Bolster with Strap (Mirrored)  - 2 x daily - 7 x weekly - 3 reps - 30 sec hold - Long Sitting Ankle Pumps (Mirrored)  - 2 x daily - 7 x weekly - 2 sets - 10 reps - 3 sec hold - Peroneal stretch (Mirrored)  - 2 x daily - 7 x weekly - 2 sets - 10 reps - 3-5 sec hold - Seated Heel Toe Raises  - 2 x daily - 7 x weekly - 2 sets - 10 reps - 3 sec hold - Isometric Ankle Inversion  - 2 x daily - 7 x weekly - 2 sets - 10 reps - 3-5 sec hold - Isometric Ankle Eversion at Wall  - 2 x daily - 7 x weekly - 2 sets - 10 reps - 3-5 sec  hold   ASSESSMENT:  CLINICAL IMPRESSION: Provided instruction in initial HEP for L ankle stretching, gentle ROM and light strengthening. Considerable verbal and tactile cues necessary for proper performance of exercises even after PT demonstrating all exercises, hence unable to initiate proximal LE strengthening as planned.  Terrill  will likely require further review of initial HEP to ensure proper home performance.  He also required repeat instruction in proper donning and securing of his L ankle ASO.  Ransome will benefit from continued skilled PT to address identified deficits to improve mobility and activity tolerance with decreased pain interference.    OBJECTIVE IMPAIRMENTS: Abnormal gait, decreased activity tolerance, decreased balance, decreased coordination, decreased knowledge of condition, decreased mobility, difficulty walking, decreased ROM, decreased strength, decreased safety awareness, increased fascial restrictions, impaired perceived functional ability, increased muscle spasms, impaired flexibility, improper body mechanics, postural dysfunction, and pain.   ACTIVITY LIMITATIONS: standing, squatting, stairs, transfers, and locomotion level  PARTICIPATION LIMITATIONS: community activity and school  PERSONAL FACTORS: Education, Past/current experiences, Time since onset of injury/illness/exacerbation, and 3+ comorbidities: Sprain of anterior talofibular ligament of left ankle - 11/09/22, ADHD, cognitive impairment, cerebral infarction, intermittent L side weakness, encephalopathy due to COVID-19, h/o suicide attempt  are also affecting patient's functional outcome.   REHAB POTENTIAL: Good  CLINICAL DECISION MAKING: Evolving/moderate complexity  EVALUATION COMPLEXITY: Moderate   GOALS: Goals reviewed with patient? Yes  SHORT TERM GOALS: Target date: 01/22/2023   Patient will be independent with initial HEP. Baseline: To be established  Goal status: IN PROGRESS  2.  Patient  will report at least 25% improvement in L ankle/foot pain to improve QOL. Baseline: 8/10 Goal status: IN PROGRESS  LONG TERM GOALS: Target date: 02/19/2023   Patient will be independent with advanced/ongoing HEP to improve outcomes and carryover.  Baseline:  Goal status: IN PROGRESS  2.  Patient will report at least 75% improvement in L ankle/foot pain to improve QOL. Baseline: 8/10 Goal status: IN PROGRESS  3.  Patient will demonstrate improved L ankle AROM to [facility] to allow for normal gait and stair mechanics. Baseline: Refer to above LE ROM tables Goal status: IN PROGRESS  4.  Patient will demonstrate improved L LE strength to >/= 4+/5 for improved stability and ease of mobility. Baseline: Refer to above LE MMT table Goal status: IN PROGRESS  5.  Patient will be able to ambulate 600' without AD or ankle ASO brace and normal gait pattern without increased foot/ankle pain to access community.  Baseline: Currently wearing ASO brace for walking but limited by increased pain Goal status: IN PROGRESS  6. Patient will be able to ascend/descend stairs with 1 HR and reciprocal step pattern safely to access home and community.  Baseline: NT Goal status: IN PROGRESS  7.  Patient will report >/= 32/80 on LEFS to demonstrate improved functional ability. Baseline: 23 / 80 = 28.7 % Goal status: IN PROGRESS   PLAN:  PT FREQUENCY: 1-2x/week  PT DURATION: 8 weeks  PLANNED INTERVENTIONS: 97164- PT Re-evaluation, 97110-Therapeutic exercises, 97530- Therapeutic activity, [id number]- Neuromuscular re-education, 97535- Self Care, 16109- Manual therapy, [id number]- Gait training, [phone]- Vasopneumatic device, [id number]- Ultrasound, 09811- Ionotophoresis 4mg /ml Dexamethasone, Balance training, Stair training, Taping, Dry Needling, Joint mobilization, Joint manipulation, Cryotherapy, and Moist heat  PLAN FOR NEXT SESSION: review initial HEP; gently progress L ankle ROM/flexibility and ankle strengthening;  initiate proximal LE strengthening & update HEP accordingly    Marry Guan, PT 12/27/2022, 12:09 PM

## 2023-01-01 ENCOUNTER — Encounter: Payer: Self-pay | Admitting: Physical Therapy

## 2023-01-01 ENCOUNTER — Ambulatory Visit: Payer: MEDICAID | Admitting: Physical Therapy

## 2023-01-01 DIAGNOSIS — R262 Difficulty in walking, not elsewhere classified: Secondary | ICD-10-CM

## 2023-01-01 DIAGNOSIS — M6281 Muscle weakness (generalized): Secondary | ICD-10-CM

## 2023-01-01 DIAGNOSIS — M25572 Pain in left ankle and joints of left foot: Secondary | ICD-10-CM

## 2023-01-01 NOTE — Therapy (Signed)
OUTPATIENT PHYSICAL THERAPY TREATMENT   Patient Name: Iziaha Mahood MRN: [id number] DOB:[birth year], 22 y.o., male Today's Date: 01/01/2023   END OF SESSION:  PT End of Session - 01/01/23 1610     Visit Number 3    Date for PT Re-Evaluation 02/19/23    Authorization Type [employer]    Authorization Time Period 12/25/22 - 02/19/23    Authorization - Visit Number 2    Authorization - Number of Visits 16    PT Start Time 1610    PT Stop Time 1659    PT Time Calculation (min) 49 min    Activity Tolerance Patient tolerated treatment well    Behavior During Therapy WFL for tasks assessed/performed;Flat affect               Past Medical History:  Diagnosis Date   ADHD    COVID-19 virus infection    Hypoglycemia    MR (mental retardation)    Pituitary abnormality (HCC)    Seizures (HCC)    Stroke (HCC)    Thyroid disease    Past Surgical History:  Procedure Laterality Date   EYE SURGERY     x2   TONSILLECTOMY     Patient Active Problem List   Diagnosis Date Noted   Sprain of anterior talofibular ligament of left ankle 11/10/2022   Pain of neck with recent traumatic injury 05/31/2022   Retained foreign body of middle ear, bilateral 03/16/2022   Caregiver burden 07/05/2019   History of attempted suicide 07/05/2019   Constipation 11/12/2018   Abnormal EKG 11/12/2018   Encephalopathy due to COVID-19 virus 10/18/2018   Elevated alkaline phosphatase level 05/29/2018   Annual physical exam 10/31/2017   Healthy adult on routine physical examination 10/31/2017   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[facility])    Cognitive deficits    Intermittent left side weakness    Other cerebral infarction [facility])    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  PAIN: Are you having pain? Yes: NPRS scale: 0/10 Pain location: L lateral ankle Pain description: sharp Aggravating factors: walking Relieving factors: pain meds - ibuprofen  PERTINENT HISTORY:  Sprain of anterior talofibular ligament of left ankle - 11/09/22, ADHD, cognitive impairment, cerebral infarction, intermittent L side weakness, encephalopathy due to COVID-19, h/o suicide attempt  PRECAUTIONS: None  RED FLAGS: None  WEIGHT BEARING RESTRICTIONS: No - lace-up ankle brace as needed  FALLS:  Has patient fallen in last 6 months? Yes. Number of falls 1  LIVING ENVIRONMENT: Lives with: lives with their family Lives in: House/apartment Stairs: Yes: Internal: 14 steps; on right going up, on left going up, and can reach both Has following equipment at home: None  OCCUPATION: Unemployed  PLOF: Independent, Needs assistance with ADLs, and unable to drive  PATIENT GOALS: "Walk better w/o pain."   OBJECTIVE: (objective measures completed at initial evaluation unless otherwise dated)  DIAGNOSTIC FINDINGS:  11/04/22 - DG L foot and ankle  Left ankle:  Normal bone mineralization. Joint spaces are preserved. Unchanged moderate dorsal navicular degenerative spurring and adjacent 7 mm well corticated chronic ossicle dorsal to the talonavicular joint. No definite calcaneal coalition is seen. No acute fracture or dislocation. Mild pes planus.   Left foot:  Minimal lateral great toe metatarsophalangeal  degenerative spurring. Joint spaces are preserved. No acute fracture or dislocation.   IMPRESSION: 1. No acute fracture or dislocation. 2. Unchanged moderate dorsal navicular degenerative spurring and adjacent 7 mm well corticated chronic ossicle dorsal to the talonavicular joint. 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  PATIENT SURVEYS:  LEFS 23 / 80 = 28.7 %  COGNITION: Overall cognitive status: History of cognitive impairments - at baseline    SENSATION: WFL  EDEMA:  Figure 8: R = 59.5 cm, L = 60.5 cm  MUSCLE LENGTH: Hamstrings:  ITB:  Piriformis:  Hip flexors:  Quads:  Heelcord: Mild tight B  POSTURE:  rounded shoulders, forward head, flexed trunk , and B equinovalgus  PALPATION: TTP over dorsal and lateral L ankle  LOWER EXTREMITY ROM:  Active ROM Right eval Left eval  Ankle dorsiflexion 6 4  Ankle plantarflexion 54 50  Ankle inversion 22 10  Ankle eversion 18 8   Passive ROM Left eval  Ankle dorsiflexion 6  Ankle plantarflexion 52  Ankle inversion 22  Ankle eversion 20  (Blank rows = not tested)  LOWER EXTREMITY MMT:  MMT Right eval Left eval  Hip flexion 5 4+  Hip extension 4+ 4  Hip abduction 4 4  Hip adduction 4+ 4+  Hip internal rotation 5 4  Hip external rotation 5 4  Knee flexion 5 5  Knee extension 5 5  Ankle dorsiflexion 5 4 ^  Ankle plantarflexion 3 3  Ankle inversion 5 4  Ankle eversion 5 4 ^   (Blank rows = not tested, ^ = increased pain)  GAIT: Distance walked: Clinic distances Assistive device utilized: None Level of assistance: Complete Independence Gait pattern: decreased stance time- Left and antalgic Comments: B equinovalgus   TODAY'S TREATMENT:   01/01/23 THERAPEUTIC EXERCISE: to improve flexibility, strength and mobility.  Demonstration, verbal and tactile cues throughout for technique. Rec Bike - L3 x 6 min Long sitting L gastroc stretch with strap 2 x 30" Long  sitting L soleus stretch with strap 2 x 30" Long sitting L ankle DF/PF x 20 Long sitting L  ankle IV/EV with strap assist x 20 Seated heel-toe raises x 20 Seated B ankle inversion isometric into ball between feet10 x 5" Seated L ankle eversion isometric into ball 10 x 5" Seated YTB L ankle eversion x 10 Seated YTB L ankle inversion x 10 Hooklying GTB alt unilateral hip ABD/ER x 10 bil Hooklying GTB B hip ABD/ER clam x 10 - pt preferring unilateral  Bridge + GTB hip ABD isometric x 10   12/27/22 THERAPEUTIC EXERCISE: to improve flexibility, strength and mobility.  Demonstration, verbal and tactile cues throughout for technique. Rec Bike - L2 x 6 min Long sitting L gastroc stretch with strap 2 x 30" Long sitting L soleus stretch with strap 2 x 30" Long sitting L ankle DF/PF x 20 Long sitting L ankle IV/EV with strap assist x 20 Ankle circles attempted in sitting and long sitting but unable to isolate ankle ROM (movement originating from hip) Seated heel-toe raises x 20 Seated B ankle inversion isometric into ball between feet10 x 5" Seated L ankle eversion isometric into ball 10 x 5"  SELF CARE:  Provided instruction in how to secure L ankle ASO - cues to tighten laces before tying and pattern for securing velcro straps   12/25/22 - Eval  THERAPEUTIC EXERCISE: to improve flexibility, strength and mobility.  Demonstration, verbal and tactile cues throughout for technique.  Attempted to instruct patient in basic L ankle ROM exercises however patient having difficulty isolating ankle range of motion from proximal LE (attempts at L ankle inversion/eversion and circles all originating from hip musculature)   PATIENT EDUCATION:  Education details: initial HEP  Person educated: Patient Education method: Explanation, Demonstration, Tactile cues, Verbal cues, and Handouts Education comprehension: verbalized understanding, returned demonstration, verbal cues required, tactile cues required,  and needs further education  HOME EXERCISE PROGRAM: Access Code: [email or web address] URL: [email or web address] Date: 01/01/2023 Prepared by: Glenetta Hew  Exercises - Long Sitting Ankle Pumps (Mirrored)  - 2 x daily - 7 x weekly - 2 sets - 10 reps - 3 sec hold - Peroneal stretch (Mirrored)  - 2 x daily - 7 x weekly - 2 sets - 10 reps - 3-5 sec hold - Seated Heel Toe Raises  - 2 x daily - 7 x weekly - 2 sets - 10 reps - 3 sec hold - Isometric Ankle Inversion  - 2 x daily - 7 x weekly - 2 sets - 10 reps - 3-5 sec hold - Isometric Ankle Eversion at Wall  - 2 x daily - 7 x weekly - 2 sets - 10 reps - 3-5 sec hold - Gastroc Stretch on Wall  - 2-3 x daily - 7 x weekly - 3 reps - 30 sec hold - Soleus Stretch on Wall  - 2-3 x daily - 7 x weekly - 3 reps - 30 sec hold - Hooklying Single Leg Bent Knee Fallouts with Resistance  - 1 x daily - 7 x weekly - 2 sets - 10 reps - 3 sec hold - Supine Bridge with Resistance Band  - 1 x daily - 7 x weekly - 2 sets - 10 reps - 5 sec hold   ASSESSMENT:  CLINICAL IMPRESSION: HEP reviewed with moderate cues necessary for proper technique and hold times for stretches, as well as proper movement patterns isolating ankle movement and avoiding compensatory hip movement during ROM exercise and isometrics. Provided instruction in standing alternative for gastroc/soleus stretches, with pt noting preference for standing stretches over use of strap, therefore  provide HEP update reflecting this change. Attempted to progress ankle inversion/eversion strengthening with YTB but pt reporting increased pain with inversion, therefore will continue with isometrics for HEP for now. Initiated proximal LE strengthening wit good tolerance, therefore added these exercises to HEP.  Vong will benefit from continued skilled PT to address identified deficits to improve mobility and activity tolerance with decreased pain interference.    OBJECTIVE IMPAIRMENTS: Abnormal gait, decreased  activity tolerance, decreased balance, decreased coordination, decreased knowledge of condition, decreased mobility, difficulty walking, decreased ROM, decreased strength, decreased safety awareness, increased fascial restrictions, impaired perceived functional ability, increased muscle spasms, impaired flexibility, improper body mechanics, postural dysfunction, and pain.   ACTIVITY LIMITATIONS: standing, squatting, stairs, transfers, and locomotion level  PARTICIPATION LIMITATIONS: community activity and school  PERSONAL FACTORS: Education, Past/current experiences, Time since onset of injury/illness/exacerbation, and 3+ comorbidities: Sprain of anterior talofibular ligament of left ankle - 11/09/22, ADHD, cognitive impairment, cerebral infarction, intermittent L side weakness, encephalopathy due to COVID-19, h/o suicide attempt  are also affecting patient's functional outcome.   REHAB POTENTIAL: Good  CLINICAL DECISION MAKING: Evolving/moderate complexity  EVALUATION COMPLEXITY: Moderate   GOALS: Goals reviewed with patient? Yes  SHORT TERM GOALS: Target date: 01/22/2023   Patient will be independent with initial HEP. Baseline: To be established Goal status: IN PROGRESS  2.  Patient will report at least 25% improvement in L ankle/foot pain to improve QOL. Baseline: 8/10 Goal status: IN PROGRESS  LONG TERM GOALS: Target date: 02/19/2023   Patient will be independent with advanced/ongoing HEP to improve outcomes and carryover.  Baseline:  Goal status: IN PROGRESS  2.  Patient will report at least 75% improvement in L ankle/foot pain to improve QOL. Baseline: 8/10 Goal status: IN PROGRESS  3.  Patient will demonstrate improved L ankle AROM to [facility] to allow for normal gait and stair mechanics. Baseline: Refer to above LE ROM tables Goal status: IN PROGRESS  4.  Patient will demonstrate improved L LE strength to >/= 4+/5 for improved stability and ease of mobility. Baseline:  Refer to above LE MMT table Goal status: IN PROGRESS  5.  Patient will be able to ambulate 600' without AD or ankle ASO brace and normal gait pattern without increased foot/ankle pain to access community.  Baseline: Currently wearing ASO brace for walking but limited by increased pain Goal status: IN PROGRESS  6. Patient will be able to ascend/descend stairs with 1 HR and reciprocal step pattern safely to access home and community.  Baseline: NT Goal status: IN PROGRESS  7.  Patient will report >/= 32/80 on LEFS to demonstrate improved functional ability. Baseline: 23 / 80 = 28.7 % Goal status: IN PROGRESS   PLAN:  PT FREQUENCY: 1-2x/week  PT DURATION: 8 weeks  PLANNED INTERVENTIONS: 97164- PT Re-evaluation, 97110-Therapeutic exercises, 97530- Therapeutic activity, [id number]- Neuromuscular re-education, 97535- Self Care, [id number]- Manual therapy, [id number]- Gait training, 97016- Vasopneumatic device, [id number]- Ultrasound, 25366- Ionotophoresis 4mg /ml Dexamethasone, Balance training, Stair training, Taping, Dry Needling, Joint mobilization, Joint manipulation, Cryotherapy, and Moist heat  PLAN FOR NEXT SESSION: review initial HEP; gently progress L ankle ROM/flexibility and ankle strengthening; progress proximal LE strengthening & update HEP accordingly    Marry Guan, PT 01/01/2023, 5:22 PM

## 2023-01-15 ENCOUNTER — Ambulatory Visit: Payer: BLUE CROSS/BLUE SHIELD | Attending: Sports Medicine

## 2023-01-15 DIAGNOSIS — M25572 Pain in left ankle and joints of left foot: Secondary | ICD-10-CM

## 2023-01-15 DIAGNOSIS — R262 Difficulty in walking, not elsewhere classified: Secondary | ICD-10-CM

## 2023-01-15 DIAGNOSIS — M6281 Muscle weakness (generalized): Secondary | ICD-10-CM

## 2023-01-15 NOTE — Therapy (Signed)
OUTPATIENT PHYSICAL THERAPY TREATMENT   Patient Name: Sean Kim MRN: [id number] DOB:[birth year], 22 y.o., male Today's Date: 01/15/2023   END OF SESSION:  PT End of Session - 01/15/23 1540     Visit Number 4    Date for PT Re-Evaluation 02/19/23    Authorization Type [employer]    Authorization Time Period 12/25/22 - 02/19/23    Authorization - Visit Number 3    Authorization - Number of Visits 16    PT Start Time 1532    PT Stop Time 1617    PT Time Calculation (min) 45 min    Activity Tolerance Patient tolerated treatment well    Behavior During Therapy WFL for tasks assessed/performed;Flat affect                Past Medical History:  Diagnosis Date   ADHD    COVID-19 virus infection    Hypoglycemia    MR (mental retardation)    Pituitary abnormality (HCC)    Seizures (HCC)    Stroke (HCC)    Thyroid disease    Past Surgical History:  Procedure Laterality Date   EYE SURGERY     x2   TONSILLECTOMY     Patient Active Problem List   Diagnosis Date Noted   Sprain of anterior talofibular ligament of left ankle 11/10/2022   Pain of neck with recent traumatic injury 05/31/2022   Retained foreign body of middle ear, bilateral 03/16/2022   Caregiver burden 07/05/2019   History of attempted suicide 07/05/2019   Constipation 11/12/2018   Abnormal EKG 11/12/2018   Encephalopathy due to COVID-19 virus 10/18/2018   Elevated alkaline phosphatase level 05/29/2018   Annual physical exam 10/31/2017   Healthy adult on routine physical examination 10/31/2017   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[facility])    Cognitive deficits    Intermittent left side weakness    Other cerebral infarction [facility])    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 Yes: NPRS scale: 0/10 Pain location: L lateral ankle Pain description: sharp Aggravating factors: walking Relieving factors: pain meds - ibuprofen  PERTINENT HISTORY:  Sprain of anterior talofibular ligament of left ankle - 11/09/22, ADHD, cognitive impairment, cerebral infarction, intermittent L side weakness, encephalopathy due to COVID-19, h/o suicide attempt  PRECAUTIONS: None  RED FLAGS: None  WEIGHT BEARING RESTRICTIONS: No - lace-up ankle brace as needed  FALLS:  Has patient fallen in last 6 months? Yes. Number of falls 1  LIVING ENVIRONMENT: Lives with: lives with their family Lives in: House/apartment Stairs: Yes: Internal: 14 steps; on right going up, on left going up, and can reach both Has following equipment at home: None  OCCUPATION: Unemployed  PLOF: Independent, Needs assistance with ADLs, and unable to drive  PATIENT GOALS: "Walk better w/o pain."   OBJECTIVE: (objective measures completed at initial evaluation unless otherwise dated)  DIAGNOSTIC FINDINGS:  11/04/22 - DG L foot and ankle  Left ankle:  Normal bone mineralization. Joint spaces are preserved. Unchanged moderate dorsal navicular degenerative spurring and adjacent 7 mm well corticated chronic ossicle dorsal to the talonavicular joint. No definite calcaneal coalition is seen. No acute fracture or dislocation. Mild pes planus.   Left foot:  Minimal lateral great toe metatarsophalangeal  degenerative spurring. Joint spaces are preserved. No acute fracture or dislocation.   IMPRESSION: 1. No acute fracture or dislocation. 2. Unchanged moderate dorsal navicular degenerative spurring and adjacent 7 mm well corticated chronic ossicle dorsal to the talonavicular joint. 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  PATIENT SURVEYS:  LEFS 23 / 80 = 28.7 %  COGNITION: Overall cognitive status: History of cognitive impairments - at baseline    SENSATION: WFL  EDEMA:  Figure 8: R = 59.5 cm, L = 60.5 cm  MUSCLE LENGTH: Hamstrings:  ITB:  Piriformis:  Hip flexors:  Quads:  Heelcord: Mild tight B  POSTURE:  rounded shoulders, forward head, flexed trunk , and B equinovalgus  PALPATION: TTP over dorsal and lateral L ankle  LOWER EXTREMITY ROM:  Active ROM Right eval Left eval  Ankle dorsiflexion 6 4  Ankle plantarflexion 54 50  Ankle inversion 22 10  Ankle eversion 18 8   Passive ROM Left eval  Ankle dorsiflexion 6  Ankle plantarflexion 52  Ankle inversion 22  Ankle eversion 20  (Blank rows = not tested)  LOWER EXTREMITY MMT:  MMT Right eval Left eval  Hip flexion 5 4+  Hip extension 4+ 4  Hip abduction 4 4  Hip adduction 4+ 4+  Hip internal rotation 5 4  Hip external rotation 5 4  Knee flexion 5 5  Knee extension 5 5  Ankle dorsiflexion 5 4 ^  Ankle plantarflexion 3 3  Ankle inversion 5 4  Ankle eversion 5 4 ^   (Blank rows = not tested, ^ = increased pain)  GAIT: Distance walked: Clinic distances Assistive device utilized: None Level of assistance: Complete Independence Gait pattern: decreased stance time- Left and antalgic Comments: B equinovalgus   TODAY'S TREATMENT:  01/15/23 THERAPEUTIC EXERCISE: to improve flexibility, strength and mobility.  Demonstration, verbal and tactile cues throughout for technique. Nustep L5x12min UE/LE L ankle EV,IV,PF RTB x 10 each way- pain with IV Isometric ankle  IV with ball 2x10 Bridges with GTB 2x10  Hooklying clams unilateral GTB 10x5" S/L hip abduction x 10 bil   01/01/23 THERAPEUTIC  EXERCISE: to improve flexibility, strength and mobility.  Demonstration, verbal and tactile cues throughout for technique. Rec Bike - L3 x 6 min Long sitting L gastroc stretch with strap 2 x 30" Long sitting L soleus stretch with strap 2 x 30" Long sitting L ankle DF/PF x 20 Long sitting L ankle IV/EV with strap assist x 20 Seated heel-toe raises x 20 Seated B ankle inversion isometric into ball between feet10 x 5" Seated L ankle eversion isometric into ball 10 x 5" Seated YTB L ankle eversion x 10 Seated YTB L ankle inversion x 10 Hooklying GTB alt unilateral hip ABD/ER x 10 bil Hooklying GTB B hip ABD/ER clam x 10 - pt preferring unilateral  Bridge + GTB hip ABD isometric x 10   12/27/22 THERAPEUTIC EXERCISE: to improve flexibility, strength and mobility.  Demonstration, verbal and tactile cues throughout for technique. Rec Bike - L2 x 6 min Long sitting L gastroc stretch with strap 2 x 30" Long sitting L soleus stretch with strap 2 x 30" Long sitting L ankle DF/PF x 20 Long sitting L ankle IV/EV with strap assist x 20 Ankle circles attempted in sitting and long sitting but unable to isolate ankle ROM (movement originating from hip) Seated heel-toe raises x 20 Seated B ankle inversion isometric into ball between feet10 x 5" Seated L ankle eversion isometric into ball 10 x 5"  SELF CARE:  Provided instruction in how to secure L ankle ASO - cues to tighten laces before tying and pattern for securing velcro straps   12/25/22 - Eval  THERAPEUTIC EXERCISE: to improve flexibility, strength and mobility.  Demonstration, verbal and tactile cues throughout for technique.  Attempted to instruct patient in basic L ankle ROM exercises however patient having difficulty isolating ankle range of motion from proximal LE (attempts at L ankle inversion/eversion and  circles all originating from hip musculature)   PATIENT EDUCATION:  Education details: added resisted EV and PF with RTB to HEP - no IV d/t pain Person educated: Patient Education method: Explanation, Demonstration, Tactile cues, Verbal cues, and Handouts Education comprehension: verbalized understanding, returned demonstration, verbal cues required, tactile cues required, and needs further education  HOME EXERCISE PROGRAM: Access Code: [email or web address] URL: [email or web address] Date: 01/01/2023 Prepared by: Glenetta Hew  Exercises - Long Sitting Ankle Pumps (Mirrored)  - 2 x daily - 7 x weekly - 2 sets - 10 reps - 3 sec hold - Peroneal stretch (Mirrored)  - 2 x daily - 7 x weekly - 2 sets - 10 reps - 3-5 sec hold - Seated Heel Toe Raises  - 2 x daily - 7 x weekly - 2 sets - 10 reps - 3 sec hold - Isometric Ankle Inversion  - 2 x daily - 7 x weekly - 2 sets - 10 reps - 3-5 sec hold - Isometric Ankle Eversion at Wall  - 2 x daily - 7 x weekly - 2 sets - 10 reps - 3-5 sec hold - Gastroc Stretch on Wall  - 2-3 x daily - 7 x weekly - 3 reps - 30 sec hold - Soleus Stretch on Wall  - 2-3 x daily - 7 x weekly - 3 reps - 30 sec hold - Hooklying Single Leg Bent Knee Fallouts with Resistance  - 1 x daily - 7 x weekly - 2 sets - 10 reps - 3 sec hold - Supine Bridge with Resistance Band  - 1 x daily - 7 x weekly - 2 sets - 10  reps - 5 sec hold   ASSESSMENT:  CLINICAL IMPRESSION: Pt continues to show pain with resisted isotonic ankle IV, isometric does not cause pain. Focused skilled interventions on improving L ankle strengthening as well as proximal LE strengthening. He demonstrates continued proximal LE weakness esp on L side.  Demetries will benefit from continued skilled PT to address identified deficits to improve mobility and activity tolerance with decreased pain interference.    OBJECTIVE IMPAIRMENTS: Abnormal gait, decreased activity tolerance, decreased balance, decreased  coordination, decreased knowledge of condition, decreased mobility, difficulty walking, decreased ROM, decreased strength, decreased safety awareness, increased fascial restrictions, impaired perceived functional ability, increased muscle spasms, impaired flexibility, improper body mechanics, postural dysfunction, and pain.   ACTIVITY LIMITATIONS: standing, squatting, stairs, transfers, and locomotion level  PARTICIPATION LIMITATIONS: community activity and school  PERSONAL FACTORS: Education, Past/current experiences, Time since onset of injury/illness/exacerbation, and 3+ comorbidities: Sprain of anterior talofibular ligament of left ankle - 11/09/22, ADHD, cognitive impairment, cerebral infarction, intermittent L side weakness, encephalopathy due to COVID-19, h/o suicide attempt  are also affecting patient's functional outcome.   REHAB POTENTIAL: Good  CLINICAL DECISION MAKING: Evolving/moderate complexity  EVALUATION COMPLEXITY: Moderate   GOALS: Goals reviewed with patient? Yes  SHORT TERM GOALS: Target date: 01/22/2023   Patient will be independent with initial HEP. Baseline: To be established Goal status: MET- pt denies concerns  2.  Patient will report at least 25% improvement in L ankle/foot pain to improve QOL. Baseline: 8/10 Goal status: IN PROGRESS  LONG TERM GOALS: Target date: 02/19/2023   Patient will be independent with advanced/ongoing HEP to improve outcomes and carryover.  Baseline:  Goal status: IN PROGRESS  2.  Patient will report at least 75% improvement in L ankle/foot pain to improve QOL. Baseline: 8/10 Goal status: IN PROGRESS  3.  Patient will demonstrate improved L ankle AROM to [facility] to allow for normal gait and stair mechanics. Baseline: Refer to above LE ROM tables Goal status: IN PROGRESS  4.  Patient will demonstrate improved L LE strength to >/= 4+/5 for improved stability and ease of mobility. Baseline: Refer to above LE MMT table Goal status:  IN PROGRESS  5.  Patient will be able to ambulate 600' without AD or ankle ASO brace and normal gait pattern without increased foot/ankle pain to access community.  Baseline: Currently wearing ASO brace for walking but limited by increased pain Goal status: IN PROGRESS  6. Patient will be able to ascend/descend stairs with 1 HR and reciprocal step pattern safely to access home and community.  Baseline: NT Goal status: IN PROGRESS  7.  Patient will report >/= 32/80 on LEFS to demonstrate improved functional ability. Baseline: 23 / 80 = 28.7 % Goal status: IN PROGRESS   PLAN:  PT FREQUENCY: 1-2x/week  PT DURATION: 8 weeks  PLANNED INTERVENTIONS: 97164- PT Re-evaluation, 97110-Therapeutic exercises, 97530- Therapeutic activity, [id number]- Neuromuscular re-education, 97535- Self Care, 16109- Manual therapy, [id number]- Gait training, 97016- Vasopneumatic device, [id number]- Ultrasound, 60454- Ionotophoresis 4mg /ml Dexamethasone, Balance training, Stair training, Taping, Dry Needling, Joint mobilization, Joint manipulation, Cryotherapy, and Moist heat  PLAN FOR NEXT SESSION: review initial HEP; gently progress L ankle ROM/flexibility and ankle strengthening; progress proximal LE strengthening & update HEP accordingly    Darleene Cleaver, PTA 01/15/2023, 4:37 PM

## 2023-01-16 NOTE — Therapy (Signed)
OUTPATIENT PHYSICAL THERAPY TREATMENT   Patient Name: Sean Kim MRN: [id number] DOB:25-Apr-2000, 22 y.o., male Today's Date: 01/17/2023   END OF SESSION:  PT End of Session - 01/17/23 1505     Visit Number 5    Date for PT Re-Evaluation 02/19/23    Authorization Type [employer]    Authorization Time Period 12/25/22 - 02/19/23    Authorization - Visit Number 4    Authorization - Number of Visits 16    PT Start Time 1505    PT Stop Time 1545    PT Time Calculation (min) 40 min    Activity Tolerance Patient tolerated treatment well    Behavior During Therapy WFL for tasks assessed/performed;Flat affect                 Past Medical History:  Diagnosis Date   ADHD    COVID-19 virus infection    Hypoglycemia    MR (mental retardation)    Pituitary abnormality (HCC)    Seizures (HCC)    Stroke (HCC)    Thyroid disease    Past Surgical History:  Procedure Laterality Date   EYE SURGERY     x2   TONSILLECTOMY     Patient Active Problem List   Diagnosis Date Noted   Sprain of anterior talofibular ligament of left ankle 11/10/2022   Pain of neck with recent traumatic injury 05/31/2022   Retained foreign body of middle ear, bilateral 03/16/2022   Caregiver burden 07/05/2019   History of attempted suicide 07/05/2019   Constipation 11/12/2018   Abnormal EKG 11/12/2018   Encephalopathy due to COVID-19 virus 10/18/2018   Elevated alkaline phosphatase level 05/29/2018   Annual physical exam 10/31/2017   Healthy adult on routine physical examination 10/31/2017   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[facility])    Cognitive deficits    Intermittent left side weakness    Other cerebral infarction [facility])    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 It is better when he wears his brace.   PAIN: Are you having pain? Yes: NPRS scale: 0/10 Pain location: L lateral ankle Pain description: sharp Aggravating factors: walking Relieving factors: pain meds - ibuprofen  PERTINENT HISTORY:  Sprain of anterior talofibular ligament of left ankle - 11/09/22, ADHD, cognitive impairment, cerebral infarction, intermittent L side weakness, encephalopathy due to COVID-19, h/o suicide attempt  PRECAUTIONS: None  RED FLAGS: None  WEIGHT BEARING RESTRICTIONS: No - lace-up ankle brace as needed  FALLS:  Has patient fallen in last 6 months? Yes. Number of falls 1  LIVING ENVIRONMENT: Lives with: lives with their family Lives in: House/apartment Stairs: Yes: Internal: 14 steps; on right going up, on left going up, and can reach both Has following equipment at home: None  OCCUPATION: Unemployed  PLOF: Independent, Needs assistance with ADLs, and unable to drive  PATIENT GOALS: "Walk better w/o pain."   OBJECTIVE: (objective measures completed at initial evaluation unless otherwise dated)  DIAGNOSTIC FINDINGS:  11/04/22 - DG L foot and ankle  Left ankle:  Normal bone mineralization. Joint spaces are preserved. Unchanged moderate dorsal navicular degenerative spurring and adjacent 7 mm well corticated chronic ossicle dorsal to the talonavicular joint. No definite calcaneal coalition is seen. No acute fracture or dislocation. Mild  pes planus.   Left foot:  Minimal lateral great toe metatarsophalangeal degenerative spurring. Joint spaces are preserved. No acute fracture or dislocation.   IMPRESSION: 1. No acute fracture or dislocation. 2. Unchanged moderate dorsal navicular degenerative spurring and adjacent 7 mm well corticated chronic ossicle dorsal to the talonavicular joint. 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  PATIENT SURVEYS:  LEFS 23 / 80 = 28.7 %  COGNITION: Overall cognitive status: History of cognitive impairments - at baseline    SENSATION: WFL  EDEMA:  Figure 8: R = 59.5 cm, L = 60.5 cm  MUSCLE LENGTH: Hamstrings:  ITB:  Piriformis:  Hip flexors:  Quads:  Heelcord: Mild tight B  POSTURE:  rounded shoulders, forward head, flexed trunk , and B equinovalgus  PALPATION: TTP over dorsal and lateral L ankle  LOWER EXTREMITY ROM:  Active ROM Right eval Left eval  Ankle dorsiflexion 6 4  Ankle plantarflexion 54 50  Ankle inversion 22 10  Ankle eversion 18 8   Passive ROM Left eval  Ankle dorsiflexion 6  Ankle plantarflexion 52  Ankle inversion 22  Ankle eversion 20  (Blank rows = not tested)  LOWER EXTREMITY MMT:  MMT Right eval Left eval  Hip flexion 5 4+  Hip extension 4+ 4  Hip abduction 4 4  Hip adduction 4+ 4+  Hip internal rotation 5 4  Hip external rotation 5 4  Knee flexion 5 5  Knee extension 5 5  Ankle dorsiflexion 5 4 ^  Ankle plantarflexion 3 3  Ankle inversion 5 4  Ankle eversion 5 4 ^   (Blank rows = not tested, ^ = increased pain)  GAIT: Distance walked: Clinic distances Assistive device utilized: None Level of assistance: Complete Independence Gait pattern: decreased stance time- Left and antalgic Comments: B equinovalgus   TODAY'S TREATMENT:  01/17/23 .THERAPEUTIC EXERCISE: to improve flexibility, strength and mobility.  Demonstration, verbal and tactile cues throughout for technique. Nustep  L5x88min UE/LE Active ROM all planes, circles CW/CCW  and PNF  x 10 ea L ankle EV,IV,PF YTB and RTB x 10  each Bridges with GTB 2x10 5 sec hold Hooklying clams unilateral GTB 10x5" S/L hip abduction x 10 bil GTB B with tactile cues to stay in sidelying and not roll back S/L clam GTB x 10 B Standing heel raises x 12 at counter - no pain reported    01/15/23 THERAPEUTIC EXERCISE: to improve flexibility, strength and mobility.  Demonstration, verbal and tactile cues throughout for technique. Nustep L5x16min UE/LE L ankle EV,IV,PF RTB x 10 each way- pain with IV Isometric ankle IV with ball 2x10 Bridges with GTB 2x10  Hooklying clams unilateral GTB 10x5" S/L hip abduction x 10 bil   01/01/23 THERAPEUTIC EXERCISE: to improve flexibility, strength and mobility.  Demonstration, verbal and tactile cues throughout for technique. Rec Bike - L3 x 6 min Long sitting L gastroc stretch with strap 2 x 30" Long sitting L soleus stretch with strap 2 x 30" Long sitting L ankle DF/PF x 20 Long sitting L ankle IV/EV with strap assist x 20 Seated heel-toe raises x 20 Seated B ankle inversion isometric into ball between feet10 x 5" Seated L ankle eversion isometric into ball 10 x 5" Seated YTB L ankle eversion x 10 Seated YTB L ankle inversion x 10 Hooklying GTB alt unilateral hip ABD/ER x 10 bil Hooklying GTB B hip ABD/ER clam x 10 - pt preferring unilateral  Bridge + GTB hip ABD isometric x 10   12/27/22 THERAPEUTIC EXERCISE: to improve flexibility, strength and mobility.  Demonstration, verbal and tactile cues throughout for technique. Rec Bike - L2 x 6 min Long sitting L gastroc stretch with strap 2 x 30" Long sitting L soleus stretch with strap 2 x 30" Long sitting L ankle DF/PF x 20 Long sitting L ankle IV/EV with strap assist x 20 Ankle circles attempted in sitting and long sitting but unable to isolate ankle ROM (movement originating from hip) Seated heel-toe raises x 20 Seated B ankle  inversion isometric into ball between feet10 x 5" Seated L ankle eversion isometric into ball 10 x 5"  SELF CARE:  Provided instruction in how to secure L ankle ASO - cues to tighten laces before tying and pattern for securing velcro straps   12/25/22 - Eval  THERAPEUTIC EXERCISE: to improve flexibility, strength and mobility.  Demonstration, verbal and tactile cues throughout for technique.  Attempted to instruct patient in basic L ankle ROM exercises however patient having difficulty isolating ankle range of motion from proximal LE (attempts at L ankle inversion/eversion and circles all originating from hip musculature)   PATIENT EDUCATION:  Education details: added resisted EV and PF with RTB to HEP - no IV d/t pain Person educated: Patient Education method: Explanation, Demonstration, Tactile cues, Verbal cues, and Handouts Education comprehension: verbalized understanding, returned demonstration, verbal cues required, tactile cues required, and needs further education  HOME EXERCISE PROGRAM: Access Code: [email or web address] URL: [email or web address] Date: 01/01/2023 Prepared by: Glenetta Hew  Exercises - Long Sitting Ankle Pumps (Mirrored)  - 2 x daily - 7 x weekly - 2 sets - 10 reps - 3 sec hold - Peroneal stretch (Mirrored)  - 2 x daily - 7 x weekly - 2 sets - 10 reps - 3-5 sec hold - Seated Heel Toe Raises  - 2 x daily - 7 x weekly - 2 sets - 10 reps - 3 sec hold - Isometric Ankle Inversion  - 2 x daily - 7 x weekly - 2 sets - 10 reps - 3-5 sec hold - Isometric Ankle Eversion at  Wall  - 2 x daily - 7 x weekly - 2 sets - 10 reps - 3-5 sec hold - Gastroc Stretch on Wall  - 2-3 x daily - 7 x weekly - 3 reps - 30 sec hold - Soleus Stretch on Wall  - 2-3 x daily - 7 x weekly - 3 reps - 30 sec hold - Hooklying Single Leg Bent Knee Fallouts with Resistance  - 1 x daily - 7 x weekly - 2 sets - 10 reps - 3 sec hold - Supine Bridge with Resistance Band  - 1 x daily - 7 x weekly - 2  sets - 10 reps - 5 sec hold   ASSESSMENT:  CLINICAL IMPRESSION: Patient reports 25% improvement in pain overall meeting his STG #2. He tolerated all exercises today without pain including standing heel raises at the end of the session. He stated that it would hurt if he didn't have his brace and shoe on. He required tactile cues with resisted hip ABD for correct form and to feel it in his gluteals vs. Quads. No difficulty with red Tband today with ankle exercises and may be able to progress resistance next visit.    OBJECTIVE IMPAIRMENTS: Abnormal gait, decreased activity tolerance, decreased balance, decreased coordination, decreased knowledge of condition, decreased mobility, difficulty walking, decreased ROM, decreased strength, decreased safety awareness, increased fascial restrictions, impaired perceived functional ability, increased muscle spasms, impaired flexibility, improper body mechanics, postural dysfunction, and pain.   ACTIVITY LIMITATIONS: standing, squatting, stairs, transfers, and locomotion level  PARTICIPATION LIMITATIONS: community activity and school  PERSONAL FACTORS: Education, Past/current experiences, Time since onset of injury/illness/exacerbation, and 3+ comorbidities: Sprain of anterior talofibular ligament of left ankle - 11/09/22, ADHD, cognitive impairment, cerebral infarction, intermittent L side weakness, encephalopathy due to COVID-19, h/o suicide attempt  are also affecting patient's functional outcome.   REHAB POTENTIAL: Good  CLINICAL DECISION MAKING: Evolving/moderate complexity  EVALUATION COMPLEXITY: Moderate   GOALS: Goals reviewed with patient? Yes  SHORT TERM GOALS: Target date: 01/22/2023   Patient will be independent with initial HEP. Baseline: To be established Goal status: MET- pt denies concerns  2.  Patient will report at least 25% improvement in L ankle/foot pain to improve QOL. Baseline: 8/10 Goal status: MET Patient reports pain at  6/10 vs. 9/10 when he started  LONG TERM GOALS: Target date: 02/19/2023   Patient will be independent with advanced/ongoing HEP to improve outcomes and carryover.  Baseline:  Goal status: IN PROGRESS  2.  Patient will report at least 75% improvement in L ankle/foot pain to improve QOL. Baseline: 8/10 Goal status: IN PROGRESS  3.  Patient will demonstrate improved L ankle AROM to [facility] to allow for normal gait and stair mechanics. Baseline: Refer to above LE ROM tables Goal status: IN PROGRESS  4.  Patient will demonstrate improved L LE strength to >/= 4+/5 for improved stability and ease of mobility. Baseline: Refer to above LE MMT table Goal status: IN PROGRESS  5.  Patient will be able to ambulate 600' without AD or ankle ASO brace and normal gait pattern without increased foot/ankle pain to access community.  Baseline: Currently wearing ASO brace for walking but limited by increased pain Goal status: IN PROGRESS  6. Patient will be able to ascend/descend stairs with 1 HR and reciprocal step pattern safely to access home and community.  Baseline: NT Goal status: IN PROGRESS  7.  Patient will report >/= 32/80 on LEFS to demonstrate improved functional ability. Baseline:  23 / 80 = 28.7 % Goal status: IN PROGRESS   PLAN:  PT FREQUENCY: 1-2x/week  PT DURATION: 8 weeks  PLANNED INTERVENTIONS: 97164- PT Re-evaluation, 97110-Therapeutic exercises, 97530- Therapeutic activity, [id number]- Neuromuscular re-education, 97535- Self Care, 97140- Manual therapy, [id number]- Gait training, 97016- Vasopneumatic device, [id number]- Ultrasound, 34742- Ionotophoresis 4mg /ml Dexamethasone, Balance training, Stair training, Taping, Dry Needling, Joint mobilization, Joint manipulation, Cryotherapy, and Moist heat  PLAN FOR NEXT SESSION: review initial HEP; gently progress L ankle ROM/flexibility and ankle strengthening; progress proximal LE strengthening & update HEP accordingly   Solon Palm, PT   01/17/2023, 3:52 PM

## 2023-01-17 ENCOUNTER — Encounter: Payer: Self-pay | Admitting: Physical Therapy

## 2023-01-17 ENCOUNTER — Ambulatory Visit: Payer: BLUE CROSS/BLUE SHIELD | Admitting: Physical Therapy

## 2023-01-17 DIAGNOSIS — M6281 Muscle weakness (generalized): Secondary | ICD-10-CM

## 2023-01-17 DIAGNOSIS — M25572 Pain in left ankle and joints of left foot: Secondary | ICD-10-CM

## 2023-01-17 DIAGNOSIS — R262 Difficulty in walking, not elsewhere classified: Secondary | ICD-10-CM

## 2023-01-22 ENCOUNTER — Ambulatory Visit: Payer: BLUE CROSS/BLUE SHIELD | Admitting: Physical Therapy

## 2023-01-22 ENCOUNTER — Encounter: Payer: Self-pay | Admitting: Physical Therapy

## 2023-01-22 DIAGNOSIS — M6281 Muscle weakness (generalized): Secondary | ICD-10-CM

## 2023-01-22 DIAGNOSIS — M25572 Pain in left ankle and joints of left foot: Secondary | ICD-10-CM | POA: Diagnosis not present

## 2023-01-22 DIAGNOSIS — R262 Difficulty in walking, not elsewhere classified: Secondary | ICD-10-CM

## 2023-01-22 NOTE — Therapy (Signed)
OUTPATIENT PHYSICAL THERAPY TREATMENT   Patient Name: Sean Kim MRN: [id number] DOB:[birth year], 22 y.o., male Today's Date: 01/22/2023   END OF SESSION:  PT End of Session - 01/22/23 1615     Visit Number 6    Date for PT Re-Evaluation 02/19/23    Authorization Type [employer]    Authorization Time Period 12/25/22 - 02/19/23    Authorization - Visit Number 5    Authorization - Number of Visits 16    PT Start Time 1615    PT Stop Time 1700    PT Time Calculation (min) 45 min    Activity Tolerance Patient tolerated treatment well    Behavior During Therapy WFL for tasks assessed/performed;Flat affect                  Past Medical History:  Diagnosis Date   ADHD    COVID-19 virus infection    Hypoglycemia    MR (mental retardation)    Pituitary abnormality (HCC)    Seizures (HCC)    Stroke (HCC)    Thyroid disease    Past Surgical History:  Procedure Laterality Date   EYE SURGERY     x2   TONSILLECTOMY     Patient Active Problem List   Diagnosis Date Noted   Sprain of anterior talofibular ligament of left ankle 11/10/2022   Pain of neck with recent traumatic injury 05/31/2022   Retained foreign body of middle ear, bilateral 03/16/2022   Caregiver burden 07/05/2019   History of attempted suicide 07/05/2019   Constipation 11/12/2018   Abnormal EKG 11/12/2018   Encephalopathy due to COVID-19 virus 10/18/2018   Elevated alkaline phosphatase level 05/29/2018   Annual physical exam 10/31/2017   Healthy adult on routine physical examination 10/31/2017   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[facility])    Cognitive deficits    Intermittent left side weakness    Other cerebral infarction [facility])    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   PAIN: Are you having pain? Yes: NPRS scale: 0/10 at rest, with walking up to 5/10 Pain location: L lateral ankle Pain description: sharp Aggravating factors: walking Relieving factors: pain meds - ibuprofen  PERTINENT HISTORY:  Sprain of anterior talofibular ligament of left ankle - 11/09/22, ADHD, cognitive impairment, cerebral infarction, intermittent L side weakness, encephalopathy due to COVID-19, h/o suicide attempt  PRECAUTIONS: None  RED FLAGS: None  WEIGHT BEARING RESTRICTIONS: No - lace-up ankle brace as needed  FALLS:  Has patient fallen in last 6 months? Yes. Number of falls 1  LIVING ENVIRONMENT: Lives with: lives with their family Lives in: House/apartment Stairs: Yes: Internal: 14 steps; on right going up, on left going up, and can reach both Has following equipment at home: None  OCCUPATION: Unemployed  PLOF: Independent, Needs assistance with ADLs, and unable to drive  PATIENT GOALS: "Walk better w/o pain."   OBJECTIVE: (objective measures completed at initial evaluation unless otherwise dated)  DIAGNOSTIC FINDINGS:  11/04/22 - DG L foot and ankle  Left ankle:  Normal bone mineralization. Joint spaces are preserved. Unchanged moderate dorsal navicular degenerative spurring and adjacent 7 mm well corticated chronic ossicle dorsal to the talonavicular joint. No definite calcaneal coalition is seen. No acute fracture or dislocation. Mild pes  planus.   Left foot:  Minimal lateral great toe metatarsophalangeal degenerative spurring. Joint spaces are preserved. No acute fracture or dislocation.   IMPRESSION: 1. No acute fracture or dislocation. 2. Unchanged moderate dorsal navicular degenerative spurring and adjacent 7 mm well corticated chronic ossicle dorsal to the talonavicular joint. 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  PATIENT SURVEYS:  LEFS 23 / 80 = 28.7 %  COGNITION: Overall cognitive status: History of cognitive impairments - at baseline    SENSATION: WFL  EDEMA:  Figure 8: R = 59.5 cm, L = 60.5 cm  MUSCLE LENGTH: Hamstrings:  ITB:  Piriformis:  Hip flexors:  Quads:  Heelcord: Mild tight B  POSTURE:  rounded shoulders, forward head, flexed trunk , and B equinovalgus  PALPATION: TTP over dorsal and lateral L ankle  LOWER EXTREMITY ROM:  Active ROM Right eval Left eval  Ankle dorsiflexion 6 4  Ankle plantarflexion 54 50  Ankle inversion 22 10  Ankle eversion 18 8   Passive ROM Left eval  Ankle dorsiflexion 6  Ankle plantarflexion 52  Ankle inversion 22  Ankle eversion 20  (Blank rows = not tested)  LOWER EXTREMITY MMT:  MMT Right eval Left eval  Hip flexion 5 4+  Hip extension 4+ 4  Hip abduction 4 4  Hip adduction 4+ 4+  Hip internal rotation 5 4  Hip external rotation 5 4  Knee flexion 5 5  Knee extension 5 5  Ankle dorsiflexion 5 4 ^  Ankle plantarflexion 3 3  Ankle inversion 5 4  Ankle eversion 5 4 ^   (Blank rows = not tested, ^ = increased pain)  GAIT: Distance walked: Clinic distances Assistive device utilized: None Level of assistance: Complete Independence Gait pattern: decreased stance time- Left and antalgic Comments: B equinovalgus   TODAY'S TREATMENT:   01/22/23 THERAPEUTIC EXERCISE: to improve flexibility, strength and mobility.  Demonstration, verbal and tactile cues throughout for technique. Nustep L5 x 6  min (UE/LE) L ankle PF, EV, IV and DF x 10 each - GTB L/R SLS + opp LE  GTB 4-way SLR x 10, 2 pole A for balance Standing heel raises x 10 at counter - no pain reported Standing heel/toe raises at counter 2 x 10 - limited DF lift on L with toe raises     01/17/23 THERAPEUTIC EXERCISE: to improve flexibility, strength and mobility.  Demonstration, verbal and tactile cues throughout for technique. Nustep L5x33min UE/LE Active ROM all planes, circles CW/CCW  and PNF  x 10 ea L ankle EV,IV,PF YTB and RTB x 10 each Bridges with GTB 2x10 5 sec hold Hooklying clams unilateral GTB 10x5" S/L hip abduction x 10 bil GTB B with tactile cues to stay in sidelying and not roll back S/L clam GTB x 10 B Standing heel raises x 12 at counter - no pain reported   01/15/23 THERAPEUTIC EXERCISE: to improve flexibility, strength and mobility.  Demonstration, verbal and tactile cues throughout for technique. Nustep L5x95min UE/LE L ankle EV,IV,PF RTB x 10 each way- pain with IV Isometric ankle IV with ball 2x10 Bridges with GTB 2x10  Hooklying clams unilateral GTB 10x5" S/L hip abduction x 10 bil   PATIENT EDUCATION:  Education details: HEP update - standing heel/toe raises and HEP progression - green TB for ankle PF & EV   Person educated: Patient Education method: Explanation, Demonstration, Tactile cues, Verbal cues, and Handouts Education comprehension: verbalized understanding, returned demonstration, verbal cues required, tactile cues required, and needs further education  HOME EXERCISE PROGRAM: Access Code: [email or web address] URL: [email or web address] Date: 01/22/2023 Prepared by: Glenetta Hew  Exercises - Long Sitting Ankle Pumps (Mirrored)  - 2 x daily - 7 x weekly - 2 sets - 10 reps - 3 sec hold - Peroneal stretch (Mirrored)  - 2 x daily - 7 x weekly - 2 sets - 10 reps - 3-5 sec hold - Seated Heel Toe Raises  - 2 x daily - 7 x weekly - 2 sets - 10 reps - 3 sec hold - Isometric  Ankle Inversion  - 2 x daily - 7 x weekly - 2 sets - 10 reps - 3-5 sec hold - Isometric Ankle Eversion at Wall  - 2 x daily - 7 x weekly - 2 sets - 10 reps - 3-5 sec hold - Gastroc Stretch on Wall  - 2-3 x daily - 7 x weekly - 3 reps - 30 sec hold - Soleus Stretch on Wall  - 2-3 x daily - 7 x weekly - 3 reps - 30 sec hold - Hooklying Single Leg Bent Knee Fallouts with Resistance  - 1 x daily - 7 x weekly - 2 sets - 10 reps - 3 sec hold - Supine Bridge with Resistance Band  - 1 x daily - 7 x weekly - 2 sets - 10 reps - 5 sec hold - Seated Ankle Eversion with Resistance  - 1 x daily - 7 x weekly - 3 sets - 10 reps - Seated Ankle Plantarflexion with Resistance  - 1 x daily - 7 x weekly - 3 sets - 10 reps - Heel Toe Raises with Counter Support  - 1 x daily - 7 x weekly - 2 sets - 10 reps - 3 sec hold   ASSESSMENT:  CLINICAL IMPRESSION: Cottrell reports decreasing L ankle pain with walking and no pain at rest.  He was able to progress resistance to GTB with the L 4-way ankle strengthening but continues to require cues for proper movement patterns.  Introduced SLS + 4-way hip strengthening to continue to address proximal  weakness as well as initiate ankle proprioceptive training for improved stability.  We revisited standing heel raises with no increased pain reported, therefore added to HEP.  Rani will benefit from continued skilled PT to address ongoing strength and proprioceptive deficits to improve mobility and activity tolerance with decreased pain interference.   OBJECTIVE IMPAIRMENTS: Abnormal gait, decreased activity tolerance, decreased balance, decreased coordination, decreased knowledge of condition, decreased mobility, difficulty walking, decreased ROM, decreased strength, decreased safety awareness, increased fascial restrictions, impaired perceived functional ability, increased muscle spasms, impaired flexibility, improper body mechanics, postural dysfunction, and pain.   ACTIVITY  LIMITATIONS: standing, squatting, stairs, transfers, and locomotion level  PARTICIPATION LIMITATIONS: community activity and school  PERSONAL FACTORS: Education, Past/current experiences, Time since onset of injury/illness/exacerbation, and 3+ comorbidities: Sprain of anterior talofibular ligament of left ankle - 11/09/22, ADHD, cognitive impairment, cerebral infarction, intermittent L side weakness, encephalopathy due to COVID-19, h/o suicide attempt  are also affecting patient's functional outcome.   REHAB POTENTIAL: Good  CLINICAL DECISION MAKING: Evolving/moderate complexity  EVALUATION COMPLEXITY: Moderate   GOALS: Goals reviewed with patient? Yes  SHORT TERM GOALS: Target date: 01/22/2023   Patient will be independent with initial HEP. Baseline: To be established Goal status: MET - 01/15/23 - pt denies concerns  2.  Patient will report at least 25% improvement in L ankle/foot pain to improve QOL. Baseline: 8/10 Goal status: MET - 01/17/23 - Patient reports pain at 6/10 vs. 9/10 when he started  LONG TERM GOALS: Target date: 02/19/2023   Patient will be independent with advanced/ongoing HEP to improve outcomes and carryover.  Baseline:  Goal status: IN PROGRESS  2.  Patient will report at least 75% improvement in L ankle/foot pain to improve QOL. Baseline: 8/10 Goal status: IN PROGRESS  3.  Patient will demonstrate improved L ankle AROM to [facility] to allow for normal gait and stair mechanics. Baseline: Refer to above LE ROM tables Goal status: IN PROGRESS  4.  Patient will demonstrate improved L LE strength to >/= 4+/5 for improved stability and ease of mobility. Baseline: Refer to above LE MMT table Goal status: IN PROGRESS  5.  Patient will be able to ambulate 600' without AD or ankle ASO brace and normal gait pattern without increased foot/ankle pain to access community.  Baseline: Currently wearing ASO brace for walking but limited by increased pain Goal status: IN  PROGRESS  6. Patient will be able to ascend/descend stairs with 1 HR and reciprocal step pattern safely to access home and community.  Baseline: NT Goal status: IN PROGRESS  7.  Patient will report >/= 32/80 on LEFS to demonstrate improved functional ability. Baseline: 23 / 80 = 28.7 % Goal status: IN PROGRESS   PLAN:  PT FREQUENCY: 1-2x/week  PT DURATION: 8 weeks  PLANNED INTERVENTIONS: 97164- PT Re-evaluation, 97110-Therapeutic exercises, 97530- Therapeutic activity, [id number]- Neuromuscular re-education, 97535- Self Care, 16109- Manual therapy, [id number]- Gait training, [phone]- Vasopneumatic device, 97035- Ultrasound, [id number]- Ionotophoresis 4mg /ml Dexamethasone, Balance training, Stair training, Taping, Dry Needling, Joint mobilization, Joint manipulation, Cryotherapy, and Moist heat  PLAN FOR NEXT SESSION: gently progress L ankle ROM/flexibility and ankle strengthening/proprioceptive training; progress proximal LE strengthening; review & progress HEP as indicated  Marry Guan, PT  01/22/2023, 5:08 PM

## 2023-01-25 ENCOUNTER — Ambulatory Visit: Payer: BLUE CROSS/BLUE SHIELD

## 2023-01-25 DIAGNOSIS — R262 Difficulty in walking, not elsewhere classified: Secondary | ICD-10-CM

## 2023-01-25 DIAGNOSIS — M6281 Muscle weakness (generalized): Secondary | ICD-10-CM

## 2023-01-25 DIAGNOSIS — M25572 Pain in left ankle and joints of left foot: Secondary | ICD-10-CM | POA: Diagnosis not present

## 2023-01-25 NOTE — Therapy (Signed)
OUTPATIENT PHYSICAL THERAPY TREATMENT   Patient Name: Kemarrion Okray MRN: [id number] DOB:14-May-2000, 22 y.o., male Today's Date: 01/25/2023   END OF SESSION:  PT End of Session - 01/25/23 1658     Visit Number 7    Date for PT Re-Evaluation 02/19/23    Authorization Type [employer]    Authorization Time Period 12/25/22 - 02/19/23    Authorization - Visit Number 6    Authorization - Number of Visits 16    PT Start Time 1605    PT Stop Time 1653    PT Time Calculation (min) 48 min    Activity Tolerance Patient tolerated treatment well    Behavior During Therapy WFL for tasks assessed/performed;Flat affect                   Past Medical History:  Diagnosis Date   ADHD    COVID-19 virus infection    Hypoglycemia    MR (mental retardation)    Pituitary abnormality (HCC)    Seizures (HCC)    Stroke (HCC)    Thyroid disease    Past Surgical History:  Procedure Laterality Date   EYE SURGERY     x2   TONSILLECTOMY     Patient Active Problem List   Diagnosis Date Noted   Sprain of anterior talofibular ligament of left ankle 11/10/2022   Pain of neck with recent traumatic injury 05/31/2022   Retained foreign body of middle ear, bilateral 03/16/2022   Caregiver burden 07/05/2019   History of attempted suicide 07/05/2019   Constipation 11/12/2018   Abnormal EKG 11/12/2018   Encephalopathy due to COVID-19 virus 10/18/2018   Elevated alkaline phosphatase level 05/29/2018   Annual physical exam 10/31/2017   Healthy adult on routine physical examination 10/31/2017   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[facility])    Cognitive deficits    Intermittent left side weakness    Other cerebral infarction [facility])    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 No new complaints.   PAIN: Are you having pain? Yes: NPRS scale: 0/10 /10 Pain location: L lateral ankle Pain description: sharp Aggravating factors: walking Relieving factors: pain meds - ibuprofen  PERTINENT HISTORY:  Sprain of anterior talofibular ligament of left ankle - 11/09/22, ADHD, cognitive impairment, cerebral infarction, intermittent L side weakness, encephalopathy due to COVID-19, h/o suicide attempt  PRECAUTIONS: None  RED FLAGS: None  WEIGHT BEARING RESTRICTIONS: No - lace-up ankle brace as needed  FALLS:  Has patient fallen in last 6 months? Yes. Number of falls 1  LIVING ENVIRONMENT: Lives with: lives with their family Lives in: House/apartment Stairs: Yes: Internal: 14 steps; on right going up, on left going up, and can reach both Has following equipment at home: None  OCCUPATION: Unemployed  PLOF: Independent, Needs assistance with ADLs, and unable to drive  PATIENT GOALS: "Walk better w/o pain."   OBJECTIVE: (objective measures completed at initial evaluation unless otherwise dated)  DIAGNOSTIC FINDINGS:  11/04/22 - DG L foot and ankle  Left ankle:  Normal bone mineralization. Joint spaces are preserved. Unchanged moderate dorsal navicular degenerative spurring and adjacent 7 mm well corticated chronic ossicle dorsal to the talonavicular joint. No definite calcaneal coalition is seen. No acute fracture or dislocation. Mild pes planus.   Left foot:  Minimal lateral great toe  metatarsophalangeal degenerative spurring. Joint spaces are preserved. No acute fracture or dislocation.   IMPRESSION: 1. No acute fracture or dislocation. 2. Unchanged moderate dorsal navicular degenerative spurring and adjacent 7 mm well corticated chronic ossicle dorsal to the talonavicular joint. 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  PATIENT SURVEYS:  LEFS 23 / 80 = 28.7 %  COGNITION: Overall cognitive status: History of cognitive impairments - at baseline    SENSATION: WFL  EDEMA:  Figure 8: R = 59.5 cm, L = 60.5 cm  MUSCLE LENGTH: Hamstrings:  ITB:  Piriformis:  Hip flexors:  Quads:  Heelcord: Mild tight B  POSTURE:  rounded shoulders, forward head, flexed trunk , and B equinovalgus  PALPATION: TTP over dorsal and lateral L ankle  LOWER EXTREMITY ROM:  Active ROM Right eval Left eval L 01/25/23  Ankle dorsiflexion 6 4 3   Ankle plantarflexion 54 50 59  Ankle inversion 22 10 20   Ankle eversion 18 8 12    Passive ROM Left eval  Ankle dorsiflexion 6  Ankle plantarflexion 52  Ankle inversion 22  Ankle eversion 20  (Blank rows = not tested)  LOWER EXTREMITY MMT:  MMT Right eval Left eval  Hip flexion 5 4+  Hip extension 4+ 4  Hip abduction 4 4  Hip adduction 4+ 4+  Hip internal rotation 5 4  Hip external rotation 5 4  Knee flexion 5 5  Knee extension 5 5  Ankle dorsiflexion 5 4 ^  Ankle plantarflexion 3 3  Ankle inversion 5 4  Ankle eversion 5 4 ^   (Blank rows = not tested, ^ = increased pain)  GAIT: Distance walked: Clinic distances Assistive device utilized: None Level of assistance: Complete Independence Gait pattern: decreased stance time- Left and antalgic Comments: B equinovalgus   TODAY'S TREATMENT:  01/25/23 THERAPEUTIC EXERCISE: to improve flexibility, strength and mobility.  Demonstration, verbal and tactile cues throughout for technique. Measured L ankle AROM 4 way ankle x 15  GTB Standing heel raises 2 x 10 at counter Standing toe raise 2 x 10 at counter L SLS all  the way around with RLE 2x Step ups 6' LLE x 10 - mild hip pain Tandem stance 2x30"  L SLS with intermittent UE support x 10 seconds 01/22/23 THERAPEUTIC EXERCISE: to improve flexibility, strength and mobility.  Demonstration, verbal and tactile cues throughout for technique. Nustep L5 x 6 min (UE/LE) L ankle PF, EV, IV and DF x 10 each - GTB L/R SLS + opp LE GTB 4-way SLR x 10, 2 pole A for balance Standing heel raises x 10 at counter - no pain reported Standing heel/toe raises at counter 2 x 10 - limited DF lift on L with toe raises     01/17/23 THERAPEUTIC EXERCISE: to improve flexibility, strength and mobility.  Demonstration, verbal and tactile cues throughout for technique. Nustep L5x25min UE/LE Active ROM all planes, circles CW/CCW  and PNF  x 10 ea L ankle EV,IV,PF YTB and RTB x 10 each Bridges with GTB 2x10 5 sec hold Hooklying clams unilateral GTB 10x5" S/L hip abduction x 10 bil GTB B with tactile cues to stay in sidelying and not roll back S/L clam GTB x 10 B Standing heel raises x 12 at counter - no pain reported   01/15/23 THERAPEUTIC EXERCISE: to improve flexibility, strength and mobility.  Demonstration, verbal and tactile cues throughout for technique. Nustep L5x58min UE/LE L ankle EV,IV,PF RTB x 10 each way- pain with IV Isometric ankle IV with ball 2x10 Bridges with GTB 2x10  Hooklying clams unilateral GTB 10x5" S/L hip abduction x 10 bil   PATIENT EDUCATION:  Education details: HEP update - standing heel/toe raises and HEP progression - green TB for ankle PF & EV   Person educated: Patient Education method: Explanation, Demonstration, Tactile cues, Verbal cues, and Handouts Education comprehension: verbalized understanding, returned demonstration, verbal cues required, tactile cues required, and needs further education  HOME EXERCISE PROGRAM: Access Code:  [email or web address] URL: [email or web address] Date: 01/25/2023 Prepared by: Verta Ellen  Exercises - Long Sitting Ankle Pumps (Mirrored)  - 2 x daily - 7 x weekly - 2 sets - 10 reps - 3 sec hold - Peroneal stretch (Mirrored)  - 2 x daily - 7 x weekly - 2 sets - 10 reps - 3-5 sec hold - Seated Heel Toe Raises  - 2 x daily - 7 x weekly - 2 sets - 10 reps - 3 sec hold - Isometric Ankle Inversion  - 2 x daily - 7 x weekly - 2 sets - 10 reps - 3-5 sec hold - Isometric Ankle Eversion at Wall  - 2 x daily - 7 x weekly - 2 sets - 10 reps - 3-5 sec hold - Gastroc Stretch on Wall  - 2-3 x daily - 7 x weekly - 3 reps - 30 sec hold - Soleus Stretch on Wall  - 2-3 x daily - 7 x weekly - 3 reps - 30 sec hold - Hooklying Single Leg Bent Knee Fallouts with Resistance  - 1 x daily - 7 x weekly - 2 sets - 10 reps - 3 sec hold - Supine Bridge with Resistance Band  - 1 x daily - 7 x weekly - 2 sets - 10 reps - 5 sec hold - Seated Ankle Eversion with Resistance  - 1 x daily - 7 x weekly - 3 sets - 10 reps - Seated Ankle Plantarflexion with Resistance  - 1 x daily - 7 x weekly - 3 sets - 10 reps - Heel Toe Raises with Counter Support  - 1 x  daily - 7 x weekly - 2 sets - 10 reps - 3 sec hold - Standing 4-Way Leg Reach with Counter Support  - 1 x daily - 7 x weekly - 2 sets - 10 reps - Tandem Stance  - 1 x daily - 7 x weekly - 2 sets - 2 reps - 30 seconds hold   ASSESSMENT:  CLINICAL IMPRESSION:  Pt shows improved ankle IV, EV, PF measurements. Able to incorporate more WB exercise to improve foot/ankle stability and increase proprioception. He shows mild LOB with clock balance adducting the RLE and very difficult with L SLS. Good response overall to treatment and he is progressing toward goals. Gearald will benefit from continued skilled PT to address ongoing strength and proprioceptive deficits to improve mobility and activity tolerance with decreased pain interference.   OBJECTIVE IMPAIRMENTS: Abnormal  gait, decreased activity tolerance, decreased balance, decreased coordination, decreased knowledge of condition, decreased mobility, difficulty walking, decreased ROM, decreased strength, decreased safety awareness, increased fascial restrictions, impaired perceived functional ability, increased muscle spasms, impaired flexibility, improper body mechanics, postural dysfunction, and pain.   ACTIVITY LIMITATIONS: standing, squatting, stairs, transfers, and locomotion level  PARTICIPATION LIMITATIONS: community activity and school  PERSONAL FACTORS: Education, Past/current experiences, Time since onset of injury/illness/exacerbation, and 3+ comorbidities: Sprain of anterior talofibular ligament of left ankle - 11/09/22, ADHD, cognitive impairment, cerebral infarction, intermittent L side weakness, encephalopathy due to COVID-19, h/o suicide attempt  are also affecting patient's functional outcome.   REHAB POTENTIAL: Good  CLINICAL DECISION MAKING: Evolving/moderate complexity  EVALUATION COMPLEXITY: Moderate   GOALS: Goals reviewed with patient? Yes  SHORT TERM GOALS: Target date: 01/22/2023   Patient will be independent with initial HEP. Baseline: To be established Goal status: MET - 01/15/23 - pt denies concerns  2.  Patient will report at least 25% improvement in L ankle/foot pain to improve QOL. Baseline: 8/10 Goal status: MET - 01/17/23 - Patient reports pain at 6/10 vs. 9/10 when he started  LONG TERM GOALS: Target date: 02/19/2023   Patient will be independent with advanced/ongoing HEP to improve outcomes and carryover.  Baseline:  Goal status: IN PROGRESS  2.  Patient will report at least 75% improvement in L ankle/foot pain to improve QOL. Baseline: 8/10 Goal status: IN PROGRESS  3.  Patient will demonstrate improved L ankle AROM to [facility] to allow for normal gait and stair mechanics. Baseline: Refer to above LE ROM tables Goal status: IN PROGRESS- 01/25/23 improving  4.   Patient will demonstrate improved L LE strength to >/= 4+/5 for improved stability and ease of mobility. Baseline: Refer to above LE MMT table Goal status: IN PROGRESS  5.  Patient will be able to ambulate 600' without AD or ankle ASO brace and normal gait pattern without increased foot/ankle pain to access community.  Baseline: Currently wearing ASO brace for walking but limited by increased pain Goal status: IN PROGRESS  6. Patient will be able to ascend/descend stairs with 1 HR and reciprocal step pattern safely to access home and community.  Baseline: NT Goal status: IN PROGRESS  7.  Patient will report >/= 32/80 on LEFS to demonstrate improved functional ability. Baseline: 23 / 80 = 28.7 % Goal status: IN PROGRESS   PLAN:  PT FREQUENCY: 1-2x/week  PT DURATION: 8 weeks  PLANNED INTERVENTIONS: 97164- PT Re-evaluation, 97110-Therapeutic exercises, 97530- Therapeutic activity, [id number]- Neuromuscular re-education, 97535- Self Care, 16109- Manual therapy, [id number]- Gait training, 97016- Vasopneumatic device, [id number]- Ultrasound, 60454- Ionotophoresis 4mg /ml Dexamethasone, Balance training,  Stair training, Taping, Dry Needling, Joint mobilization, Joint manipulation, Cryotherapy, and Moist heat  PLAN FOR NEXT SESSION: ankle strengthening/proprioceptive training; progress proximal LE strengthening; review & progress HEP as indicated  Darleene Cleaver, PTA  01/25/2023, 5:14 PM

## 2023-01-29 ENCOUNTER — Ambulatory Visit: Payer: BLUE CROSS/BLUE SHIELD | Admitting: Physical Therapy

## 2023-02-04 ENCOUNTER — Telehealth: Payer: MEDICAID | Admitting: Family

## 2023-02-04 DIAGNOSIS — R6889 Other general symptoms and signs: Secondary | ICD-10-CM | POA: Diagnosis not present

## 2023-02-04 MED ORDER — BENZONATATE 100 MG PO CAPS: 100.0000 mg | ORAL_CAPSULE | Freq: Three times a day (TID) | ORAL | 0 refills | Status: DC | PRN

## 2023-02-04 MED ORDER — FLUTICASONE PROPIONATE 50 MCG/ACT NA SUSP: 2.0000 | Freq: Every day | NASAL | 6 refills | Status: DC

## 2023-02-04 NOTE — Progress Notes (Signed)

## 2023-02-05 ENCOUNTER — Ambulatory Visit: Payer: BLUE CROSS/BLUE SHIELD | Admitting: Physical Therapy

## 2023-02-08 ENCOUNTER — Ambulatory Visit: Payer: BLUE CROSS/BLUE SHIELD | Attending: Sports Medicine

## 2023-02-08 DIAGNOSIS — M25572 Pain in left ankle and joints of left foot: Secondary | ICD-10-CM | POA: Diagnosis present

## 2023-02-08 DIAGNOSIS — M6281 Muscle weakness (generalized): Secondary | ICD-10-CM | POA: Diagnosis present

## 2023-02-08 DIAGNOSIS — R262 Difficulty in walking, not elsewhere classified: Secondary | ICD-10-CM | POA: Insufficient documentation

## 2023-02-08 NOTE — Therapy (Addendum)
OUTPATIENT PHYSICAL THERAPY TREATMENT / DISCHARGE SUMMARY   Patient Name: Sean Kim MRN: [id number] DOB:[birth year], 22 y.o., male Today's Date: 02/08/2023   END OF SESSION:  PT End of Session - 02/08/23 1618     Visit Number 8    Date for PT Re-Evaluation 02/19/23    Authorization Type [employer]    Authorization Time Period 12/25/22 - 02/19/23    Authorization - Visit Number 7    Authorization - Number of Visits 16    PT Start Time 1520    PT Stop Time 1608    PT Time Calculation (min) 48 min    Activity Tolerance Patient tolerated treatment well    Behavior During Therapy WFL for tasks assessed/performed;Flat affect                    Past Medical History:  Diagnosis Date   ADHD    COVID-19 virus infection    Hypoglycemia    MR (mental retardation)    Pituitary abnormality (HCC)    Seizures (HCC)    Stroke (HCC)    Thyroid disease    Past Surgical History:  Procedure Laterality Date   EYE SURGERY     x2   TONSILLECTOMY     Patient Active Problem List   Diagnosis Date Noted   Sprain of anterior talofibular ligament of left ankle 11/10/2022   Pain of neck with recent traumatic injury 05/31/2022   Retained foreign body of middle ear, bilateral 03/16/2022   Caregiver burden 07/05/2019   History of attempted suicide 07/05/2019   Constipation 11/12/2018   Abnormal EKG 11/12/2018   Encephalopathy due to COVID-19 virus 10/18/2018   Elevated alkaline phosphatase level 05/29/2018   Annual physical exam 10/31/2017   Healthy adult on routine physical examination 10/31/2017   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[facility])    Cognitive deficits    Intermittent left side weakness    Other cerebral infarction [facility])    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   PAIN: Are you having pain? Yes: NPRS scale: 0/10 /10 Pain location: L lateral ankle Pain description: sharp Aggravating factors: walking Relieving factors: pain meds - ibuprofen  PERTINENT HISTORY:  Sprain of anterior talofibular ligament of left ankle - 11/09/22, ADHD, cognitive impairment, cerebral infarction, intermittent L side weakness, encephalopathy due to COVID-19, h/o suicide attempt  PRECAUTIONS: None  RED FLAGS: None  WEIGHT BEARING RESTRICTIONS: No - lace-up ankle brace as needed  FALLS:  Has patient fallen in last 6 months? Yes. Number of falls 1  LIVING ENVIRONMENT: Lives with: lives with their family Lives in: House/apartment Stairs: Yes: Internal: 14 steps; on right going up, on left going up, and can reach both Has following equipment at home: None  OCCUPATION: Unemployed  PLOF: Independent, Needs assistance with ADLs, and unable to drive  PATIENT GOALS: "Walk better w/o pain."   OBJECTIVE: (objective measures completed at initial evaluation unless otherwise dated)  DIAGNOSTIC FINDINGS:  11/04/22 - DG L foot and ankle  Left ankle:  Normal bone mineralization. Joint spaces are preserved. Unchanged moderate dorsal navicular degenerative spurring and adjacent 7 mm well corticated chronic ossicle dorsal to the talonavicular joint. No definite calcaneal coalition is seen. No acute fracture or dislocation. Mild pes planus.   Left foot:  Minimal lateral great toe  metatarsophalangeal degenerative spurring. Joint spaces are preserved. No acute fracture or dislocation.   IMPRESSION: 1. No acute fracture or dislocation. 2. Unchanged moderate dorsal navicular degenerative spurring and adjacent 7 mm well corticated chronic ossicle dorsal to the talonavicular joint. 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  PATIENT SURVEYS:  LEFS 23 / 80 = 28.7 %  COGNITION: Overall cognitive status: History of cognitive impairments - at baseline    SENSATION: WFL  EDEMA:  Figure 8: R = 59.5 cm, L = 60.5 cm  MUSCLE LENGTH: Hamstrings:  ITB:  Piriformis:  Hip flexors:  Quads:  Heelcord: Mild tight B  POSTURE:  rounded shoulders, forward head, flexed trunk , and B equinovalgus  PALPATION: TTP over dorsal and lateral L ankle  LOWER EXTREMITY ROM:  Active ROM Right eval Left eval L 01/25/23 L 02/08/23  Ankle dorsiflexion 6 4 3 5   Ankle plantarflexion 54 50 59 WFL  Ankle inversion 22 10 20 30   Ankle eversion 18 8 12 20    Passive ROM Left eval  Ankle dorsiflexion 6  Ankle plantarflexion 52  Ankle inversion 22  Ankle eversion 20  (Blank rows = not tested)  LOWER EXTREMITY MMT:  MMT Right eval Left eval L 02/08/23  Hip flexion 5 4+   Hip extension 4+ 4 4+  Hip abduction 4 4 5   Hip adduction 4+ 4+   Hip internal rotation 5 4 4+  Hip external rotation 5 4 4   Knee flexion 5 5   Knee extension 5 5   Ankle dorsiflexion 5 4 ^ 5  Ankle plantarflexion 3 3 5   Ankle inversion 5 4 5   Ankle eversion 5 4 ^ 5   (Blank rows = not tested, ^ = increased pain)  GAIT: Distance walked: Clinic distances Assistive device utilized: None Level of assistance: Complete Independence Gait pattern: decreased stance time- Left and antalgic Comments: B equinovalgus   TODAY'S TREATMENT:  02/08/23 THERAPEUTIC EXERCISE: to improve flexibility, strength and mobility.  Demonstration, verbal and tactile cues throughout for  technique. Bike L3x86min Goal Assessments: LEFS: 75 / 80 = 93.8 % Stairs: decreased eccentric  control of L ankle descending stairs Gait for 600' WNL Strength test Ankle AROM  Reviewed HEP 01/25/23 THERAPEUTIC EXERCISE: to improve flexibility, strength and mobility.  Demonstration, verbal and tactile cues throughout for technique. Measured L ankle AROM 4 way ankle x 15 GTB Standing heel raises 2 x 10 at counter Standing toe raise 2 x 10 at counter L SLS all the way around with RLE 2x Step ups 6' LLE x 10 - mild hip pain Tandem stance 2x30"  L SLS with intermittent UE support x 10 seconds 01/22/23 THERAPEUTIC EXERCISE: to improve flexibility, strength and mobility.  Demonstration, verbal and tactile cues throughout for technique. Nustep L5 x 6 min (UE/LE) L ankle PF, EV, IV and DF x 10 each - GTB L/R SLS + opp LE GTB 4-way SLR x 10, 2 pole A for balance Standing heel raises x 10 at counter - no pain reported Standing heel/toe raises at counter 2 x 10 - limited DF lift on L with toe raises     01/17/23 THERAPEUTIC EXERCISE: to improve flexibility, strength and mobility.  Demonstration, verbal and tactile cues throughout for technique. Nustep L5x65min UE/LE Active ROM all planes, circles CW/CCW  and PNF  x 10 ea L ankle EV,IV,PF YTB and RTB x 10 each Bridges with GTB 2x10 5 sec hold Hooklying clams unilateral GTB 10x5" S/L hip abduction x 10 bil GTB B with tactile cues to stay in sidelying and not roll back S/L clam GTB x 10 B Standing heel raises x 12 at counter - no pain reported   01/15/23 THERAPEUTIC EXERCISE: to improve flexibility, strength and mobility.  Demonstration, verbal and tactile cues throughout for technique. Nustep L5x83min UE/LE L ankle EV,IV,PF RTB x 10 each way- pain with IV Isometric ankle IV with ball 2x10 Bridges with GTB 2x10  Hooklying clams unilateral GTB 10x5" S/L hip abduction x 10 bil   PATIENT EDUCATION:  Education details: HEP update -  standing heel/toe raises and HEP progression - green TB for ankle PF & EV   Person educated: Patient Education method: Explanation, Demonstration, Tactile cues, Verbal cues, and Handouts Education comprehension: verbalized understanding, returned demonstration, verbal cues required, tactile cues required, and needs further education  HOME EXERCISE PROGRAM: Access Code: [email or web address] URL: [email or web address] Date: 01/25/2023 Prepared by: Verta Ellen  Exercises - Long Sitting Ankle Pumps (Mirrored)  - 2 x daily - 7 x weekly - 2 sets - 10 reps - 3 sec hold - Peroneal stretch (Mirrored)  - 2 x daily - 7 x weekly - 2 sets - 10 reps - 3-5 sec hold - Seated Heel Toe Raises  - 2 x daily - 7 x weekly - 2 sets - 10 reps - 3 sec hold - Isometric Ankle Inversion  - 2 x daily - 7 x weekly - 2 sets - 10 reps - 3-5 sec hold - Isometric Ankle Eversion at Wall  - 2 x daily - 7 x weekly - 2 sets - 10 reps - 3-5 sec hold - Gastroc Stretch on Wall  - 2-3 x daily - 7 x weekly - 3 reps - 30 sec hold - Soleus Stretch on Wall  - 2-3 x daily - 7 x weekly - 3 reps - 30 sec hold - Hooklying Single Leg Bent Knee Fallouts with Resistance  - 1 x daily - 7 x weekly - 2 sets - 10 reps - 3 sec hold - Supine Bridge with Resistance Band  - 1 x daily - 7 x  weekly - 2 sets - 10 reps - 5 sec hold - Seated Ankle Eversion with Resistance  - 1 x daily - 7 x weekly - 3 sets - 10 reps - Seated Ankle Plantarflexion with Resistance  - 1 x daily - 7 x weekly - 3 sets - 10 reps - Heel Toe Raises with Counter Support  - 1 x daily - 7 x weekly - 2 sets - 10 reps - 3 sec hold - Standing 4-Way Leg Reach with Counter Support  - 1 x daily - 7 x weekly - 2 sets - 10 reps - Tandem Stance  - 1 x daily - 7 x weekly - 2 sets - 2 reps - 30 seconds hold   ASSESSMENT:  CLINICAL IMPRESSION:  Pt has met all therapy goals. He has no trouble with stair navigation that indicates increased fall risk, although he does have slight decreased  eccentric control. He is able to walk for 600' with no issues. His LE strength is at least 4+/5 along with functional ankle AROM.  LEFS has improved with score. Pt reports wanting to make today his last PT visit showing no pain/limitation with functional activities.   OBJECTIVE IMPAIRMENTS: Abnormal gait, decreased activity tolerance, decreased balance, decreased coordination, decreased knowledge of condition, decreased mobility, difficulty walking, decreased ROM, decreased strength, decreased safety awareness, increased fascial restrictions, impaired perceived functional ability, increased muscle spasms, impaired flexibility, improper body mechanics, postural dysfunction, and pain.   ACTIVITY LIMITATIONS: standing, squatting, stairs, transfers, and locomotion level  PARTICIPATION LIMITATIONS: community activity and school  PERSONAL FACTORS: Education, Past/current experiences, Time since onset of injury/illness/exacerbation, and 3+ comorbidities: Sprain of anterior talofibular ligament of left ankle - 11/09/22, ADHD, cognitive impairment, cerebral infarction, intermittent L side weakness, encephalopathy due to COVID-19, h/o suicide attempt  are also affecting patient's functional outcome.   REHAB POTENTIAL: Good  CLINICAL DECISION MAKING: Evolving/moderate complexity  EVALUATION COMPLEXITY: Moderate   GOALS: Goals reviewed with patient? Yes  SHORT TERM GOALS: Target date: 01/22/2023   Patient will be independent with initial HEP. Baseline: To be established Goal status: MET - 01/15/23 - pt denies concerns  2.  Patient will report at least 25% improvement in L ankle/foot pain to improve QOL. Baseline: 8/10 Goal status: MET - 01/17/23 - Patient reports pain at 6/10 vs. 9/10 when he started  LONG TERM GOALS: Target date: 02/19/2023   Patient will be independent with advanced/ongoing HEP to improve outcomes and carryover.  Baseline:  Goal status: MET- 02/08/23  2.  Patient will report  at least 75% improvement in L ankle/foot pain to improve QOL. Baseline: 8/10 Goal status: MET- 02/08/23  3.  Patient will demonstrate improved L ankle AROM to [facility] to allow for normal gait and stair mechanics. Baseline: Refer to above LE ROM tables Goal status: MET- 02/08/23  4.  Patient will demonstrate improved L LE strength to >/= 4+/5 for improved stability and ease of mobility. Baseline: Refer to above LE MMT table Goal status: MET- 02/08/23  5.  Patient will be able to ambulate 600' without AD or ankle ASO brace and normal gait pattern without increased foot/ankle pain to access community.  Baseline: Currently wearing ASO brace for walking but limited by increased pain Goal status: MET- 02/08/23  6. Patient will be able to ascend/descend stairs with 1 HR and reciprocal step pattern safely to access home and community.  Baseline: NT Goal status: MET- 02/08/23  7.  Patient will report >/= 32/80 on LEFS to demonstrate  improved functional ability. Baseline: 23 / 80 = 28.7 % Goal status: MET- 02/08/23 LEFS: 75 / 80 = 93.8 %   PLAN:  PT FREQUENCY: 1-2x/week  PT DURATION: 8 weeks  PLANNED INTERVENTIONS: 97164- PT Re-evaluation, 97110-Therapeutic exercises, 97530- Therapeutic activity, 97112- Neuromuscular re-education, 97535- Self Care, 16109- Manual therapy, [id number]- Gait training, 97016- Vasopneumatic device, 97035- Ultrasound, 60454- Ionotophoresis 4mg /ml Dexamethasone, Balance training, Stair training, Taping, Dry Needling, Joint mobilization, Joint manipulation, Cryotherapy, and Moist heat  PLAN FOR NEXT SESSION: discharge  Darleene Cleaver, PTA  02/08/2023, 4:18 PM   PHYSICAL THERAPY DISCHARGE SUMMARY  Visits from Start of Care: 8  Current functional level related to goals / functional outcomes: Refer to above clinical impression and goal assessment.   Remaining deficits: None.   Education / Equipment: HEP  Patient agrees to discharge. Patient goals were met. Patient is  being discharged due to meeting the stated rehab goals.  Marry Guan, PT 02/08/23, 6:39 PM  [facility] [address]  [address] [place], [place], [address] Phone: [phone]   Fax:  [phone]

## 2023-03-12 ENCOUNTER — Ambulatory Visit (INDEPENDENT_AMBULATORY_CARE_PROVIDER_SITE_OTHER): Payer: BLUE CROSS/BLUE SHIELD

## 2023-03-12 ENCOUNTER — Other Ambulatory Visit: Payer: Self-pay

## 2023-03-12 ENCOUNTER — Ambulatory Visit (HOSPITAL_COMMUNITY)
Admission: RE | Admit: 2023-03-12 | Discharge: 2023-03-12 | Disposition: A | Discharge status: Home / Self Care | Payer: BLUE CROSS/BLUE SHIELD | Source: Ambulatory Visit | Attending: Emergency Medicine | Admitting: Emergency Medicine

## 2023-03-12 ENCOUNTER — Encounter (HOSPITAL_COMMUNITY): Payer: Self-pay

## 2023-03-12 VITALS — BP 104/75 | HR 77 | Temp 98.4°F | Resp 16

## 2023-03-12 DIAGNOSIS — M546 Pain in thoracic spine: Secondary | ICD-10-CM | POA: Diagnosis not present

## 2023-03-12 NOTE — Discharge Instructions (Signed)
 The x-ray of your spine did not reveal any signs of acute bony injury or dislocation.  Believe that you are dealing with some bruising of your back which is expected from falling on concrete.  Recommend that you apply either ice or heat to your back several times throughout the day, whichever feels better and begin ibuprofen  400 mg every 6 8 hours as needed.  You should be feeling better in the next 3 to 4 days but if you are not, please follow-up with your primary care provider to discuss whether or not further imaging is needed.  If you are feeling worse over the next 3 to 4 days, please go to the emergency room as 3D imaging such as a CT scan may be needed.   Thank you for visiting [facility] today.

## 2023-03-12 NOTE — ED Triage Notes (Signed)
 Slipped on ice/snow when walking.  Reports back hit concrete when feet flew out from under patient.  .  Incident occurred last night.  Patient is also complaining of headache, and middle of back.

## 2023-03-12 NOTE — ED Provider Notes (Signed)
 [facility]    CSN: [id number] Arrival date & time: 03/12/23  1649    HISTORY   Chief Complaint  Patient presents with   Appointment    17:00   Fall   HPI Sean Kim is a pleasant, 23 y.o. male who presents to urgent care today. Patient is here with mom today.  Patient states he slipped on ice last night when walking outside.  Patient states his feet slipped out from underneath him and he fell backward landing on his back, landed on concrete.  Patient states he did not hit his head but is currently having a headache and pain in the middle of his back.  Patient denies prior injury to back, bump on the back of his head, blurred vision, seeing sparks when he landed, nausea, vomiting.  The history is provided by the patient.   Past Medical History:  Diagnosis Date   ADHD    COVID-19 virus infection    Hypoglycemia    MR (mental retardation)    Pituitary abnormality (HCC)    Seizures (HCC)    Stroke [facility])    Thyroid disease    Patient Active Problem List   Diagnosis Date Noted   Sprain of anterior talofibular ligament of left ankle 11/10/2022   Pain of neck with recent traumatic injury 05/31/2022   Retained foreign body of middle ear, bilateral 03/16/2022   Caregiver burden 07/05/2019   History of attempted suicide 07/05/2019   Constipation 11/12/2018   Abnormal EKG 11/12/2018   Encephalopathy due to COVID-19 virus 10/18/2018   Elevated alkaline phosphatase level 05/29/2018   Annual physical exam 10/31/2017   Healthy adult on routine physical examination 10/31/2017   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  Authorizing Provider  benzonatate  (TESSALON  PERLES) 100 MG capsule Take 1 capsule (100 mg total) by mouth 3 (three) times daily as needed. Patient not taking: Reported on 03/12/2023 02/04/23   Lavell Bari LABOR, FNP  fluticasone  (FLONASE ) 50 MCG/ACT nasal spray Place 2 sprays into both nostrils daily. 02/04/23   Hawks, Bari A, FNP  Glucagon , rDNA, (GLUCAGON  EMERGENCY IJ) Inject 1 mg as directed as needed (blood sugar).     [provider]  hydrocortisone  (CORTEF ) 10 MG tablet 2 tablets each morning and 1 tablet in early afternoon for next three days, then 1 tablet each morning once a day    [provider]  levothyroxine  (SYNTHROID ) 137 MCG tablet Take 137 mcg by mouth every morning. 03/13/19   [provider]  oseltamivir  (TAMIFLU ) 75 MG capsule Take 1 capsule (75 mg total) by mouth 2 (two) times daily. Patient not taking: Reported on 12/25/2022 10/22/22   Theotis Haze ORN, NP    Family History Family History  Problem Relation Age of Onset   Healthy Mother    Hypertension Maternal Grandmother    Social History Social History   Tobacco Use   Smoking status: Never   Smokeless tobacco: Never  Vaping Use   Vaping status: Never Used  Substance Use Topics   Alcohol use: No   Drug use:  No   Allergies   Patient has no known allergies.  Review of Systems Review of Systems Pertinent findings revealed after performing a 14 point review of systems has been noted in the history of present illness.  Physical Exam Vital Signs BP 104/75 (BP Location: Right Arm)   Pulse 77   Temp 98.4 F (36.9 C) (Oral)   Resp 16   SpO2 98%   No data found.  Physical Exam Vitals and nursing note reviewed.  Constitutional:      General: He is not in acute distress.    Appearance: Normal appearance. He is normal weight. He is not ill-appearing.  HENT:     Head: Normocephalic and atraumatic.  Eyes:     Extraocular Movements: Extraocular movements intact.      Conjunctiva/sclera: Conjunctivae normal.     Pupils: Pupils are equal, round, and reactive to light.  Cardiovascular:     Rate and Rhythm: Normal rate and regular rhythm.  Pulmonary:     Effort: Pulmonary effort is normal.     Breath sounds: Normal breath sounds.  Musculoskeletal:        General: Normal range of motion.     Cervical back: Normal range of motion and neck supple.     Thoracic back: Tenderness and bony tenderness present.       Back:  Skin:    General: Skin is warm and dry.  Neurological:     General: No focal deficit present.     Mental Status: He is alert and oriented to person, place, and time. Mental status is at baseline.  Psychiatric:        Mood and Affect: Mood normal.        Behavior: Behavior normal.        Thought Content: Thought content normal.        Judgment: Judgment normal.     Visual Acuity Right Eye Distance:   Left Eye Distance:   Bilateral Distance:    Right Eye Near:   Left Eye Near:    Bilateral Near:     UC Couse / Diagnostics / Procedures:     Radiology No results found.  Procedures Procedures (including critical care time) EKG  Pending results:  Labs Reviewed - No data to display  Medications Ordered in UC: Medications - No data to display  UC Diagnoses / Final Clinical Impressions(s)   I have reviewed the triage vital signs and the nursing notes.  Pertinent labs & imaging results that were available during my care of the patient were reviewed by me and considered in my medical decision making (see chart for details).    Final diagnoses:  Acute bilateral thoracic back pain   Per dependent read of patient's thoracic spine x-ray, I do not see any acute bony injury.  We will contact patient with the final radiology report once we receive it.  Patient advised.  Recommend ibuprofen  and ice for pain relief.  Please see discharge instructions below for details of plan of care as provided to patient. ED Prescriptions    None    PDMP not reviewed this encounter.  Pending results:  Labs Reviewed - No data to display    Discharge Instructions      The x-ray of your spine did not reveal any signs of acute bony injury or dislocation.  Believe that you are dealing with some bruising of your back which is expected from falling on concrete.  Recommend that you apply either ice or  heat to your back several times throughout the day, whichever feels better and begin ibuprofen  400 mg every 6 8 hours as needed.  You should be feeling better in the next 3 to 4 days but if you are not, please follow-up with your primary care provider to discuss whether or not further imaging is needed.  If you are feeling worse over the next 3 to 4 days, please go to the emergency room as 3D imaging such as a CT scan may be needed.   Thank you for visiting [facility] today.      Disposition Upon Discharge:  Condition: stable for discharge home  Patient presented with an acute illness with associated systemic symptoms and significant discomfort requiring urgent management. In my opinion, this is a condition that a prudent lay person (someone who possesses an average knowledge of health and medicine) may potentially expect to result in complications if not addressed urgently such as respiratory distress, impairment of bodily function or dysfunction of bodily organs.   Routine symptom specific, illness specific and/or disease specific instructions were discussed with the patient and/or caregiver at length.   As such, the patient has been evaluated and assessed, work-up was performed and treatment was provided in alignment with urgent care protocols and evidence based medicine.  Patient/parent/caregiver has been advised that the patient may require follow up for further testing and treatment if the symptoms continue in spite of treatment, as clinically indicated and appropriate.  Patient/parent/caregiver has been  advised to return to the [facility] or PCP if no better; to PCP or the Emergency Department if new signs and symptoms develop, or if the current signs or symptoms continue to change or worsen for further workup, evaluation and treatment as clinically indicated and appropriate  The patient will follow up with their current PCP if and as advised. If the patient does not currently have a PCP we will assist them in obtaining one.   The patient may need specialty follow up if the symptoms continue, in spite of conservative treatment and management, for further workup, evaluation, consultation and treatment as clinically indicated and appropriate.  Patient/parent/caregiver verbalized understanding and agreement of plan as discussed.  All questions were addressed during visit.  Please see discharge instructions below for further details of plan.  This office note has been dictated using Teaching laboratory technician.  Unfortunately, this method of dictation can sometimes lead to typographical or grammatical errors.  I apologize for your inconvenience in advance if this occurs.  Please do not hesitate to reach out to me if clarification is needed.      Joesph Shaver Scales, [place] 03/12/23 [phone]

## 2023-03-26 ENCOUNTER — Ambulatory Visit: Payer: BLUE CROSS/BLUE SHIELD | Admitting: Family Medicine

## 2023-03-26 ENCOUNTER — Encounter: Payer: Self-pay | Admitting: Family Medicine

## 2023-03-26 VITALS — BP 114/82 | HR 93 | Ht 69.0 in | Wt 206.0 lb

## 2023-03-26 DIAGNOSIS — M25572 Pain in left ankle and joints of left foot: Secondary | ICD-10-CM | POA: Diagnosis not present

## 2023-03-26 NOTE — Progress Notes (Unsigned)
    SUBJECTIVE:   CHIEF COMPLAINT / HPI:   Sean Kim is a 23yo M w/ hx of intellectual delay, left ankle sprain presents for exacerbation of left ankle pain.  Mom helped to give history - Twisted his ankle a few months ago, has gone to PT for this.  He still occasionally has discomfort in the left ankle but overall improved from before - started to limp last night again. - Was at church yesterday morning, he was jumping around a lot and being very active.  Mom thinks that he may have injured himself during this time. -Pain is on both the lateral and medial aspect of his ankle.  Pain only occurs when he is walking and putting weight on the foot.  OBJECTIVE:   BP 114/82   Pulse 93   Ht 5\' 9"  (1.753 m)   Wt 206 lb (93.4 kg)   SpO2 100%   BMI 30.42 kg/m   General: Alert, pleasant man. NAD. HEENT: NCAT. MMM. CV: RRR, no murmurs.  Resp: CTAB, no wheezing or crackles. Normal WOB on RA.  Ext: Left ankle with normal active range of motion, no effusion, no tenderness to palpation.  There was some discomfort when patient placed weight on the foot.  He has normal gait.  ASSESSMENT/PLAN:   Assessment & Plan Acute left ankle pain Acute exacerbation of previously injured ankle.  Pain is well-controlled, gait normal, exam benign.  Imaging is not indicated at this time.  Discussed supportive management.  Family in agreement with plan.   Lincoln Brigham, MD [facility]

## 2023-03-26 NOTE — Patient Instructions (Signed)
Good to see you today - Thank you for coming in  Things we discussed today:  1) You may have re-injured your left ankle.  Reassuringly, your exam does not show any instability.  You do not need surgery at this time.  Your body should naturally recover from this over the next 2 weeks. -You can take Tylenol or ibuprofen as needed for pain control -You can apply ice to the ankle to help decrease swelling -Put a pillow under your left foot while you sleep, this can let swelling drain out of it at nighttime. -You can try gentle ankle range of movement exercises to encourage blood flow to the area.

## 2023-05-03 ENCOUNTER — Ambulatory Visit: Payer: BLUE CROSS/BLUE SHIELD

## 2023-05-03 ENCOUNTER — Telehealth: Payer: BLUE CROSS/BLUE SHIELD | Admitting: Family Medicine

## 2023-05-03 DIAGNOSIS — R197 Diarrhea, unspecified: Secondary | ICD-10-CM

## 2023-05-03 NOTE — Progress Notes (Signed)
 Due to the normal self limiting nature of diarrhea, it might be more serious based on your symptoms of amount and duration. WE need to have you seen in person at your PCP office or a local Urgent Care.   You will not be charged for this visit.

## 2023-05-04 ENCOUNTER — Emergency Department
Admission: EM | Admit: 2023-05-04 | Discharge: 2023-05-04 | Disposition: A | Discharge status: Home / Self Care | Payer: BLUE CROSS/BLUE SHIELD | Attending: Emergency Medicine | Admitting: Emergency Medicine

## 2023-05-04 DIAGNOSIS — K529 Noninfective gastroenteritis and colitis, unspecified: Secondary | ICD-10-CM | POA: Diagnosis not present

## 2023-05-04 DIAGNOSIS — E860 Dehydration: Secondary | ICD-10-CM | POA: Insufficient documentation

## 2023-05-04 DIAGNOSIS — R109 Unspecified abdominal pain: Secondary | ICD-10-CM | POA: Diagnosis present

## 2023-05-04 LAB — COMPREHENSIVE METABOLIC PANEL
ALT: 18 U/L (ref 0–44)
AST: 42 U/L — ABNORMAL HIGH (ref 15–41)
Albumin: 4.4 g/dL (ref 3.5–5.0)
Alkaline Phosphatase: 67 U/L (ref 38–126)
Anion gap: 11 (ref 5–15)
BUN: 12 mg/dL (ref 6–20)
CO2: 22 mmol/L (ref 22–32)
Calcium: 9.5 mg/dL (ref 8.9–10.3)
Chloride: 105 mmol/L (ref 98–111)
Creatinine, Ser: 0.72 mg/dL (ref 0.61–1.24)
GFR, Estimated: >60 mL/min (ref >60)
Glucose, Bld: 87 mg/dL (ref 70–99)
Potassium: 3.8 mmol/L (ref 3.5–5.1)
Sodium: 138 mmol/L (ref 135–145)
Total Bilirubin: 0.8 mg/dL (ref 0.0–1.2)
Total Protein: 8.6 g/dL — ABNORMAL HIGH (ref 6.5–8.1)

## 2023-05-04 LAB — URINALYSIS, ROUTINE W REFLEX MICROSCOPIC
Bilirubin Urine: NEGATIVE
Glucose, UA: NEGATIVE mg/dL
Hgb urine dipstick: NEGATIVE
Ketones, ur: 20 mg/dL — AB
Leukocytes,Ua: NEGATIVE
Nitrite: NEGATIVE
Protein, ur: NEGATIVE mg/dL
Specific Gravity, Urine: 1.025 (ref 1.005–1.030)
pH: 5 (ref 5.0–8.0)

## 2023-05-04 LAB — LIPASE, BLOOD: Lipase: 23 U/L (ref 11–51)

## 2023-05-04 LAB — CBC
HCT: 35.7 % — ABNORMAL LOW (ref 39.0–52.0)
Hemoglobin: 11.9 g/dL — ABNORMAL LOW (ref 13.0–17.0)
MCH: 27.9 pg (ref 26.0–34.0)
MCHC: 33.3 g/dL (ref 30.0–36.0)
MCV: 83.8 fL (ref 80.0–100.0)
Platelets: 288 10*3/uL (ref 150–400)
RBC: 4.26 MIL/uL (ref 4.22–5.81)
RDW: 15.1 % (ref 11.5–15.5)
WBC: 3.3 10*3/uL — ABNORMAL LOW (ref 4.0–10.5)
nRBC: 0 % (ref 0.0–0.2)

## 2023-05-04 MED ORDER — LOPERAMIDE HCL 2 MG PO CAPS
2.0000 mg | ORAL_CAPSULE | Freq: Once | ORAL | Status: AC
  Administered 2023-05-04: 2 mg via ORAL
  Filled 2023-05-04: qty 1

## 2023-05-04 MED ORDER — LACTATED RINGERS IV BOLUS
1000.0000 mL | Freq: Once | INTRAVENOUS | Status: AC
Start: 2023-05-04 — End: 2023-05-04
  Administered 2023-05-04: 1000 mL via INTRAVENOUS

## 2023-05-04 MED ORDER — ONDANSETRON HCL 4 MG/2ML IJ SOLN
4.0000 mg | Freq: Once | INTRAMUSCULAR | Status: AC
  Administered 2023-05-04: 4 mg via INTRAVENOUS
  Filled 2023-05-04: qty 2

## 2023-05-04 MED ORDER — ONDANSETRON 4 MG PO TBDP: 4.0000 mg | ORAL_TABLET | Freq: Three times a day (TID) | ORAL | 0 refills | Status: DC | PRN

## 2023-05-04 MED ORDER — LOPERAMIDE HCL 2 MG PO CAPS: 2.0000 mg | ORAL_CAPSULE | ORAL | 0 refills | Status: DC | PRN

## 2023-05-04 NOTE — ED Provider Notes (Signed)
 [facility] Provider Note    Event Date/Time   First MD Initiated Contact with Patient 05/04/23 1108     (approximate)   History   Chief Complaint Abdominal Pain   HPI  Sean Kim is a 23 y.o. male with past medical history of hypopituitarism, stroke, hypoglycemia, and intellectual disability who presents to the ED complaining of abdominal pain.  Patient reports that he has had 2 days of constant crampy pain across his entire abdomen with some nausea and diarrhea.  Speaking with his mother over the phone, he has been having 8-12 watery bowel movements per day over the past 24 hours, has additionally been feeling nauseous but has not vomited.  He has had decreased oral intake and has not wanted to take his medications recently.  He denies any fevers, cough, chest pain, shortness of breath, dysuria, or flank pain.     Physical Exam   Triage Vital Signs: ED Triage Vitals  Encounter Vitals Group     BP 05/04/23 0959 108/74     Systolic BP Percentile --      Diastolic BP Percentile --      Pulse Rate 05/04/23 0958 99     Resp 05/04/23 0958 16     Temp 05/04/23 0958 98.4 F (36.9 C)     Temp Source 05/04/23 0958 Oral     SpO2 05/04/23 0958 99 %     Weight 05/04/23 0959 205 lb 14.6 oz (93.4 kg)     Height 05/04/23 0959 5\' 9"  (1.753 m)     Head Circumference --      Peak Flow --      Pain Score 05/04/23 0959 9     Pain Loc --      Pain Education --      Exclude from Growth Chart --     Most recent vital signs: Vitals:   05/04/23 0959 05/04/23 1203  BP: 108/74 108/70  Pulse:  82  Resp:    Temp:    SpO2:  100%    Constitutional: Alert and oriented. Eyes: Conjunctivae are normal. Head: Atraumatic. Nose: No congestion/rhinnorhea. Mouth/Throat: Mucous membranes are dry. Cardiovascular: Normal rate, regular rhythm. Grossly normal heart sounds.  2+ radial pulses bilaterally. Respiratory: Normal respiratory effort.  No retractions. Lungs  CTAB. Gastrointestinal: Soft and nontender. No distention. Musculoskeletal: No lower extremity tenderness nor edema.  Neurologic:  Normal speech and language. No gross focal neurologic deficits are appreciated.    ED Results / Procedures / Treatments   Labs (all labs ordered are listed, but only abnormal results are displayed) Labs Reviewed  COMPREHENSIVE METABOLIC PANEL - Abnormal; Notable for the following components:      Result Value   Total Protein 8.6 (*)    AST 42 (*)    All other components within normal limits  CBC - Abnormal; Notable for the following components:   WBC 3.3 (*)    Hemoglobin 11.9 (*)    HCT 35.7 (*)    All other components within normal limits  URINALYSIS, ROUTINE W REFLEX MICROSCOPIC - Abnormal; Notable for the following components:   Color, Urine YELLOW (*)    APPearance HAZY (*)    Ketones, ur 20 (*)    All other components within normal limits  LIPASE, BLOOD    PROCEDURES:  Critical Care performed: No  Procedures   MEDICATIONS ORDERED IN ED: Medications  lactated ringers bolus 1,000 mL (0 mLs Intravenous Stopped 05/04/23 1306)  ondansetron (ZOFRAN) injection  4 mg (4 mg Intravenous Given 05/04/23 1154)  loperamide (IMODIUM) capsule 2 mg (2 mg Oral Given 05/04/23 1214)     IMPRESSION / MDM / ASSESSMENT AND PLAN / ED COURSE  I reviewed the triage vital signs and the nursing notes.                              23 y.o. male with past medical history of hypopituitarism, hypoglycemia, stroke, and intellectual disability who presents to the ED complaining of watery diarrhea with nausea and decreased oral intake over the past 2 days.  Patient's presentation is most consistent with acute presentation with potential threat to life or bodily function.  Differential diagnosis includes, but is not limited to, gastroenteritis, dehydration, electrolyte abnormality, AKI, UTI.  Patient nontoxic-appearing and in no acute distress, vital signs are  unremarkable.  He has a benign abdominal exam and symptoms seem most consistent with a gastroenteritis.  He does appear clinically dehydrated, labs without significant electrolyte abnormality, AKI, anemia, or leukocytosis.  LFTs and lipase are unremarkable, urinalysis with no signs of infection.  We will hydrate with IV fluids, treat symptomatically with IV Zofran and loperamide.  Patient feeling better following Zofran and loperamide, tolerating oral intake without difficulty.  He is appropriate for discharge home with outpatient follow-up, plan discussed with mother over the phone who is in agreement.  Patient encouraged to return to the ED for new or worsening symptoms.      FINAL CLINICAL IMPRESSION(S) / ED DIAGNOSES   Final diagnoses:  Gastroenteritis  Dehydration     Rx / DC Orders   ED Discharge Orders          Ordered    ondansetron (ZOFRAN-ODT) 4 MG disintegrating tablet  Every 8 hours PRN        05/04/23 1340    loperamide (IMODIUM) 2 MG capsule  As needed        05/04/23 1340             Note:  This document was prepared using Dragon voice recognition software and may include unintentional dictation errors.   Chesley Noon, MD 05/04/23 1341

## 2023-05-04 NOTE — ED Notes (Signed)
 First Nurse Note: Pt to ED via [facility] from home for diarrhea and abdominal pain for the last 3 days. Pts mom has been given him some kind of medication but states that it is not helping. Pt is A & O, ambulates without assistance. Pt has not had N/V.

## 2023-05-04 NOTE — ED Triage Notes (Signed)
 Pt here with abd pain x3 days. Pt states he has pain all over his abd with the diarrhea, denies bleeding. Pt denies NV.

## 2023-05-04 NOTE — ED Notes (Signed)
 X2 attempts at IV establishment unsuccessfully. Another RN at bedside attempting to establish IV access for medications at this time.

## 2023-05-07 ENCOUNTER — Ambulatory Visit

## 2023-05-22 ENCOUNTER — Inpatient Hospital Stay (HOSPITAL_COMMUNITY): Payer: MEDICAID

## 2023-05-22 ENCOUNTER — Inpatient Hospital Stay (HOSPITAL_COMMUNITY)
Admission: EM | Admit: 2023-05-22 | Discharge: 2023-05-25 | DRG: 644 | Disposition: A | Discharge status: Home / Self Care | Payer: MEDICAID | Attending: Family Medicine | Admitting: Family Medicine

## 2023-05-22 ENCOUNTER — Other Ambulatory Visit: Payer: Self-pay

## 2023-05-22 ENCOUNTER — Emergency Department (HOSPITAL_COMMUNITY): Payer: MEDICAID

## 2023-05-22 ENCOUNTER — Encounter (HOSPITAL_COMMUNITY): Payer: Self-pay

## 2023-05-22 DIAGNOSIS — E272 Addisonian crisis: Secondary | ICD-10-CM | POA: Diagnosis present

## 2023-05-22 DIAGNOSIS — E2740 Unspecified adrenocortical insufficiency: Principal | ICD-10-CM | POA: Diagnosis present

## 2023-05-22 DIAGNOSIS — I4581 Long QT syndrome: Secondary | ICD-10-CM | POA: Insufficient documentation

## 2023-05-22 DIAGNOSIS — F909 Attention-deficit hyperactivity disorder, unspecified type: Secondary | ICD-10-CM | POA: Diagnosis present

## 2023-05-22 DIAGNOSIS — Z79890 Hormone replacement therapy (postmenopausal): Secondary | ICD-10-CM | POA: Diagnosis not present

## 2023-05-22 DIAGNOSIS — Z8616 Personal history of COVID-19: Secondary | ICD-10-CM | POA: Diagnosis not present

## 2023-05-22 DIAGNOSIS — R410 Disorientation, unspecified: Secondary | ICD-10-CM | POA: Diagnosis not present

## 2023-05-22 DIAGNOSIS — E860 Dehydration: Secondary | ICD-10-CM | POA: Diagnosis present

## 2023-05-22 DIAGNOSIS — E038 Other specified hypothyroidism: Secondary | ICD-10-CM | POA: Diagnosis present

## 2023-05-22 DIAGNOSIS — Z8249 Family history of ischemic heart disease and other diseases of the circulatory system: Secondary | ICD-10-CM | POA: Diagnosis not present

## 2023-05-22 DIAGNOSIS — R519 Headache, unspecified: Secondary | ICD-10-CM | POA: Diagnosis not present

## 2023-05-22 DIAGNOSIS — F79 Unspecified intellectual disabilities: Secondary | ICD-10-CM | POA: Diagnosis present

## 2023-05-22 DIAGNOSIS — R112 Nausea with vomiting, unspecified: Secondary | ICD-10-CM | POA: Diagnosis present

## 2023-05-22 DIAGNOSIS — Z91148 Patient's other noncompliance with medication regimen for other reason: Secondary | ICD-10-CM

## 2023-05-22 DIAGNOSIS — R569 Unspecified convulsions: Secondary | ICD-10-CM | POA: Diagnosis present

## 2023-05-22 DIAGNOSIS — Z789 Other specified health status: Secondary | ICD-10-CM

## 2023-05-22 DIAGNOSIS — Z1152 Encounter for screening for COVID-19: Secondary | ICD-10-CM | POA: Diagnosis not present

## 2023-05-22 DIAGNOSIS — N179 Acute kidney failure, unspecified: Secondary | ICD-10-CM

## 2023-05-22 DIAGNOSIS — R109 Unspecified abdominal pain: Secondary | ICD-10-CM | POA: Insufficient documentation

## 2023-05-22 DIAGNOSIS — R451 Restlessness and agitation: Secondary | ICD-10-CM | POA: Insufficient documentation

## 2023-05-22 DIAGNOSIS — H50012 Monocular esotropia, left eye: Secondary | ICD-10-CM | POA: Diagnosis present

## 2023-05-22 DIAGNOSIS — E162 Hypoglycemia, unspecified: Secondary | ICD-10-CM | POA: Diagnosis present

## 2023-05-22 DIAGNOSIS — J329 Chronic sinusitis, unspecified: Secondary | ICD-10-CM | POA: Diagnosis present

## 2023-05-22 DIAGNOSIS — R197 Diarrhea, unspecified: Secondary | ICD-10-CM

## 2023-05-22 HISTORY — DX: Acute kidney failure, unspecified: N17.9

## 2023-05-22 LAB — CBC WITH DIFFERENTIAL/PLATELET
Abs Immature Granulocytes: 0.04 10*3/uL (ref 0.00–0.07)
Basophils Absolute: 0 10*3/uL (ref 0.0–0.1)
Basophils Relative: 1 %
Eosinophils Absolute: 0.1 10*3/uL (ref 0.0–0.5)
Eosinophils Relative: 2 %
HCT: 36.7 % — ABNORMAL LOW (ref 39.0–52.0)
Hemoglobin: 11.4 g/dL — ABNORMAL LOW (ref 13.0–17.0)
Immature Granulocytes: 1 %
Lymphocytes Relative: 31 %
Lymphs Abs: 2.5 10*3/uL (ref 0.7–4.0)
MCH: 27.5 pg (ref 26.0–34.0)
MCHC: 31.1 g/dL (ref 30.0–36.0)
MCV: 88.6 fL (ref 80.0–100.0)
Monocytes Absolute: 0.8 10*3/uL (ref 0.1–1.0)
Monocytes Relative: 9 %
Neutro Abs: 4.7 10*3/uL (ref 1.7–7.7)
Neutrophils Relative %: 56 %
Platelets: UNDETERMINED 10*3/uL (ref 150–400)
RBC: 4.14 MIL/uL — ABNORMAL LOW (ref 4.22–5.81)
RDW: 15 % (ref 11.5–15.5)
Smear Review: UNDETERMINED
WBC: 8.2 10*3/uL (ref 4.0–10.5)
nRBC: 0 % (ref 0.0–0.2)

## 2023-05-22 LAB — URINALYSIS, W/ REFLEX TO CULTURE (INFECTION SUSPECTED)
Bilirubin Urine: NEGATIVE
Glucose, UA: NEGATIVE mg/dL
Hgb urine dipstick: NEGATIVE
Ketones, ur: 20 mg/dL — AB
Leukocytes,Ua: NEGATIVE
Nitrite: NEGATIVE
Protein, ur: 30 mg/dL — AB
Specific Gravity, Urine: 1.045 — ABNORMAL HIGH (ref 1.005–1.030)
pH: 5 (ref 5.0–8.0)

## 2023-05-22 LAB — LIPASE, BLOOD: Lipase: 23 U/L (ref 11–51)

## 2023-05-22 LAB — COMPREHENSIVE METABOLIC PANEL
ALT: 15 U/L (ref 0–44)
AST: 32 U/L (ref 15–41)
Albumin: 4.3 g/dL (ref 3.5–5.0)
Alkaline Phosphatase: 55 U/L (ref 38–126)
Anion gap: 14 (ref 5–15)
BUN: 20 mg/dL (ref 6–20)
CO2: 15 mmol/L — ABNORMAL LOW (ref 22–32)
Calcium: 9.3 mg/dL (ref 8.9–10.3)
Chloride: 107 mmol/L (ref 98–111)
Creatinine, Ser: 2.06 mg/dL — ABNORMAL HIGH (ref 0.61–1.24)
GFR, Estimated: 46 mL/min — ABNORMAL LOW (ref >60)
Glucose, Bld: 89 mg/dL (ref 70–99)
Potassium: 4 mmol/L (ref 3.5–5.1)
Sodium: 136 mmol/L (ref 135–145)
Total Bilirubin: 1.5 mg/dL — ABNORMAL HIGH (ref 0.0–1.2)
Total Protein: 8.7 g/dL — ABNORMAL HIGH (ref 6.5–8.1)

## 2023-05-22 LAB — CBC
HCT: 29.3 % — ABNORMAL LOW (ref 39.0–52.0)
Hemoglobin: 9.3 g/dL — ABNORMAL LOW (ref 13.0–17.0)
MCH: 27.3 pg (ref 26.0–34.0)
MCHC: 31.7 g/dL (ref 30.0–36.0)
MCV: 85.9 fL (ref 80.0–100.0)
Platelets: 216 10*3/uL (ref 150–400)
RBC: 3.41 MIL/uL — ABNORMAL LOW (ref 4.22–5.81)
RDW: 14.7 % (ref 11.5–15.5)
WBC: 6.8 10*3/uL (ref 4.0–10.5)
nRBC: 0 % (ref 0.0–0.2)

## 2023-05-22 LAB — PROTIME-INR
INR: 1.4 — ABNORMAL HIGH (ref 0.8–1.2)
Prothrombin Time: 17.6 s — ABNORMAL HIGH (ref 11.4–15.2)

## 2023-05-22 LAB — RESPIRATORY PANEL BY PCR

## 2023-05-22 LAB — RESP PANEL BY RT-PCR (RSV, FLU A&B, COVID)  RVPGX2
Influenza A by PCR: NEGATIVE
Influenza B by PCR: NEGATIVE
Resp Syncytial Virus by PCR: NEGATIVE
SARS Coronavirus 2 by RT PCR: NEGATIVE

## 2023-05-22 LAB — APTT: aPTT: 42 s — ABNORMAL HIGH (ref 24–36)

## 2023-05-22 LAB — I-STAT CG4 LACTIC ACID, ED: Lactic Acid, Venous: 1.2 mmol/L (ref 0.5–1.9)

## 2023-05-22 LAB — CREATININE, SERUM
Creatinine, Ser: 1.33 mg/dL — ABNORMAL HIGH (ref 0.61–1.24)
GFR, Estimated: >60 mL/min (ref >60)

## 2023-05-22 LAB — TSH: TSH: 6.186 u[IU]/mL — ABNORMAL HIGH (ref 0.350–4.500)

## 2023-05-22 LAB — CORTISOL: Cortisol, Plasma: 96.8 ug/dL

## 2023-05-22 LAB — GLUCOSE, CAPILLARY: Glucose-Capillary: 227 mg/dL — ABNORMAL HIGH (ref 70–99)

## 2023-05-22 LAB — HIV ANTIBODY (ROUTINE TESTING W REFLEX): HIV Screen 4th Generation wRfx: NONREACTIVE

## 2023-05-22 LAB — CBG MONITORING, ED: Glucose-Capillary: 106 mg/dL — ABNORMAL HIGH (ref 70–99)

## 2023-05-22 MED ORDER — ONDANSETRON HCL 4 MG/2ML IJ SOLN
4.0000 mg | Freq: Once | INTRAMUSCULAR | Status: AC
  Administered 2023-05-22: 4 mg via INTRAVENOUS
  Filled 2023-05-22: qty 2

## 2023-05-22 MED ORDER — ACETAMINOPHEN 650 MG RE SUPP
650.0000 mg | Freq: Four times a day (QID) | RECTAL | Status: DC | PRN
Start: 2023-05-22 — End: 2023-05-25

## 2023-05-22 MED ORDER — LACTATED RINGERS IV BOLUS
1000.0000 mL | Freq: Once | INTRAVENOUS | Status: AC
  Administered 2023-05-22: 1000 mL via INTRAVENOUS

## 2023-05-22 MED ORDER — HYDROCORTISONE SOD SUC (PF) 100 MG IJ SOLR
50.0000 mg | Freq: Four times a day (QID) | INTRAMUSCULAR | Status: AC
  Administered 2023-05-22 – 2023-05-23 (×4): 50 mg via INTRAVENOUS
  Filled 2023-05-22 (×4): qty 1

## 2023-05-22 MED ORDER — LACTATED RINGERS IV SOLN: INTRAVENOUS | Status: AC

## 2023-05-22 MED ORDER — HYDROCORTISONE SOD SUC (PF) 100 MG IJ SOLR
100.0000 mg | Freq: Once | INTRAMUSCULAR | Status: AC
  Administered 2023-05-22: 100 mg via INTRAVENOUS
  Filled 2023-05-22: qty 2

## 2023-05-22 MED ORDER — ALUM & MAG HYDROXIDE-SIMETH 200-200-20 MG/5ML PO SUSP
30.0000 mL | Freq: Once | ORAL | Status: AC
  Administered 2023-05-22: 30 mL via ORAL
  Filled 2023-05-22: qty 30

## 2023-05-22 MED ORDER — FAMOTIDINE IN NACL 20-0.9 MG/50ML-% IV SOLN
20.0000 mg | Freq: Once | INTRAVENOUS | Status: AC
  Administered 2023-05-22: 20 mg via INTRAVENOUS
  Filled 2023-05-22: qty 50

## 2023-05-22 MED ORDER — IOHEXOL 350 MG/ML SOLN
75.0000 mL | Freq: Once | INTRAVENOUS | Status: AC | PRN
  Administered 2023-05-22: 75 mL via INTRAVENOUS

## 2023-05-22 MED ORDER — IBUPROFEN 200 MG PO TABS: 600.0000 mg | ORAL_TABLET | Freq: Four times a day (QID) | ORAL | Status: DC | PRN

## 2023-05-22 MED ORDER — METOCLOPRAMIDE HCL 5 MG/ML IJ SOLN: 5.0000 mg | Freq: Three times a day (TID) | INTRAMUSCULAR | Status: DC | PRN

## 2023-05-22 MED ORDER — LEVOTHYROXINE SODIUM 25 MCG PO TABS
137.0000 ug | ORAL_TABLET | Freq: Every day | ORAL | Status: DC
  Administered 2023-05-23 – 2023-05-25 (×3): 137 ug via ORAL
  Filled 2023-05-22 (×3): qty 1

## 2023-05-22 MED ORDER — ONDANSETRON 4 MG PO TBDP
4.0000 mg | ORAL_TABLET | Freq: Three times a day (TID) | ORAL | Status: DC | PRN
  Filled 2023-05-22: qty 1

## 2023-05-22 MED ORDER — ENOXAPARIN SODIUM 40 MG/0.4ML IJ SOSY
40.0000 mg | PREFILLED_SYRINGE | INTRAMUSCULAR | Status: DC
  Administered 2023-05-22: 40 mg via SUBCUTANEOUS
  Filled 2023-05-22 (×2): qty 0.4

## 2023-05-22 MED ORDER — ACETAMINOPHEN 325 MG PO TABS
650.0000 mg | ORAL_TABLET | Freq: Four times a day (QID) | ORAL | Status: DC | PRN
  Administered 2023-05-22 – 2023-05-24 (×3): 650 mg via ORAL
  Filled 2023-05-22 (×3): qty 2

## 2023-05-22 NOTE — Assessment & Plan Note (Signed)
 CT without acute abnormality.  May be related to cramping from GI distress and persistent vomiting. - Consider RUQ ultrasound for cholecystitis rule out if symptoms persist

## 2023-05-22 NOTE — ED Notes (Signed)
 I tried to obtain an IV on pt, but was unsuccessful

## 2023-05-22 NOTE — H&P (Addendum)
 Hospital Admission History and Physical Service Pager: [phone]  Patient name: Sean Kim Medical record number: [id number] Date of Birth: 15-Jun-2000 Age: 23 y.o. Gender: male  Primary Care Provider: Levin Erp, MD Consultants: none Code Status: Full  Preferred Emergency Contact:  Contact Information     Name Relation Home Work Mobile   [place] Mother [phone]  [phone]   Connor,Darryl Father [phone]        Other Contacts   None on File     Chief Complaint: nausea, vomiting  Assessment and Plan: Sean Kim is a 23 y.o. male presenting with nausea, vomiting as well as single episode of syncope. Differential for presentation of this includes gastroenteritis, cholecystitis, adrenal crisis.   - Suspicious for food vs viral gastroenteritis given description of symptoms however timeline (3-4 weeks) is surprising.  - Cholecystitis less likely given negative CT, though this could have been missed without US imaging. Symptoms did subjectively worsen on one occasion after pt ate a few bites of a hamburger and fries. - Adrenal crisis is certainly possible in the setting of not N/V/D and unable to keep down meds. Also borderline hypotensive.  Assessment & Plan Nausea vomiting and diarrhea Unclear origin of these symptoms which have been ongoing for 3-4 weeks. Does have concerns for dehydration given decreased urinary output, orthostatics and borderline hypotensive BP. - Admit to FMTS, attending Dr. Manson Passey -Med telemetry, Vital signs per floor -Follow-up blood cultures - GI stool panel for further infectious workup --Ordered C. Diff PCR (given watery diarrhea for over 5 days) - Zofran 4 mg q8 PRN -Holding Imodium at this time pending potential infectious workup - Maintenance IV fluids, Will give additional 1L bolus - AM BMP Abdominal pain CT without acute abnormality.  May be related to cramping from GI distress and persistent vomiting. - Consider  RUQ ultrasound for cholecystitis rule out if symptoms persist AKI (acute kidney injury) (HCC) Creatinine 2.06. Likely pre-renal 2/2 due to dehydration from N/V/D.  S/p 1 L IV fluid bolus in the ED. Hope to improve with fluid resuscitation. - Continue maintenance IV fluids and encourage good p.o. intake -- Avoid nephrotoxic agents - AM BMP to trend Headache Suspect likely due to dehydration.  CT head negative. -Tylenol 650 mg q6 PRN   Adrenal crisis (HCC) Last time patient had adrenal crisis he presented with the same symptoms.  S/p 100 mg IV Cortef bolus in the ED. - Continue with 50 mg IV Cortef q6 for 24 hours - Will obtain ACTH and plasma cortisol - AM BMP, ACTH, plasma cortisol to trend Chronic health problem Hypoglycemia-held home glucagon, CBG Q8H Hypothyroidism- Last TSH 2020. Ordered TSH. continued on home Synthroid 137 mcg daily   FEN/GI: Regular diet VTE Prophylaxis: Levonox  Disposition: med surg  History of Present Illness:  Sean Kim is a 23 y.o. male presenting with Nausea and vomiting.  Symptoms originally started on February 28 with nausea and vomiting.  Patient also had several episodes of diarrhea.  Mom states he was not "being himself" prompting her to call 911.  He went to the ED where he was diagnosed with viral gastroenteritis and sent home with Zofran and Imodium.  He improved for several days, but then has had intermittent symptoms since then coupled with abdominal pain and headache.  Because of nausea and vomiting, the patient has not been able to keep his meds down consistently for the last couple of weeks.  Mom also reports he does not seem to be aware  of when he is about to vomit or have an episode of diarrhea, so he does not always make it to the bathroom in time.  Events of yesterday or unclear as no one was around to directly witness the incident, however mom reports that patient's brother found him on the floor of the bathroom.  It sounds like the  patient tried to get up quickly from bed to go to the bathroom for an episode of vomiting/diarrhea and fell.  It is not clear if he was dizzy and tripped or if he had a true syncopal episode.  His brother heard a loud bang and came to check on him; found him on the ground, reports he was not shaking or having any seizure-like activity.  The patient did apparently hit his head.  Mom shares that the patient had a stroke at age 59, and has had prior episodes of seizures several years ago though these resolved and he is no longer under the care of neurology or requiring antiseizure medication.  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  Given everything that has been going off, patient was seen by his endocrinologist earlier today who were concerned for adrenal crisis and recommended patient present to emergency department.  In the ED, patient had head CT, CT A/P which were both without acute abnormality.  Labs with evidence of dehydration. respiratory panel negative.  He received 1 L LR IVF bolus as well as 100 mg Solu-Cortef.  Tachycardia resolved and blood pressure improved somewhat.  Symptom management with Pepcid, Zofran, Maalox/Mylanta with some improvement.  Review Of Systems: Per HPI.  Pertinent Past Medical History: Developmental delay Seizures Thyroid disease Hypoglycemia Remainder reviewed in history tab.   Pertinent Past Surgical History: Tonsillectomy Remainder reviewed in history tab.   Pertinent Social History: Tobacco use: No Alcohol use: No Other Substance use: No Lives with Mother, dad, brother, sister  Pertinent Family History: none Remainder reviewed in history tab.   Important Outpatient Medications: Levothyroxine 137 mcg daily Cortef 10 mg daily (30 mg daily when sick) Remainder reviewed in medication history.   Objective: BP 99/72 (BP Location: Right Arm)   Pulse 80    Temp 98 F (36.7 C) (Oral)   Resp 18   Ht 5\' 9"  (1.753 m)   Wt 93.4 kg   SpO2 90%   BMI 30.41 kg/m  Exam: General: Lying in bed, does make eye contact but minimal verbal communication.  Nods and shakes head to answer questions.  NAD. HEENT: normocephalic, PERRLA, EOM grossly intact, mucous membranes dry. Cardio: Regular rate, regular rhythm, no murmurs on exam. Capillary refill ~2-3 secs Pulm: Clear, no wheezing, no crackles. No increased work of breathing. Abdominal: bowel sounds present, soft, non-tender, non-distended. Extremities: no peripheral edema. Moves all extremities equally. Neuro: Alert, no facial asymmetry.   Labs:  CBC BMET  Recent Labs  Lab 05/22/23 0910  WBC 8.2  HGB 11.4*  HCT 36.7*  PLT PLATELET CLUMPS NOTED ON SMEAR, UNABLE TO ESTIMATE   Recent Labs  Lab 05/22/23 0910  NA 136  K 4.0  CL 107  CO2 15*  BUN 20  CREATININE 2.06*  GLUCOSE 89  CALCIUM 9.3     Lipase 23 Respiratory panel negative Prothrombin time 17.6 INR 1.4 PTT 42 UA ketones, protein, rare bacteria  EKG: Regular rate and rhythm, P waves present before every QRS suggesting atrial rhythm,  no QRS prolongation, no QTc interval prolongation, no evidence of ST elevation.   3/18 CXR: Low lung volumes with mild bibasilar atelectasis.  No evidence of pneumonia.  3/18 CT head: 1. No acute intracranial abnormality. Chronic right cerebral hemiatrophy, stable since 2019. 2. Widespread paranasal sinus inflammation is new since that time. Consider Acute sinusitis.  3/18 CT A/P: 1. No acute intra-abdominal process.    Cyndia Skeeters, DO 05/22/2023, 4:59 PM PGY-1, [facility]  I was personally present and re-performed the exam. I verified the service and findings are accurately documented in the intern's note. Changes made within note.  Jerre Simon, MD 05/22/2023 5:34 PM   FPTS Intern pager: [phone], text pages welcome Secure chat group [facility]  Teaching Service

## 2023-05-22 NOTE — ED Provider Triage Note (Signed)
 Emergency Medicine Provider Triage Evaluation Note  Sean Kim , a 23 y.o. male  was evaluated in triage.  Pt complains of persistent nausea, vomiting, diarrhea, abdominal pain, and a syncopal episode yesterday.  Patient went to his endocrinologist today and was sent here for suspect adrenal crisis  Review of Systems  Positive: Nausea, vomiting, diarrhea, abdominal pain, cough, fatigue, syncope, headache, malaise Negative: Chest pain, shortness of breath, palpitations  Physical Exam  BP 93/63 (BP Location: Right Arm)   Pulse (!) 112   Temp 98 F (36.7 C)   Resp 18   SpO2 98%  Gen:   Awake, no distress  , soft pressures, tachycardia Resp:  Normal effort , breath sounds bilaterally MSK:   Moves extremities without difficulty  Other:  Patient has dry mucous membranes, abdomen is tender to palpation.  Breath sounds were clear  Medical Decision Making  Medically screening exam initiated at 9:14 AM.  Appropriate orders placed.  Shanti Ahlers was informed that the remainder of the evaluation will be completed by another provider, this initial triage assessment does not replace that evaluation, and the importance of remaining in the ED until their evaluation is complete.  Sean Kim is a 23 y.o. male with a past medical history significant for thyroid disease, pituitary hypoplasia, ADHD, seizures, previous stroke, MR, and previous encephalopathy who presents at the direction of his endocrinologist for suspected adrenal crisis which family said he was in the past.  They report that for the last 3 weeks or so patient has had persistent nausea, vomiting, diarrhea and has had abdominal pains.  Patient has been having severe discomfort and had a passing out episode yesterday in the bathroom where he may have hit his head.  They heard a thump on the ground.  Patient is complaining of some headache but is denying neck pain.  Is denying chest pain or shortness of breath but is having some cough and  congestion.  Abdomen is tender to palpation on exam.  Patient moving extremities.  Patient has dry mucous membranes.  Patient's vital signs reveal tachycardia and blood pressure was in the 90s initially.  He is afebrile.  Oxygen saturations are normal on room air initially.  Patient will have some screening labs and he will be taken back to a room quickly.  Anticipate he will need fluids and possibly steroids if this is adrenal crisis.  He will likely need imaging to rule out traumatic injuries from the syncopal episode and given the amount of abdominal tenderness he needed abdominal imaging as well.  Will likely chest x-ray given his cough.  Patient taken to an exam room to get seen by Dr. Theresia Lo for further workup.    Tula Schryver, Canary Brim, MD 05/22/23 [phone]

## 2023-05-22 NOTE — Assessment & Plan Note (Addendum)
 Suspect likely due to dehydration.  CT head negative. -Tylenol 650 mg q6 PRN

## 2023-05-22 NOTE — ED Triage Notes (Signed)
 Mother stated, he has been sick since Feb. 28 with N/V/D feeling weak and almost passed out yesterday. They said at his GI Dr. He had a type of gastritis but its more than that, he keeps being sick. He takes Zofran all the time.

## 2023-05-22 NOTE — Assessment & Plan Note (Addendum)
 Creatinine 2.06. Likely pre-renal 2/2 due to dehydration from N/V/D.  S/p 1 L IV fluid bolus in the ED. Hope to improve with fluid resuscitation. - Continue maintenance IV fluids and encourage good p.o. intake -- Avoid nephrotoxic agents - AM BMP to trend

## 2023-05-22 NOTE — Plan of Care (Addendum)
 FMTS Brief Progress Note  S:Evaluated pt at bedside for night rounds. Mom states that pt began complaining of headache and couldn't tell her where she is. He tells me he has a headache, no other complaints. Able to voice to me his name, DOB, why he is in hospital, the year, and describe things in the room such as his IV and white board.    O: BP 113/82 (BP Location: Left Arm)   Pulse 71   Temp 98.8 F (37.1 C) (Oral)   Resp 18   Ht 5\' 9"  (1.753 m)   Wt 93.4 kg   SpO2 100%   BMI 30.41 kg/m   Gen: resting comfortably, no acute distress CV: RRR, no murmurs Resp: breathing comfortably on RA, CTAB Neuro: Alert and oriented to person, time, situation, but not place. 4/5 symmetric strength BUE and BLE.  CN2: no vision changes CN3,4,6: PERRLA. EOMI CN5: Sensation intact BL CN7: Facial expressions symmetric CN8: Hearing intact BL CN9: palate symmetric  CN10: regular speech CN11: turns head against resistance CN12: tongue midline Bladder scan at bedside <139ml EKG: Prolonged Qtc at . Overall unchanged from prior earlier today - T wave inversions seen diffusely. No acute ST changes.   A/P: Headache Suspect headache in setting of dehydration. Do not suspect stroke as pt has normal neurological exam with no deficits. CT head WO contrast with no acute abnormality, but widespread paranasal sinus inflammation - Tylenol 650mg  for headache - will check CBG as pt's last metabolic stroke was due to hypoglycemia - Mom advised to let us know of any changes or worsening/persistent headache - Consider treatment for acute sinusitis if sx persist in AM  Long Qtc syndrome Prolonged Qtc at on EKG. - Removed Reglan for nausea, if pt develops more nausea/vomiting will consider other agent such as Tigan - Optimize electrolytes - am BMP, Mag - cardiac telemetry  Nausea, vomiting, abd pain Stable at this time. RUQ [place] completed and pending.   Rest of plan per day team  - Orders reviewed.  Labs for AM ordered, which was adjusted as needed.  - If condition changes, plan includes re-evaluation.   Shabazz Mckey, DO 05/22/2023, 10:10 PM PGY-1, [facility] Night Resident  Please page [phone] with questions.

## 2023-05-22 NOTE — Assessment & Plan Note (Signed)
 Last time patient had adrenal crisis he presented with the same symptoms.  S/p 100 mg IV Cortef bolus in the ED. - Continue with 50 mg IV Cortef q6 for 24 hours - Will obtain ACTH and plasma cortisol - AM BMP, ACTH, plasma cortisol to trend

## 2023-05-22 NOTE — ED Notes (Signed)
 Patient transported to CT

## 2023-05-22 NOTE — ED Notes (Signed)
 Pt rang out for urinal. Urinal given to pt.

## 2023-05-22 NOTE — Assessment & Plan Note (Addendum)
 Unclear origin of these symptoms which have been ongoing for 3-4 weeks. Does have concerns for dehydration given decreased urinary output, orthostatics and borderline hypotensive BP. - Admit to FMTS, attending Dr. Manson Passey -Med telemetry, Vital signs per floor -Follow-up blood cultures - GI stool panel for further infectious workup --Ordered C. Diff PCR (given watery diarrhea for over 5 days) - Zofran 4 mg q8 PRN -Holding Imodium at this time pending potential infectious workup - Maintenance IV fluids, Will give additional 1L bolus - AM BMP

## 2023-05-22 NOTE — Assessment & Plan Note (Addendum)
 Hypoglycemia-held home glucagon, CBG Q8H Hypothyroidism- Last TSH 2020. Ordered TSH. continued on home Synthroid 137 mcg daily

## 2023-05-22 NOTE — Care Plan (Addendum)
 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 Cardiology reviewed and  he did not qualify for ICD placement.  Reviewed EKG from today; showing negative T waves and prolonged QT interval. Ordered cardiac telemetry for the evening. Will repeat EKG, ordered.  Additionally, TSH elevated to 6.186. Ordered T4 lab.   Darral Dash, DO.

## 2023-05-22 NOTE — ED Provider Notes (Signed)
 [place] EMERGENCY DEPARTMENT AT [facility] Provider Note   CSN: [id number] Arrival date & time: 05/22/23  4742     History  Chief Complaint  Patient presents with   Abdominal Pain   Emesis   Headache   Weakness   Diarrhea   Nausea   Fever    Sean Kim is a 23 y.o. male.  Patient is a 23 year old male with a past medical history of hypopituitary, intellectual disability presenting to the emergency department with vomiting and diarrhea.  Patient is here with his mother who helps provide history.  She states that his symptoms initially started on February 28.  Was seen at [facility] ED and was diagnosed with gastroenteritis and symptoms improved with Zofran and Imodium.  She states that since then he has had continued symptoms coming and going.  He reports associated periumbilical abdominal pain.  She states that yesterday he became lightheaded and passed out at home.  He was seen by his endocrinologist this morning who is concerned that he was in adrenal crisis and recommended that he come to the ER for treatment.  She states that he did have a fever this morning.  He denies any cough.  She states that he has been complaining of headaches on and off and has had headaches since his syncopal episode yesterday.  He denies any other pain or injuries from the fall.  He denies any associated dysuria or hematuria.  His mother reports that he has been taking his medications inconsistently since the 28th due to vomiting and unable to keep the medications down.  The history is provided by the patient and a parent. The history is limited by a developmental delay.  Abdominal Pain Associated symptoms: diarrhea, fever and vomiting   Emesis Associated symptoms: abdominal pain, diarrhea, fever and headaches   Headache Associated symptoms: abdominal pain, diarrhea, fever, vomiting and weakness   Weakness Associated symptoms: abdominal pain, diarrhea, fever, headaches and vomiting    Diarrhea Associated symptoms: abdominal pain, fever, headaches and vomiting   Fever Associated symptoms: diarrhea, headaches and vomiting        Home Medications Prior to Admission medications   Medication Sig Start Date End Date Taking? Authorizing Provider  Glucagon, rDNA, (GLUCAGON EMERGENCY IJ) Inject 1 mg as directed as needed (blood sugar).    Yes [provider]  hydrocortisone (CORTEF) 10 MG tablet Take 10 mg by mouth daily. Take 30 mg by mouth as needed for sickness   Yes [provider]  ibuprofen (ADVIL) 200 MG tablet Take 600 mg by mouth every 6 (six) hours as needed for mild pain (pain score 1-3), fever or moderate pain (pain score 4-6).   Yes [provider]  levothyroxine (SYNTHROID) 137 MCG tablet Take 137 mcg by mouth every morning. 03/13/19  Yes [provider]  loperamide (IMODIUM) 2 MG capsule Take 1 capsule (2 mg total) by mouth as needed for diarrhea or loose stools. Patient taking differently: Take 2 mg by mouth 2 (two) times daily as needed for diarrhea or loose stools. 05/04/23  Yes Chesley Noon, MD  ondansetron (ZOFRAN-ODT) 4 MG disintegrating tablet Take 1 tablet (4 mg total) by mouth every 8 (eight) hours as needed for nausea or vomiting. 05/04/23  Yes Chesley Noon, MD  benzonatate (TESSALON PERLES) 100 MG capsule Take 1 capsule (100 mg total) by mouth 3 (three) times daily as needed. Patient not taking: Reported on 03/12/2023 02/04/23   Junie Spencer, FNP  Allergies    Patient has no known allergies.    Review of Systems   Review of Systems  Constitutional:  Positive for fever.  Gastrointestinal:  Positive for abdominal pain, diarrhea and vomiting.  Neurological:  Positive for weakness and headaches.    Physical Exam Updated Vital Signs BP 94/69   Pulse 88   Temp 98 F (36.7 C)   Resp 19   Ht 5\' 9"  (1.753 m)   Wt 93.4 kg   SpO2 99%   BMI 30.41 kg/m  Physical Exam Vitals and nursing note reviewed.   Constitutional:      General: He is not in acute distress.    Appearance: He is well-developed. He is ill-appearing.  HENT:     Head: Normocephalic and atraumatic.     Mouth/Throat:     Mouth: Mucous membranes are moist.     Pharynx: Oropharynx is clear.  Eyes:     Extraocular Movements: Extraocular movements intact.  Cardiovascular:     Rate and Rhythm: Regular rhythm. Tachycardia present.     Heart sounds: Normal heart sounds.  Pulmonary:     Effort: Pulmonary effort is normal.     Breath sounds: Normal breath sounds.  Abdominal:     General: Abdomen is flat.     Palpations: Abdomen is soft.     Tenderness: There is abdominal tenderness in the periumbilical area. There is no guarding or rebound.  Skin:    General: Skin is warm and dry.  Neurological:     General: No focal deficit present.     Mental Status: He is alert and oriented to person, place, and time.  Psychiatric:        Mood and Affect: Mood normal.        Behavior: Behavior normal.     ED Results / Procedures / Treatments   Labs (all labs ordered are listed, but only abnormal results are displayed) Labs Reviewed  COMPREHENSIVE METABOLIC PANEL - Abnormal; Notable for the following components:      Result Value   CO2 15 (*)    Creatinine, Ser 2.06 (*)    Total Protein 8.7 (*)    Total Bilirubin 1.5 (*)    GFR, Estimated 46 (*)    All other components within normal limits  CBC WITH DIFFERENTIAL/PLATELET - Abnormal; Notable for the following components:   RBC 4.14 (*)    Hemoglobin 11.4 (*)    HCT 36.7 (*)    All other components within normal limits  URINALYSIS, W/ REFLEX TO CULTURE (INFECTION SUSPECTED) - Abnormal; Notable for the following components:   APPearance HAZY (*)    Specific Gravity, Urine 1.045 (*)    Ketones, ur 20 (*)    Protein, ur 30 (*)    Bacteria, UA RARE (*)    All other components within normal limits  PROTIME-INR - Abnormal; Notable for the following components:   Prothrombin  Time 17.6 (*)    INR 1.4 (*)    All other components within normal limits  APTT - Abnormal; Notable for the following components:   aPTT 42 (*)    All other components within normal limits  CBG MONITORING, ED - Abnormal; Notable for the following components:   Glucose-Capillary 106 (*)    All other components within normal limits  RESP PANEL BY RT-PCR (RSV, FLU A&B, COVID)  RVPGX2  CULTURE, BLOOD (ROUTINE X 2)  CULTURE, BLOOD (ROUTINE X 2)  LIPASE, BLOOD  I-STAT CG4 LACTIC ACID, ED  EKG EKG Interpretation Date/Time:  Tuesday May 22 2023 11:39:38 EDT Ventricular Rate:  88 PR Interval:  157 QRS Duration:  125 QT Interval:  420 QTC Calculation: 509 R Axis:   68  Text Interpretation: Sinus rhythm Nonspecific intraventricular conduction delay Repol abnrm suggests ischemia, anterolateral No significant change since last tracing Confirmed by Elayne Snare ([id number]) on 05/22/2023 11:54:59 AM  Radiology CT ABDOMEN PELVIS W CONTRAST Result Date: 05/22/2023 CLINICAL DATA:  Abdominal pain with nausea, vomiting, and diarrhea for the past few weeks. EXAM: CT ABDOMEN AND PELVIS WITH CONTRAST TECHNIQUE: Multidetector CT imaging of the abdomen and pelvis was performed using the standard protocol following bolus administration of intravenous contrast. RADIATION DOSE REDUCTION: This exam was performed according to the departmental dose-optimization program which includes automated exposure control, adjustment of the mA and/or kV according to patient size and/or use of iterative reconstruction technique. CONTRAST:  75mL OMNIPAQUE IOHEXOL 350 MG/ML SOLN COMPARISON:  CT abdomen pelvis dated May 09, 2021. FINDINGS: Lower chest: No acute abnormality. Hepatobiliary: Unchanged focal fat along the falciform ligament. No new focal liver abnormality. Normal gallbladder. No biliary dilatation. Pancreas: Unremarkable. No pancreatic ductal dilatation or surrounding inflammatory changes. Spleen: Normal in size  without focal abnormality. Adrenals/Urinary Tract: Adrenal glands are unremarkable. Kidneys are normal, without renal calculi, focal lesion, or hydronephrosis. Bladder is unremarkable. Stomach/Bowel: Stomach is within normal limits. Appendix appears normal. No evidence of bowel wall thickening, distention, or inflammatory changes. Vascular/Lymphatic: No significant vascular findings are present. No enlarged abdominal or pelvic lymph nodes. Reproductive: Prostate is unremarkable. Other: No abdominal wall hernia or abnormality. No abdominopelvic ascites. Musculoskeletal: No acute or significant osseous findings. IMPRESSION: 1. No acute intra-abdominal process. Electronically Signed   By: Obie Dredge M.D.   On: 05/22/2023 14:25   CT Head Wo Contrast Result Date: 05/22/2023 CLINICAL DATA:  23 year old male with illness since late last month. Near syncope. Persistent nausea, abdominal pain. EXAM: CT HEAD WITHOUT CONTRAST TECHNIQUE: Contiguous axial images were obtained from the base of the skull through the vertex without intravenous contrast. RADIATION DOSE REDUCTION: This exam was performed according to the departmental dose-optimization program which includes automated exposure control, adjustment of the mA and/or kV according to patient size and/or use of iterative reconstruction technique. COMPARISON:  Head CT 08/02/2017. FINDINGS: Brain: Chronic asymmetric volume loss of the right cerebral hemisphere again noted, with mild ex vacuo enlargement of the right lateral ventricle. Subtle right brainstem volume loss also. Cerebellum remains symmetric. Appearance unchanged since 2019. No superimposed midline shift, ventriculomegaly, mass effect, evidence of mass lesion, intracranial hemorrhage or evidence of cortically based acute infarction. Gray-white matter differentiation is within normal limits throughout the brain. Vascular: No suspicious intracranial vascular hyperdensity. Skull: Stable and intact.  Sinuses/Orbits: Paranasal sinus mucosal thickening and opacification sparing the visible right maxillary sinus. Tympanic cavities and mastoids are well aerated. Evidence of some left maxillary sinus fluid. Other: Visualized orbits and scalp soft tissues are within normal limits. IMPRESSION: 1. No acute intracranial abnormality. Chronic right cerebral hemiatrophy, stable since 2019. 2. Widespread paranasal sinus inflammation is new since that time. Consider Acute sinusitis. Electronically Signed   By: Odessa Fleming M.D.   On: 05/22/2023 13:43   DG Chest 2 View Result Date: 05/22/2023 CLINICAL DATA:  Fever and chest pain. EXAM: CHEST - 2 VIEW COMPARISON:  Radiographs 05/09/2021 and 03/25/2021. FINDINGS: Persistent low lung volumes. The heart size and mediastinal contours are stable. There is stable mild atelectasis at both lung bases. No edema, confluent airspace disease,  pneumothorax or significant pleural effusion. The bones appear unremarkable. IMPRESSION: Low lung volumes with mild bibasilar atelectasis. No evidence of pneumonia. Electronically Signed   By: Carey Bullocks M.D.   On: 05/22/2023 10:50    Procedures .Critical Care  Performed by: Rexford Maus, DO Authorized by: Rexford Maus, DO   Critical care provider statement:    Critical care time (minutes):  30   Critical care was necessary to treat or prevent imminent or life-threatening deterioration of the following conditions:  Endocrine crisis   Critical care was time spent personally by me on the following activities:  Development of treatment plan with patient or surrogate, discussions with consultants, evaluation of patient's response to treatment, examination of patient, ordering and review of laboratory studies, ordering and review of radiographic studies, ordering and performing treatments and interventions, pulse oximetry, re-evaluation of patient's condition and review of old charts     Medications Ordered in  ED Medications  alum & mag hydroxide-simeth (MAALOX/MYLANTA) 200-200-20 MG/5ML suspension 30 mL (has no administration in time range)  ondansetron (ZOFRAN) injection 4 mg (4 mg Intravenous Given 05/22/23 1148)  famotidine (PEPCID) IVPB 20 mg premix (0 mg Intravenous Stopped 05/22/23 1219)  lactated ringers bolus 1,000 mL (1,000 mLs Intravenous New Bag/Given 05/22/23 1244)  hydrocortisone sodium succinate (SOLU-CORTEF) 100 MG injection 100 mg (100 mg Intravenous Given 05/22/23 1149)  iohexol (OMNIPAQUE) 350 MG/ML injection 75 mL (75 mLs Intravenous Contrast Given 05/22/23 1221)    ED Course/ Medical Decision Making/ A&P Clinical Course as of 05/22/23 1501  Tue May 22, 2023  1153 AKI with Cr 2.0 from baseline of normal, low bicarb consistent with dehydration. He does have normal sodium and potassium. [VK]  1154 CT pending at this time. [VK]  1459 No acute abnormality on CTAP,  no traumatic injury on CTH. ?sinusitis but no congestion making this less likely. Patient will require admission for AKI and possible adrenal crisis. [VK]    Clinical Course User Index [VK] Rexford Maus, DO                                 Medical Decision Making This patient presents to the ED with chief complaint(s) of N/V/D with pertinent past medical history of hypopituitarism, intellectual disability which further complicates the presenting complaint. The complaint involves an extensive differential diagnosis and also carries with it a high risk of complications and morbidity.    The differential diagnosis includes adrenal crisis, electrolyte abnormality, sepsis, viral syndrome, gastroenteritis, gastritis, GERD, pancreatitis, hepatitis, other intra-abdominal infection, considering ICH or mass effect from the syncope, arrhythmia, anemia  Additional history obtained: Additional history obtained from family Records reviewed endocrine records  ED Course and Reassessment: On patient's arrival he is mildly  tachycardic with soft blood pressures in the 90s though no acute distress.  Patient's mother reports he was febrile this morning and she did give him meds for his fever prior to arrival.  Patient initially had labs ordered in triage and will additionally have EKG, head CT and CT abdomen and pelvis as well as undergo possible sepsis workup.  He will be started on fluids and given Zofran and Pepcid for symptomatic management and will be given Solu-Medrol for presumed adrenal crisis and will be closely reassessed.  Independent labs interpretation:  The following labs were independently interpreted: AKI, low bicarb consistent with dehydration  Independent visualization of imaging: - I independently visualized the following imaging  with scope of interpretation limited to determining acute life threatening conditions related to emergency care: CTH, CTAP, which revealed no acute traumatic injury, no acute infectious process in the abdomen  Consultation: - Consulted or discussed management/test interpretation w/ external professional: family medicine  Consideration for admission or further workup: patient requires admission for AKI Social Determinants of health: N/A    Amount and/or Complexity of Data Reviewed Labs: ordered. Radiology: ordered.  Risk OTC drugs. Prescription drug management. Decision regarding hospitalization.          Final Clinical Impression(s) / ED Diagnoses Final diagnoses:  AKI (acute kidney injury) (HCC)  Nausea vomiting and diarrhea    Rx / DC Orders ED Discharge Orders     None         Rexford Maus, DO 05/22/23 1501

## 2023-05-22 NOTE — ED Notes (Signed)
 Unable to get labs ?

## 2023-05-22 NOTE — Plan of Care (Signed)

## 2023-05-22 NOTE — ED Notes (Signed)
Family medicine team at bedside.

## 2023-05-23 ENCOUNTER — Ambulatory Visit (HOSPITAL_COMMUNITY)

## 2023-05-23 ENCOUNTER — Inpatient Hospital Stay (HOSPITAL_COMMUNITY): Payer: MEDICAID

## 2023-05-23 DIAGNOSIS — E038 Other specified hypothyroidism: Secondary | ICD-10-CM

## 2023-05-23 DIAGNOSIS — R451 Restlessness and agitation: Secondary | ICD-10-CM | POA: Insufficient documentation

## 2023-05-23 DIAGNOSIS — R569 Unspecified convulsions: Secondary | ICD-10-CM

## 2023-05-23 DIAGNOSIS — I4581 Long QT syndrome: Secondary | ICD-10-CM | POA: Insufficient documentation

## 2023-05-23 DIAGNOSIS — J329 Chronic sinusitis, unspecified: Secondary | ICD-10-CM | POA: Insufficient documentation

## 2023-05-23 DIAGNOSIS — N179 Acute kidney failure, unspecified: Secondary | ICD-10-CM | POA: Diagnosis not present

## 2023-05-23 DIAGNOSIS — E272 Addisonian crisis: Secondary | ICD-10-CM

## 2023-05-23 HISTORY — DX: Restlessness and agitation: R45.1

## 2023-05-23 LAB — GLUCOSE, CAPILLARY: Glucose-Capillary: 125 mg/dL — ABNORMAL HIGH (ref 70–99)

## 2023-05-23 LAB — BASIC METABOLIC PANEL
Anion gap: 12 (ref 5–15)
BUN: 13 mg/dL (ref 6–20)
CO2: 23 mmol/L (ref 22–32)
Calcium: 9 mg/dL (ref 8.9–10.3)
Chloride: 104 mmol/L (ref 98–111)
Creatinine, Ser: 1.09 mg/dL (ref 0.61–1.24)
GFR, Estimated: >60 mL/min (ref >60)
Glucose, Bld: 167 mg/dL — ABNORMAL HIGH (ref 70–99)
Potassium: 4.5 mmol/L (ref 3.5–5.1)
Sodium: 139 mmol/L (ref 135–145)

## 2023-05-23 LAB — CBC
HCT: 30 % — ABNORMAL LOW (ref 39.0–52.0)
Hemoglobin: 9.8 g/dL — ABNORMAL LOW (ref 13.0–17.0)
MCH: 27.3 pg (ref 26.0–34.0)
MCHC: 32.7 g/dL (ref 30.0–36.0)
MCV: 83.6 fL (ref 80.0–100.0)
Platelets: 244 10*3/uL (ref 150–400)
RBC: 3.59 MIL/uL — ABNORMAL LOW (ref 4.22–5.81)
RDW: 14.6 % (ref 11.5–15.5)
WBC: 8 10*3/uL (ref 4.0–10.5)
nRBC: 0 % (ref 0.0–0.2)

## 2023-05-23 LAB — MAGNESIUM: Magnesium: 2.1 mg/dL (ref 1.7–2.4)

## 2023-05-23 LAB — HEPATIC FUNCTION PANEL
ALT: 14 U/L (ref 0–44)
AST: 28 U/L (ref 15–41)
Albumin: 3.7 g/dL (ref 3.5–5.0)
Alkaline Phosphatase: 52 U/L (ref 38–126)
Bilirubin, Direct: 0.2 mg/dL (ref 0.0–0.2)
Indirect Bilirubin: 0.5 mg/dL (ref 0.3–0.9)
Total Bilirubin: 0.7 mg/dL (ref 0.0–1.2)
Total Protein: 8.1 g/dL (ref 6.5–8.1)

## 2023-05-23 LAB — CORTISOL
Cortisol, Plasma: >100 ug/dL
Cortisol, Plasma: 59 ug/dL

## 2023-05-23 LAB — T4, FREE: Free T4: 0.36 ng/dL — ABNORMAL LOW (ref 0.61–1.12)

## 2023-05-23 LAB — ACTH: C206 ACTH: <1.5 pg/mL — ABNORMAL LOW (ref 7.2–63.3)

## 2023-05-23 MED ORDER — DOXYCYCLINE HYCLATE 100 MG PO TABS
100.0000 mg | ORAL_TABLET | Freq: Two times a day (BID) | ORAL | Status: DC
  Administered 2023-05-23 – 2023-05-25 (×5): 100 mg via ORAL
  Filled 2023-05-23 (×5): qty 1

## 2023-05-23 MED ORDER — FAMOTIDINE 20 MG PO TABS
20.0000 mg | ORAL_TABLET | Freq: Once | ORAL | Status: AC
  Administered 2023-05-23: 20 mg via ORAL
  Filled 2023-05-23: qty 1

## 2023-05-23 MED ORDER — FLUTICASONE PROPIONATE 50 MCG/ACT NA SUSP
2.0000 | Freq: Every day | NASAL | Status: DC
  Administered 2023-05-23 – 2023-05-25 (×3): 2 via NASAL
  Filled 2023-05-23: qty 16

## 2023-05-23 MED ORDER — LORAZEPAM 0.5 MG PO TABS
0.5000 mg | ORAL_TABLET | Freq: Once | ORAL | Status: AC
  Administered 2023-05-23: 0.5 mg via ORAL
  Filled 2023-05-23: qty 1

## 2023-05-23 MED ORDER — LACTATED RINGERS IV BOLUS
1000.0000 mL | Freq: Once | INTRAVENOUS | Status: AC
  Administered 2023-05-23: 1000 mL via INTRAVENOUS

## 2023-05-23 MED ORDER — AMOXICILLIN-POT CLAVULANATE 875-125 MG PO TABS: 1.0000 | ORAL_TABLET | Freq: Two times a day (BID) | ORAL | Status: DC

## 2023-05-23 NOTE — Procedures (Signed)
 Patient Name: Michelle Vanhise  MRN: [id number]  Epilepsy Attending: Charlsie Quest  Referring Physician/Provider: Westley Chandler, MD  Date: 05/23/2023 Duration: 23.33 mins  Patient history:22yo M. He had an episode of staring off and losing control of his bladder. EEG to evaluate for seizure  Level of alertness: Awake  AEDs during EEG study: None  Technical aspects: This EEG study was done with scalp electrodes positioned according to the 10-20 International system of electrode placement. Electrical activity was reviewed with band pass filter of 1-70Hz , sensitivity of 7 uV/mm, display speed of 38mm/sec with a 60Hz  notched filter applied as appropriate. EEG data were recorded continuously and digitally stored.  Video monitoring was available and reviewed as appropriate.  Description: EEG showed continuous generalized polymorphic 3 to 5 Hz theta-delta slowing. Physiologic photic driving was not seen during photic stimulation. Hyperventilation was not performed.     ABNORMALITY - Continuous slow, generalized  IMPRESSION: This study is suggestive of moderate diffuse encephalopathy. No seizures or epileptiform discharges were seen throughout the recording.  Mearl Harewood Annabelle Harman

## 2023-05-23 NOTE — Progress Notes (Signed)
EEG complete - results pending LTM to follow  

## 2023-05-23 NOTE — Progress Notes (Addendum)
 Daily Progress Note Intern Pager: [phone]  Patient name: Sean Kim Medical record number: [id number] Date of birth: 2000/12/10 Age: 23 y.o. Gender: male  Primary Care Provider: Levin Erp, MD Consultants: None Code Status: Full  Pt Overview and Major Events to Date:  3/18-admitted  Assessment and Plan: Sean Kim is a 23 year old male with complicated past medical history, presented for N/V/D suspected to be an adrenal crisis. Improving this morning.  Assessment & Plan Nausea vomiting and diarrhea No vomiting/diarrhea overnight.  Full respiratory panel negative.  RUQ ultrasound yesterday negative. S/p IVF bolus and maintenance fluids run overnight, however pt still appears dehydrated and phlebotomy having extreme difficulty obtaining labs. -Follow-up blood cultures - Zofran and Reglan held given QT prolongation as below - If nausea/vomiting recurs, consider Tigan -Holding Imodium at this time pending potential infectious workup - Give additional 1L IVF bolus; consider restarting maintenance IV fluids afterwards -Encourage good p.o. intake - AM BMP Adrenal crisis (HCC) Cortisol obtained yesterday on admission 96.8.  AM cortisol >100 drawn at 6AM.  Less likely true adrenal crisis given cortisol levels.  - Continue with 50 mg IV Cortef q6 for 24 hours - Will discuss with pt's primary endocrinologist, Dr. Sharl Ma; will need appropriate steroid taper vs returning to home dosing Cortef Abdominal pain RUQ ultrasound negative.  Patient reports improvement this morning. AKI (acute kidney injury) (HCC) Resolved this morning, creatinine 1.09. -Giving additional 1L bolus as above -- Avoid nephrotoxic agents - AM BMP Headache Patient reports ongoing this morning, but not severe.  He did receive Tylenol last night, but did not want any this morning. -Tylenol 650 mg q6 PRN  Agitation Patient was quite agitated overnight requiring Ativan.  Question if this was related to not  being in his home environment; mom denies baseline agitation at home. - PRN ativan overnight for agitation Long QT syndrome Repeat EKG yesterday showed QTc at . -Avoid QTc prolonging medications were possible - Continue to monitor electrolytes with AM, BMP, Mag - Continue cardiac telemetry Sinusitis Seen on imaging. - Start Flonase - Doxycycline 100 mg twice daily x 5 days Chronic health problem Hypoglycemia-held home glucagon, CBG Q8H Central hypothyroidism-TSH elevated, T4 low at 0.36. Discuss with pt's primary endocrinologist Dr. Sharl Ma. H/o Seizures: mom concerned for possible seizure episode overnight with staring, then agitation. Will obtain EEG, consider consulting neurology.   FEN/GI: Regular diet PPx: Lovenox Dispo:Home pending clinical improvement . Barriers include clinical improvement.   Subjective:  Patient seen this morning sitting up in bed with phlebotomy trying to draw labs.  Mom agrees that he was agitated overnight and is concerned that he may have had a seizure as he had an episode of staring off and losing control of his bladder.  Afterwards he started crying and was very agitated. This morning he talks to me and tells me he has a headache but no belly pain. Gives me a fist bump.  Is able to stand up and stand at bedside to stretch.  Objective: Temp:  [97.4 F (36.3 C)-98.8 F (37.1 C)] 97.4 F (36.3 C) (03/19 0507) Pulse Rate:  [71-120] 71 (03/19 0507) Resp:  [15-23] 18 (03/19 0507) BP: (91-113)/(62-82) 108/76 (03/19 0507) SpO2:  [90 %-100 %] 100 % (03/19 0507) Weight:  [93.4 kg] 93.4 kg (03/18 0937) Physical Exam: General: Well-appearing, calm, NAD Cardiovascular: RRR, no murmurs Respiratory: CTA bilaterally, no increased work of breathing on room air Abdomen: Normoactive bowel sounds, soft, nontender to palpation Extremities: Capillary refill >2 seconds. Moves  all extremities equally.   Laboratory: Most recent CBC Lab Results  Component Value  Date   WBC 8.0 05/23/2023   HGB 9.8 (L) 05/23/2023   HCT 30.0 (L) 05/23/2023   MCV 83.6 05/23/2023   PLT 244 05/23/2023   Most recent BMP    Latest Ref Rng & Units 05/23/2023    6:09 AM  BMP  Glucose 70 - 99 mg/dL 782   BUN 6 - 20 mg/dL 13   Creatinine 9.56 - 1.24 mg/dL 2.13   Sodium 086 - 578 mmol/L 139   Potassium 3.5 - 5.1 mmol/L 4.5   Chloride 98 - 111 mmol/L 104   CO2 22 - 32 mmol/L 23   Calcium 8.9 - 10.3 mg/dL 9.0    Museum 2.1 Free T4 0.36 Plasma cortisol >100 at 6AM  Cyndia Skeeters, DO 05/23/2023, 7:27 AM  PGY-1, [facility] FPTS Intern pager: [phone], text pages welcome Secure chat group [facility] Teaching Service

## 2023-05-23 NOTE — Care Plan (Signed)
 Patient sobbing at bedside.  Ripped out his IV overnight.  Stepmom with concern for seizure-like activity (says that he stares off into the distance, getting stiff).   He is not currently showing any signs of generalized seizure. He is sitting upright in the bed crying.  Stepmom said he pulled off his cardiac tele all night long.  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  PLAN: POC CBG STAT Administer Ativan 0.5 mg oral tablet x1 IV team to replace IV.  Darral Dash, DO PGY-3 [facility]

## 2023-05-23 NOTE — Plan of Care (Signed)
 Called on-call Neurologist to discuss mother's concerns about possibility of absence seizures overnight.  Unclear picture.  Neurology ordered overnight EEG.  Request call back tomorrow AM after official read back.  No AEDs at this time.  Call if status epilepticus concern.

## 2023-05-23 NOTE — Assessment & Plan Note (Addendum)
 Hypoglycemia-held home glucagon, CBG Q8H Central hypothyroidism-TSH elevated, T4 low at 0.36. Discuss with pt's primary endocrinologist Dr. Sharl Ma. H/o Seizures: mom concerned for possible seizure episode overnight with staring, then agitation. Will obtain EEG, consider consulting neurology.

## 2023-05-23 NOTE — Assessment & Plan Note (Addendum)
 Patient was quite agitated overnight requiring Ativan.  Question if this was related to not being in his home environment; mom denies baseline agitation at home. - PRN ativan overnight for agitation

## 2023-05-23 NOTE — Assessment & Plan Note (Signed)
 Repeat EKG yesterday showed QTc at . -Avoid QTc prolonging medications were possible - Continue to monitor electrolytes with AM, BMP, Mag - Continue cardiac telemetry

## 2023-05-23 NOTE — Assessment & Plan Note (Addendum)
 Patient reports ongoing this morning, but not severe.  He did receive Tylenol last night, but did not want any this morning. -Tylenol 650 mg q6 PRN

## 2023-05-23 NOTE — Plan of Care (Signed)
 Spoke with patient's outpatient endocrinologist Dr. Talmage Coin who the patient was last seen in his clinic Jul 18, 2021.  In agreement with patient's mother who reported noncompliance from  patient with his medication.  Dr. Sharl Ma recommend tripling home steroid dose for at least 5 days after completing current inpatient regime and recommending follow-up with endocrinologist as soon as possible.  He also recommend restarting home Synthroid at given suspected hypothyroid lab finding to be due to noncompliance.  The inpatient team will make sure mother calls to schedule appointment with endocrinologist prior to discharge for close follow-up.

## 2023-05-23 NOTE — Assessment & Plan Note (Addendum)
 RUQ ultrasound negative.  Patient reports improvement this morning.

## 2023-05-23 NOTE — Assessment & Plan Note (Addendum)
 No vomiting/diarrhea overnight.  Full respiratory panel negative.  RUQ ultrasound yesterday negative. S/p IVF bolus and maintenance fluids run overnight, however pt still appears dehydrated and phlebotomy having extreme difficulty obtaining labs. -Follow-up blood cultures - Zofran and Reglan held given QT prolongation as below - If nausea/vomiting recurs, consider Tigan -Holding Imodium at this time pending potential infectious workup - Give additional 1L IVF bolus; consider restarting maintenance IV fluids afterwards -Encourage good p.o. intake - AM BMP

## 2023-05-23 NOTE — Assessment & Plan Note (Addendum)
 Resolved this morning, creatinine 1.09. -Giving additional 1L bolus as above -- Avoid nephrotoxic agents - AM BMP

## 2023-05-23 NOTE — Plan of Care (Signed)
 Received page from RN that pt was complaining of abdominal pain and decreased urinary output today.  Evaluated pt at bedside with Dr. Barb Merino.  Pt states his abdomen hurts. Mom states his last BM was diarrhea when he was admitted. States he ate variety of food today including spaghetti and sandwich.  Abdominal exam: Normal bowel sounds, soft, non-tender to deep palpation in all quadrants.  Suspect his stomach may be upset from something he ate today. No concern for acute changes in abdomen. RUQ [place] yesterday and CT A/P yesterday reassuringly unremarkable. Will try famotidine 20mg  x1.  Bladder scan at 2100 with - no concern for urinary retention.

## 2023-05-23 NOTE — Assessment & Plan Note (Addendum)
 Cortisol obtained yesterday on admission 96.8.  AM cortisol >100 drawn at 6AM.  Less likely true adrenal crisis given cortisol levels.  - Continue with 50 mg IV Cortef q6 for 24 hours - Will discuss with pt's primary endocrinologist, Dr. Sharl Ma; will need appropriate steroid taper vs returning to home dosing Cortef

## 2023-05-23 NOTE — Progress Notes (Signed)
Pt back on tele monitor.

## 2023-05-23 NOTE — Progress Notes (Signed)
 LTM EEG hooked up and running - no initial skin breakdown - push button tested - [facility] monitoring. CT leads used. Head wrapped due to pt already trying to remove leads before tech was done with setup.

## 2023-05-23 NOTE — Assessment & Plan Note (Addendum)
 Seen on imaging. - Start Flonase - Doxycycline 100 mg twice daily x 5 days

## 2023-05-24 ENCOUNTER — Inpatient Hospital Stay (HOSPITAL_COMMUNITY): Payer: MEDICAID

## 2023-05-24 DIAGNOSIS — R410 Disorientation, unspecified: Secondary | ICD-10-CM | POA: Diagnosis not present

## 2023-05-24 DIAGNOSIS — R569 Unspecified convulsions: Secondary | ICD-10-CM

## 2023-05-24 DIAGNOSIS — E272 Addisonian crisis: Secondary | ICD-10-CM | POA: Diagnosis not present

## 2023-05-24 HISTORY — DX: Unspecified convulsions: R56.9

## 2023-05-24 LAB — CBC
HCT: 29.5 % — ABNORMAL LOW (ref 39.0–52.0)
Hemoglobin: 9.5 g/dL — ABNORMAL LOW (ref 13.0–17.0)
MCH: 27.2 pg (ref 26.0–34.0)
MCHC: 32.2 g/dL (ref 30.0–36.0)
MCV: 84.5 fL (ref 80.0–100.0)
Platelets: 233 10*3/uL (ref 150–400)
RBC: 3.49 MIL/uL — ABNORMAL LOW (ref 4.22–5.81)
RDW: 14.9 % (ref 11.5–15.5)
WBC: 5 10*3/uL (ref 4.0–10.5)
nRBC: 0 % (ref 0.0–0.2)

## 2023-05-24 LAB — BASIC METABOLIC PANEL
Anion gap: 10 (ref 5–15)
BUN: 12 mg/dL (ref 6–20)
CO2: 23 mmol/L (ref 22–32)
Calcium: 8.7 mg/dL — ABNORMAL LOW (ref 8.9–10.3)
Chloride: 105 mmol/L (ref 98–111)
Creatinine, Ser: 0.85 mg/dL (ref 0.61–1.24)
GFR, Estimated: >60 mL/min (ref >60)
Glucose, Bld: 154 mg/dL — ABNORMAL HIGH (ref 70–99)
Potassium: 3.3 mmol/L — ABNORMAL LOW (ref 3.5–5.1)
Sodium: 138 mmol/L (ref 135–145)

## 2023-05-24 LAB — ACTH: C206 ACTH: <1.5 pg/mL — ABNORMAL LOW (ref 7.2–63.3)

## 2023-05-24 LAB — GLUCOSE, CAPILLARY
Glucose-Capillary: 100 mg/dL — ABNORMAL HIGH (ref 70–99)
Glucose-Capillary: 138 mg/dL — ABNORMAL HIGH (ref 70–99)

## 2023-05-24 MED ORDER — POTASSIUM CHLORIDE CRYS ER 20 MEQ PO TBCR
60.0000 meq | EXTENDED_RELEASE_TABLET | Freq: Once | ORAL | Status: AC
  Administered 2023-05-25: 60 meq via ORAL
  Filled 2023-05-24: qty 3

## 2023-05-24 MED ORDER — HYDROCORTISONE 20 MG PO TABS
30.0000 mg | ORAL_TABLET | Freq: Every day | ORAL | Status: DC
  Administered 2023-05-24 – 2023-05-25 (×2): 30 mg via ORAL
  Filled 2023-05-24 (×2): qty 1

## 2023-05-24 NOTE — Progress Notes (Signed)
   05/24/23 1605  TOC Brief Assessment  Insurance and Status Reviewed Herbalist)  Patient has primary care physician Yes Levin Erp MD)  Home environment has been reviewed lives with Mom-  Prior level of function: No longer going to day program. Mom and siblings are caregivers  Prior/Current Home Services  (Unsure- Mom's number not is service)  Social Drivers of Health Review SDOH reviewed needs interventions;SDOH reviewed no interventions necessary  Readmission risk has been reviewed Yes (9%)  Transition of care needs no transition of care needs at this time   No TOC needs identified at this time. Please place [facility]  consult should needs arise.

## 2023-05-24 NOTE — Progress Notes (Signed)
 vLTM maintenance  All impedances below 10k.  No skin breakdown noted at  FP1  FP2   F4  F3  FZ

## 2023-05-24 NOTE — Progress Notes (Signed)
 Daily Progress Note Intern Pager: [phone]  Patient name: Sean Kim Medical record number: [id number] Date of birth: January 07, 2001 Age: 23 y.o. Gender: male  Primary Care Provider: Levin Erp, MD Consultants: Neurology (s/o) Code Status: Full  Pt Overview and Major Events to Date:  3/18-admitted  Assessment and Plan: Sean Kim is a 23 year old male with complicated past medical history, presented for N/V/D suspected to be an adrenal crisis. Improving this morning.  Assessment & Plan Nausea vomiting and diarrhea No vomiting/diarrhea overnight, though did complain of abdominal pain.  No difficulties with PO yesterday.  -Follow-up blood cultures - Zofran and Reglan held given QT prolongation as below - If nausea/vomiting recurs, consider Tigan -Encourage good p.o. intake - AM BMP Adrenal crisis (HCC) Please see plan of care note from Dr. Elliot Gurney 3/19 for recommendations from patient's primary endocrinologist.  Patient has completed 50 mg IV Cortef q6 for 24h - adjust steroid regimen as below. -Per Dr. Sharl Ma will triple home steroid dose for 5 days: Cortef 30mg  x5d - Needs close endocrinology follow-up outpatient Seizure-like activity (HCC) No seizure-like activity overnight last night.  Neurology consulted with recommendations as below; now signed off. - Overnight EEG last night with moderate diffuse encephalopathy, but no seizures noted - Neurology does not suspect seizures, more suspicious for element of delirium.  Recommend avoiding benzodiazepines for agitation due to QTc; consider olanzapine instead. 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  Please see plan of care note from Dr. Elliot Gurney 3/19 for recommendations from patient's primary endocrinologist. - Restart home Synthroid at 130 mcg; suspect hypothyroid lab findings due to medication noncompliance in the setting  of episodes over the last month Abdominal pain Did complain of abdominal pain overnight, though exam as reported by night team was benign.  Did have a good appetite yesterday and ate a variety of foods. Headache Patient reports ongoing this morning, but not severe.  Received Tylenol. -Continue Tylenol 650 mg q6 PRN  Agitation No agitation reported overnight.  Patient slept well according to mom. - Per neurology note, avoid benzodiazepines for agitation due to QTc prolongation; consider olanzapine instead. Long QT syndrome Repeat EKG this AM with Qtc prolongation similar to prior. -Avoid QTc prolonging medications were possible - Continue to monitor electrolytes with AM, BMP, Mag - Continue cardiac telemetry - K > 4, Mag >2 in the setting of prolonged QTc Sinusitis Seen on imaging. - Flonase - Doxycycline 100 mg twice daily x 5 days (3/19 - 3/23) Chronic health problem Hypoglycemia-held home glucagon, CBG Q8H Central hypothyroidism-TSH elevated, T4 low at 0.36. Discuss with pt's primary endocrinologist Dr. Sharl Ma. H/o Seizures: mom concerned for possible seizure episode overnight with staring, then agitation. Will obtain EEG, consider consulting neurology.   FEN/GI: Regular diet PPx: Lovenox Dispo:Home pending clinical improvement . Barriers include clinical stability.   Subjective:  Patient seen this morning sitting up in bed eating breakfast with mom at bedside.  Mom says he did well overnight and was able to get some sleep.  He reports mild headache this morning and requests Tylenol.  Objective: Temp:  [97.3 F (36.3 C)-98.7 F (37.1 C)] 98.1 F (36.7 C) (03/20 0408) Pulse Rate:  [59-97] 59 (03/20 0408) Resp:  [18-19] 18 (03/20 0408) BP: (96-118)/(60-83) 96/60 (03/20 0408) SpO2:  [91 %-100 %] 100 % (03/20 0408) Physical Exam: General: Well-appearing, eating breakfast, NAD Cardiovascular: RRR Respiratory: Normal work of breathing  on room air Abdomen: Soft, nontender,  nondistended Neuro: CN II: PERRL CN III, IV,VI: EOMI CVII: Symmetric smile and brow raise CN VIII: Normal hearing CN IX,X: Symmetric palate raise  CN XI: 5/5 shoulder shrug CN XII: Symmetric tongue protrusion  UE and LE strength 5/5 Normal sensation in UE and LE bilaterally, with exception of mildly decreased sensation on dorsal aspect of left foot  Laboratory: Most recent CBC Lab Results  Component Value Date   WBC 8.0 05/23/2023   HGB 9.8 (L) 05/23/2023   HCT 30.0 (L) 05/23/2023   MCV 83.6 05/23/2023   PLT 244 05/23/2023   Most recent BMP    Latest Ref Rng & Units 05/23/2023    6:09 AM  BMP  Glucose 70 - 99 mg/dL 161   BUN 6 - 20 mg/dL 13   Creatinine 0.96 - 1.24 mg/dL 0.45   Sodium 409 - 811 mmol/L 139   Potassium 3.5 - 5.1 mmol/L 4.5   Chloride 98 - 111 mmol/L 104   CO2 22 - 32 mmol/L 23   Calcium 8.9 - 10.3 mg/dL 9.0     Sean Skeeters, DO 05/24/2023, 7:42 AM  PGY-1, [facility] FPTS Intern pager: [phone], text pages welcome Secure chat group [facility] Teaching Service

## 2023-05-24 NOTE — Assessment & Plan Note (Addendum)
 Did complain of abdominal pain overnight, though exam as reported by night team was benign.  Did have a good appetite yesterday and ate a variety of foods.

## 2023-05-24 NOTE — Assessment & Plan Note (Addendum)
 No seizure-like activity overnight last night.  Neurology consulted with recommendations as below; now signed off. - Overnight EEG last night with moderate diffuse encephalopathy, but no seizures noted - Neurology does not suspect seizures, more suspicious for element of delirium.  Recommend avoiding benzodiazepines for agitation due to QTc; consider olanzapine instead. - Recommend maintaining K > 4, Mag >2 in the setting of prolonged QTc

## 2023-05-24 NOTE — Assessment & Plan Note (Addendum)
 Seen on imaging. - Flonase - Doxycycline 100 mg twice daily x 5 days (3/19 - 3/23)

## 2023-05-24 NOTE — Assessment & Plan Note (Deleted)
***   Resolved this morning, creatinine 1.09. -Giving additional 1L bolus as above -- Avoid nephrotoxic agents - AM BMP

## 2023-05-24 NOTE — Procedures (Signed)
 Patient Name: Sean Kim  MRN: [id number]  Epilepsy Attending: Charlsie Quest  Referring Physician/Provider: Caryl Pina, MD  Duration: 05/23/2023 1646 to 05/24/2023 1646   Patient history:22yo M. He had an episode of staring off and losing control of his bladder. EEG to evaluate for seizure   Level of alertness: Awake, asleep   AEDs during EEG study: None   Technical aspects: This EEG study was done with scalp electrodes positioned according to the 10-20 International system of electrode placement. Electrical activity was reviewed with band pass filter of 1-70Hz , sensitivity of 7 uV/mm, display speed of 23mm/sec with a 60Hz  notched filter applied as appropriate. EEG data were recorded continuously and digitally stored.  Video monitoring was available and reviewed as appropriate.   Description: EEG showed continuous generalized polymorphic 3 to 5 Hz theta-delta slowing. Sleep was characterized by sleep spindles (12-14Hz ), maximal fronto-central region.   ABNORMALITY - Continuous slow, generalized   IMPRESSION: This study is suggestive of moderate diffuse encephalopathy. No seizures or epileptiform discharges were seen throughout the recording.   Elle Vezina Annabelle Harman

## 2023-05-24 NOTE — Progress Notes (Signed)
 LTM EEG discontinued - no skin breakdown at [facility].

## 2023-05-24 NOTE — Plan of Care (Signed)

## 2023-05-24 NOTE — Assessment & Plan Note (Addendum)
 Repeat EKG this AM with Qtc prolongation similar to prior. -Avoid QTc prolonging medications were possible - Continue to monitor electrolytes with AM, BMP, Mag - Continue cardiac telemetry - K > 4, Mag >2 in the setting of prolonged QTc

## 2023-05-24 NOTE — Assessment & Plan Note (Addendum)
 Patient reports ongoing this morning, but not severe.  Received Tylenol. -Continue Tylenol 650 mg q6 PRN

## 2023-05-24 NOTE — Assessment & Plan Note (Addendum)
 No agitation reported overnight.  Patient slept well according to mom. - Per neurology note, avoid benzodiazepines for agitation due to QTc prolongation; consider olanzapine instead.

## 2023-05-24 NOTE — Assessment & Plan Note (Addendum)
 Please see plan of care note from Dr. Elliot Gurney 3/19 for recommendations from patient's primary endocrinologist.  Patient has completed 50 mg IV Cortef q6 for 24h - adjust steroid regimen as below. -Per Dr. Sharl Ma will triple home steroid dose for 5 days: Cortef 30mg  x5d - Needs close endocrinology follow-up outpatient

## 2023-05-24 NOTE — Assessment & Plan Note (Signed)
 This is a longstanding problem for this patient.  Please see plan of care note from Dr. Elliot Gurney 3/19 for recommendations from patient's primary endocrinologist. - Restart home Synthroid at 130 mcg; suspect hypothyroid lab findings due to medication noncompliance in the setting of episodes over the last month

## 2023-05-24 NOTE — Consult Note (Addendum)
 NEUROLOGY CONSULT NOTE   Date of service: May 24, 2023 Patient Name: Sean Kim MRN:  [id number] DOB:  2001/02/25 Chief Complaint: "Absence seizure" Requesting Provider: Westley Chandler, MD  History of Present Illness  Sean Kim is a 23 y.o. male with hx of intellectual disability, panhypopituitarism, central hypothyroidism, adrenal insufficiency, ADHD and a remote history of metabolic stroke x1, who presented 3/19 with a three week history of nausea and vomiting, found to have pre-renal AKI and to be in adrenal crisis. Came to the [facility] ED after unwitnessed fall with possible head strike, no shaking/seizure-like activity noticed.   Per a 2019 pediatric neurology note by Dr. 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 He was reportedly behaviorally and academically normal until he experienced nausea, vomiting and diarrhea with high fever (Strep A+) and then underwent a "prolonged seizure-like event" involving "jerky movements" of upper and lower extremities lasting ~24 hours in June of 2014 in the setting of severe hypoglycemia (to 10 mg/dl, per fingerstick sample). This caused diffuse right brain injury requiring ICU care. Prior to this, patient had been speaking in normal sentences and had no issues with dressing. Afterwards, he exhibited significant and permanent intellectual deficits. This has been described on imaging as "diffuse cortical and subcortical atrophy, hydrocephalus ex vacuo and wallerian degeneration." It was Dr. Darl Householder opinion that the injury to patient's right brain was likely metabolic, and not due to an ischemic event or solely due to prolonged seizure. In subsequent episodes of hypoglycemia secondary to hypopituitarism, he has developed transient left-sided weakness. Previous EEG in 2019 have shown right sided slowing consistent with injury but no evidence of epileptiform activity -- most recent rEEG during this hospitalization 05/23/2023  showed moderate diffuse encephalopathy but no epileptiform discharges or seizures. Neurology was consulted with concern for absence seizure.   On interview with step-mother in room, she has reported staring spells (typified by staring and upper body tensing) during this hospitalization. Of note, no seizure or epileptiform activity were caught on rEEG yesterday or LTM EEG overnight. He has had these staring spells since [age] years old ("all the time") but less so since after the stroke-like event precipitated by severe hypoglycemia at age 55 ("once in a while".) Has been prescribed AED from ages 73 - 30 but is stepmother is unable to remember which AED(s) specifically. Did not note any side effect from AED medication. When hospitalized in 2019 for hypoglycemic episode, Dr. Sharene Skeans did not capture absence seizure on EEG and patient was not placed back on AED at that time. Step-mother is concerned this hospitalization for behavioral dysregulation, pulling IVs, leaving his room and hallucinations of dead people, which scares him. He has never had hallucinations before. Patient has also had the sense that "the left side is not feeling like the right side." Notes depression in the past. Patient has finished HS with a diploma, but does not currently work.    On interview with patient, he is oriented to self and year but not to place or reason for hospitalization. Can follow verbal commands. 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 Also notes +3 left patellar reflex. All other exam findings are within normal limits except for attention and concentration deficits, poor orientation, being somewhat withdrawn and with evidence of decreased complexity of his thought content.     ROS  Comprehensive ROS performed and pertinent positives documented in HPI   Past  History   Past Medical History:  Diagnosis Date    ADHD    COVID-19 virus infection    Hypoglycemia    MR (mental retardation)    Pituitary abnormality (HCC)    Seizures (HCC)    Stroke (HCC)    Thyroid disease     Past Surgical History:  Procedure Laterality Date   EYE SURGERY     x2   TONSILLECTOMY      Family History: Family History  Problem Relation Age of Onset   Healthy Mother    Hypertension Maternal Grandmother     Social History  reports that he has never smoked. He has never used smokeless tobacco. He reports that he does not drink alcohol and does not use drugs.  No Known Allergies  Medications   Current Facility-Administered Medications:    acetaminophen (TYLENOL) tablet 650 mg, 650 mg, Oral, Q6H PRN, 650 mg at 05/23/23 1730 **OR** acetaminophen (TYLENOL) suppository 650 mg, 650 mg, Rectal, Q6H PRN, Spence, Sarah, DO   doxycycline (VIBRA-TABS) tablet 100 mg, 100 mg, Oral, Q12H, Spence, Sarah, DO, 100 mg at 05/23/23 2133   enoxaparin (LOVENOX) injection 40 mg, 40 mg, Subcutaneous, Q24H, Spence, Sarah, DO, 40 mg at 05/22/23 2017   fluticasone (FLONASE) 50 MCG/ACT nasal spray 2 spray, 2 spray, Each Nare, Daily, Shitarev, Dimitry, MD, 2 spray at 05/23/23 1211   levothyroxine (SYNTHROID) tablet 137 mcg, 137 mcg, Oral, Q0600, Cyndia Skeeters, DO, 137 mcg at 05/24/23 0523  Vitals   Vitals:   05/23/23 2010 05/24/23 0049 05/24/23 0408 05/24/23 0813  BP: 110/80 102/66 96/60 101/66  Pulse: 75 74 (!) 59 69  Resp: 18 18 18 19   Temp: 97.9 F (36.6 C) 98.7 F (37.1 C) 98.1 F (36.7 C)   TempSrc: Oral Oral Oral   SpO2: 91% 99% 100% 100%  Weight:      Height:        Body mass index is 30.41 kg/m.  Physical Exam   Constitutional: Appears well-developed and well-nourished. Young-appearing. In no acute distress.  Psych: Affect appropriate to situation.  Eyes: No scleral injection.  HENT: No OP obstruction.  Head: Normocephalic.  Respiratory: Effort normal, non-labored breathing.   Neurologic Examination    Mental Status -patient largely alert and oriented to name and month, however variably oriented to year open parent "2015 ? 2025").  Believes he is in [facility], patient reoriented.  Language intact including expression, naming, repetition and comprehension.  Minor attention deficit, unclear if related to baseline cognitive deficits. WORLD intact, but not backwards ("DLROW").  Calculation impaired.  Abstraction intact.  Cranial Nerves II - XII - II - Visual fields intact. III, IV, VI - Notable for lateral rectus paresis left eye, baseline.  Extraocular movements are otherwise full. V - Facial sensation intact bilaterally. VII - Facial movement intact bilaterally. VIII - Hearing & vestibular intact bilaterally. X - Palate elevates symmetrically. XI - Chin turning & shoulder shrug intact bilaterally. XII - Tongue protrusion intact.  Motor Strength - The patient's strength was normal in all extremities and pronator drift was absent.  Bulk was normal and fasciculations were absent.  Fine motor intact bilaterally.  Motor Tone - Muscle tone was assessed at the neck and appendages and was normal.  Reflexes - Hyperreflexive +3 left patellar reflex. The patient's reflexes were normal and symmetrical in all other extremities.  Babinski negative bilaterally.  No clonus could be elicited. Hoffmann's sign negative bilaterally.   Sensory - Light touch was assessed  and was symmetrical.    Coordination - The patient had normal movements in the hands and feet with no ataxia or dysmetria.  Finger-nose-finger intact.  Tremor was absent.  Gait and Station - deferred.   Labs/Imaging/Neurodiagnostic studies   CBC:  Recent Labs  Lab May 29, 2023 0910 2023/05/29 1837 05/23/23 0609  WBC 8.2 6.8 8.0  NEUTROABS 4.7  --   --   HGB 11.4* 9.3* 9.8*  HCT 36.7* 29.3* 30.0*  MCV 88.6 85.9 83.6  PLT PLATELET CLUMPS NOTED ON SMEAR, UNABLE TO ESTIMATE 216 244   Basic Metabolic Panel:  Lab Results   Component Value Date   NA 139 05/23/2023   K 4.5 05/23/2023   CO2 23 05/23/2023   GLUCOSE 167 (H) 05/23/2023   BUN 13 05/23/2023   CREATININE 1.09 05/23/2023   CALCIUM 9.0 05/23/2023   GFRNONAA >60 05/23/2023   GFRAA >60 11/08/2018   Lipid Panel: No results found for: "LDLCALC" HgbA1c: No results found for: "HGBA1C" Urine Drug Screen:     Component Value Date/Time   LABOPIA NONE DETECTED 07/23/2017 1708   COCAINSCRNUR NONE DETECTED 07/23/2017 1708   LABBENZ NONE DETECTED 07/23/2017 1708   AMPHETMU NONE DETECTED 07/23/2017 1708   THCU NONE DETECTED 07/23/2017 1708   LABBARB NONE DETECTED 07/23/2017 1708    Alcohol Level No results found for: "ETH" INR  Lab Results  Component Value Date   INR 1.4 (H) 2023-05-29   APTT  Lab Results  Component Value Date   APTT 42 (H) 05-29-23   AED levels: No results found for: "PHENYTOIN", "ZONISAMIDE", "LAMOTRIGINE", "LEVETIRACETA"  CT Head without contrast (Personally reviewed): 1. No acute intracranial abnormality. Chronic right cerebral hemiatrophy, stable since 2019. 2. Widespread paranasal sinus inflammation is new since that time. Consider Acute sinusitis.  Neurodiagnostics rEEG (3/19): This study is suggestive of moderate diffuse encephalopathy. No seizures or epileptiform discharges were seen throughout the recording.   ASSESSMENT  Sean Kim is a 23 y.o. male with a hx of intellectual disability, panhypopituitarism, central hypothyroidism, adrenal insufficiency, ADHD and a remote history of metabolic stroke x1, who presented 3/19 with a three week history of nausea, vomiting found to have pre-renal AKI and to be in adrenal crisis. Came to [facility] ED after unwitnessed fall with possible head strike, no shaking/seizure-like activity noticed. Per step-mother, patient has been having episodes of staring into space and muscle tightness in the setting of rEEG and LTM EEG being negative for electrographic seizure activity.  -  On exam, patient had some baseline focality consistent with significant right-sided brain injury (hyperreflexia of left patellar).  Also some left-sided esotropia, baseline. Otherwise, no fasciculations, tremor or myoclonus were seen, either elicitable or at rest.  He does have cognitive impairments consistent with his known past history of such.  - We believe that patient's staring spells likely do not have a obvious seizure etiology in setting of repeat EEGs negative for seizure/epileptiform activity over the last 2 days.  Noted similar findings on 2019 hospitalization, which were also not seen on EEG -- patient was not started on AEDs at that time. - While patient had an unwitnessed fall with +/- head strike, head CT was negative for acute intracranial abnormality.  No new focal deficits to suggest subsequent brain injury.  - In patient's hallucinations, disorientation, confusion there is likely an element of delirium, which we believe has a metabolic versus iatrogenic (IV hydrocortisone) etiology.  It does not appear that patient is on any other deliriogenic medication at this  time.   - Recommend delirium precautions.  If agitation meds for chemical restraint are necessary, recommend using extreme care if considering antipsychotics with most recent QTc of 574. (Per literature review, Olanzapine has been shown to cause very low QTc interval prolongation from 1.7 to 6.8 ms at up to 20mg /day. [facility] et al, 2004)). Unfortunately, benzodiazepines are not indicated for, and can, in fact, worsen delirium in patients who are not undergoing alcohol withdrawal.   RECOMMENDATIONS  - Begin delirium precautions, details in order set. - Maintain K >4.0 and Mg >2.0 in setting of prolonged QTc.  - Consider MRI brain if new focal findings develop.  - Discontinuing LTM EEG - Will need outpatient Neurology follow up.  - Neurohospitalist service will sign off at this  time. ______________________________________________________________________  Sharol Given, MD [facility] Psychiatric Residency, PGY-1  I have seen and examined the patient. I have discussed the assessment and recommendations with the Psychiatry resident and have made amendations as needed. 23 y.o. male with a hx of intellectual disability, panhypopituitarism, central hypothyroidism, adrenal insufficiency, ADHD and a remote history of metabolic stroke x1, who presented 3/19 with a three week history of nausea, vomiting found to have pre-renal AKI and to be in adrenal crisis. Neurology was consulted for staring spells and hallucinations this admission. On exam, patient had some baseline focality consistent with significant right-sided brain injury (hyperreflexia of left patellar).  Also some left-sided esotropia, baseline. Otherwise, no fasciculations, tremor or myoclonus were seen, either elicitable or at rest.  He does have cognitive impairments consistent with his known past history of such. MRI brain is stable. EEGs are suggestive of moderate diffuse encephalopathy, with no seizures or epileptiform discharges seen throughout the recording. Recommendations as above.   Electronically signed: Dr. Caryl Pina

## 2023-05-24 NOTE — Assessment & Plan Note (Addendum)
 No vomiting/diarrhea overnight, though did complain of abdominal pain.  No difficulties with PO yesterday.  -Follow-up blood cultures - Zofran and Reglan held given QT prolongation as below - If nausea/vomiting recurs, consider Tigan -Encourage good p.o. intake - AM BMP

## 2023-05-24 NOTE — Assessment & Plan Note (Signed)
 Hypoglycemia-held home glucagon, CBG Q8H Central hypothyroidism-TSH elevated, T4 low at 0.36. Discuss with pt's primary endocrinologist Dr. Sharl Ma. H/o Seizures: mom concerned for possible seizure episode overnight with staring, then agitation. Will obtain EEG, consider consulting neurology.

## 2023-05-24 NOTE — Hospital Course (Signed)
 Sean Kim is a 23 y.o.male with a history of developmental delay, central hypothyroidism, adrenal insufficiency and remote history of seizures and metabolic stroke x1 who was admitted to the [facility] Medicine Teaching Service at [facility] for suspected adrenal crisis 2/2 nausea/vomiting/diarrhea.   His hospital course is detailed below:  Nausea/vomiting/diarrhea Patient was initially offered Zofran and then Reglan for nausea and vomiting, however this was discontinued due to QT prolongation as below.  Fortunately symptoms resolved quickly following admission and patient was able to PO well without difficulty.  No further episodes of N/V/D while hospitalized.  Adrenal crisis Patient with 1 month history of nausea/vomiting/diarrhea difficulty taking daily medications.  He did have 1 episode of possible syncope and presented to his endocrinologist was concerned for adrenal crisis and sent him to the ED.  In the ED patient was worked up with head CT and CT A/P which were both without acute abnormality.  Labs did show evidence of dehydration and he received IV fluid bolus rehydration.  He was also bolused with 100 mg Cortef and admitted for further management.  Per adrenal crisis guidelines he was continued on 50 mg IV Cortef every 6 hours for the first 24 hours.  He was then transitioned to 30 mg oral Cortef daily per instructions by his outpatient endocrinologist Dr. Sharl Ma.  AM serum cortisol labs did show cortisol >100, so likely not a true adrenal crisis.   Seizure-like activity Patient does have remote history of seizures, no longer followed by neurology or on any seizure medications.  Overnight on his first night of admission mom was concerned for staring spells which mimicked prior seizure activity.  Patient did have overnight EEG which did not show evidence of seizure.  Neurology was consulted and recommended***.  Central hypothyroidism Did have T4 derangement on labs, very likely related to inability  to regularly take home Synthroid over the last few weeks due to nausea/vomiting.  Per instructions by outpatient endocrinologist Dr. Sharl Ma, patient was restarted on home Synthroid dose 137 mcg daily.  QT prolongation This was apparently seen on prior hospitalization in 2019 as well at which time cardiology was consulted and did not think this was Brugada syndrome.  Repeat EKG after admission did show similar, but not worsening, QT prolongation.  QT prolonging medications were held.  Patient remained asymptomatic throughout hospitalization.  Sinusitis Patient incidentally found to have evidence of sinusitis on head CT, though he remained largely without symptoms except for occasional complaint of headache which may be attributable to sinus pressure.  As such he was treated with doxycycline 100 mg BID for 5 days (3/19 - 3/23).  Other chronic conditions were medically managed with home medications and formulary alternatives as necessary (***)     PCP Follow-up Recommendations:

## 2023-05-25 ENCOUNTER — Other Ambulatory Visit (HOSPITAL_COMMUNITY): Payer: Self-pay

## 2023-05-25 DIAGNOSIS — R569 Unspecified convulsions: Secondary | ICD-10-CM | POA: Diagnosis not present

## 2023-05-25 LAB — BASIC METABOLIC PANEL
Anion gap: 4 — ABNORMAL LOW (ref 5–15)
BUN: 10 mg/dL (ref 6–20)
CO2: 24 mmol/L (ref 22–32)
Calcium: 8 mg/dL — ABNORMAL LOW (ref 8.9–10.3)
Chloride: 109 mmol/L (ref 98–111)
Creatinine, Ser: 0.8 mg/dL (ref 0.61–1.24)
GFR, Estimated: >60 mL/min (ref >60)
Glucose, Bld: 103 mg/dL — ABNORMAL HIGH (ref 70–99)
Potassium: 4.3 mmol/L (ref 3.5–5.1)
Sodium: 137 mmol/L (ref 135–145)

## 2023-05-25 MED ORDER — HYDROCORTISONE 10 MG PO TABS
30.0000 mg | ORAL_TABLET | Freq: Every day | ORAL | 0 refills | Status: AC
  Filled 2023-05-25: qty 9, 3d supply, fill #0

## 2023-05-25 MED ORDER — DOXYCYCLINE HYCLATE 100 MG PO TABS
100.0000 mg | ORAL_TABLET | Freq: Two times a day (BID) | ORAL | 0 refills | Status: AC
Start: 2023-05-25 — End: 2023-05-28
  Filled 2023-05-25: qty 5, 3d supply, fill #0

## 2023-05-25 MED ORDER — POLYETHYLENE GLYCOL 3350 17 GM/SCOOP PO POWD
17.0000 g | Freq: Every day | ORAL | 0 refills | Status: AC
Start: 2023-05-25 — End: ?
  Filled 2023-05-25: qty 476, 28d supply, fill #0

## 2023-05-25 MED ORDER — FLUTICASONE PROPIONATE 50 MCG/ACT NA SUSP
2.0000 | Freq: Every day | NASAL | 0 refills | Status: DC
  Filled 2023-05-25: qty 16, 30d supply, fill #0

## 2023-05-25 MED ORDER — ACETAMINOPHEN 325 MG PO TABS: 650.0000 mg | ORAL_TABLET | Freq: Four times a day (QID) | ORAL | Status: DC | PRN

## 2023-05-25 NOTE — Procedures (Signed)
 Patient Name: Admir Candelas  MRN: [id number]  Epilepsy Attending: Charlsie Quest  Referring Physician/Provider: Caryl Pina, MD  Duration: 05/24/2023 1646 to 05/24/2023 2153   Patient history:22yo M. He had an episode of staring off and losing control of his bladder. EEG to evaluate for seizure   Level of alertness: Awake, asleep   AEDs during EEG study: None   Technical aspects: This EEG study was done with scalp electrodes positioned according to the 10-20 International system of electrode placement. Electrical activity was reviewed with band pass filter of 1-70Hz , sensitivity of 7 uV/mm, display speed of 20mm/sec with a 60Hz  notched filter applied as appropriate. EEG data were recorded continuously and digitally stored.  Video monitoring was available and reviewed as appropriate.   Description: EEG showed continuous generalized polymorphic 3 to 5 Hz theta-delta slowing. Sleep was characterized by sleep spindles (12-14Hz ), maximal fronto-central region.   ABNORMALITY - Continuous slow, generalized   IMPRESSION: This study is suggestive of moderate diffuse encephalopathy. No seizures or epileptiform discharges were seen throughout the recording.   Marirose Deveney Annabelle Harman

## 2023-05-25 NOTE — Evaluation (Signed)
 Occupational Therapy Evaluation Patient Details Name: Sean Kim MRN: [id number] DOB: 01/20/2001 Today's Date: 05/25/2023   History of Present Illness   23 y/o M presents to [facility] on 3/18 for nausea, vomiting and a syncopal episode. Pt found to have pre-renal AKI and to be in adrenal crisis. EEG results suggestive of moderate diffuse encephalopathy. PMH:  hx of intellectual disability, panhypopituitarism, central hypothyroidism, adrenal insufficiency, ADHD and a remote history of metabolic stroke x1.     Clinical Impressions PTA pt lives with his Mom and siblings who provide 24/7 care as needed. At baseline, Sean Kim is independent with mobility and requires occasional VC for ADL tasks and enjoys playing video games. Mom present and states Sean Kim is more "off balance" and requires more assistance for his ADL tasks at this time. Feel Sean Kim will benefit from [facility] in his familiar environment to maximize his functional level of independence. Mom in agreement. Acute OT will follow to facilitate safe DC home.      If plan is discharge home, recommend the following:   A little help with bathing/dressing/bathroom;Assistance with cooking/housework;Direct supervision/assist for financial management;Direct supervision/assist for medications management;Assist for transportation;Help with stairs or ramp for entrance     Functional Status Assessment   Patient has had a recent decline in their functional status and demonstrates the ability to make significant improvements in function in a reasonable and predictable amount of time.     Equipment Recommendations   Tub/shower seat     Recommendations for Other Services   PT consult     Precautions/Restrictions   Precautions Precautions: Fall     Mobility Bed Mobility Overal bed mobility: Modified Independent                  Transfers Overall transfer level: Needs assistance   Transfers: Sit to/from Stand Sit to Stand:  Contact guard assist                  Balance Overall balance assessment: Needs assistance   Sitting balance-Leahy Scale: Good       Standing balance-Leahy Scale: Fair                             ADL either performed or assessed with clinical judgement   ADL Overall ADL's : Needs assistance/impaired Eating/Feeding: Set up (poor po intake)   Grooming: Set up;Supervision/safety;Sitting   Upper Body Bathing: Set up;Supervision/ safety;Sitting   Lower Body Bathing: Minimal assistance;Sit to/from stand   Upper Body Dressing : Set up;Supervision/safety;Sitting   Lower Body Dressing: Contact guard assist;Sit to/from stand   Toilet Transfer: Minimal assistance;Ambulation   Toileting- Clothing Manipulation and Hygiene: Contact guard assist       Functional mobility during ADLs: Minimal assistance General ADL Comments: 1 LOB while walking in room; pt able to recover; Mom states he is "more off balance"     Vision Baseline Vision/History: 1 Wears glasses Ability to See in Adequate Light: 0 Adequate Additional Comments: decreased visual attention however pt reading signs around room; able to "see ok"     Perception Perception: Within Functional Limits       Praxis Praxis: [facility]       Pertinent Vitals/Pain Pain Assessment Pain Assessment: Faces Faces Pain Scale: Hurts a little bit Pain Location: abdomen Pain Descriptors / Indicators: Discomfort Pain Intervention(s): Limited activity within patient's tolerance     Extremity/Trunk Assessment Upper Extremity Assessment Upper Extremity Assessment: Generalized weakness (overall WFL; limited  B shoulder ROM to @ 100 however functional)   Lower Extremity Assessment Lower Extremity Assessment: Defer to PT evaluation   Cervical / Trunk Assessment Cervical / Trunk Assessment: Normal   Communication Communication Communication: No apparent difficulties   Cognition Arousal: Alert Behavior During Therapy:  Flat affect Cognition: Cognition impaired     Awareness: Intellectual awareness intact, Online awareness impaired Memory impairment (select all impairments): Working Civil Service fast streamer, Conservation officer, historic buildings Attention impairment (select first level of impairment): Selective attention Executive functioning impairment (select all impairments): Reasoning, Problem solving OT - Cognition Comments: slower processing                 Following commands: Intact       Cueing  General Comments      enjoys playing video games   Exercises     Shoulder Instructions      Home Living Family/patient expects to be discharged to:: Private residence Living Arrangements: Parent;Other relatives Available Help at Discharge: Family;Available 24 hours/day Type of Home: House Home Access: Stairs to enter [employer] of Steps: a few Entrance Stairs-Rails: Right;Left Home Layout: Two level;Bed/bath upstairs;1/2 bath on main level Alternate Level Stairs-Number of Steps: 12 Alternate Level Stairs-Rails: Right;Left Bathroom Shower/Tub: Tub/shower unit;Curtain   Bathroom Toilet: Standard Bathroom Accessibility: Yes How Accessible: Accessible via walker Home Equipment: None          Prior Functioning/Environment Prior Level of Function : Needs assist  Cognitive Assist : ADLs (cognitive)   ADLs (Cognitive): Intermittent cues       Mobility Comments: Independent with mobility ADLs Comments: Mom states occasional cues to redirect    OT Problem List: Decreased strength;Impaired balance (sitting and/or standing);Decreased activity tolerance;Decreased cognition;Decreased safety awareness   OT Treatment/Interventions: Self-care/ADL training;Therapeutic exercise;DME and/or AE instruction;Therapeutic activities;Cognitive remediation/compensation;Patient/family education;Balance training      OT Goals(Current goals can be found in the care plan section)   Acute Rehab OT  Goals Patient Stated Goal: to go home OT Goal Formulation: With patient/family Time For Goal Achievement: 06/08/23 Potential to Achieve Goals: Good   OT Frequency:  Min 2X/week    Co-evaluation              AM-PAC OT "6 Clicks" Daily Activity     Outcome Measure Help from another person eating meals?: A Little Help from another person taking care of personal grooming?: A Little Help from another person toileting, which includes using toliet, bedpan, or urinal?: A Little Help from another person bathing (including washing, rinsing, drying)?: A Little Help from another person to put on and taking off regular upper body clothing?: A Little Help from another person to put on and taking off regular lower body clothing?: A Little 6 Click Score: 18   End of Session Equipment Utilized During Treatment: Gait belt Nurse Communication: Mobility status  Activity Tolerance: Patient tolerated treatment well Patient left: in bed;with call bell/phone within reach;with family/visitor present  OT Visit Diagnosis: Unsteadiness on feet (R26.81);Other abnormalities of gait and mobility (R26.89);Muscle weakness (generalized) (M62.81);Other symptoms and signs involving cognitive function                Time: 0940-1000 OT Time Calculation (min): 20 min Charges:  OT General Charges $OT Visit: 1 Visit OT Evaluation $OT Eval Moderate Complexity: 1 Mod  Lorine Iannaccone, OT/L   Acute OT Clinical Specialist Acute Rehabilitation Services Pager [phone] Office [phone]   [facility] 05/25/2023, 10:13 AM

## 2023-05-25 NOTE — Discharge Instructions (Addendum)
 Dear Domingo Cocking,   Thank you for letting us participate in your care! You were admitted to the hospital for nausea and vomiting which lead to dehydration and acute kidney injury. This improved with fluids. You had an EEG that did not show any seizure like activity. Your home dose of Cortef will be increased for 5 days, then you will continue taking your regular dose of Cortef 10 mg daily.   You will need to finish your antibiotic course at home for your sinusitis.  You will continue to take doxycycline 100 mg twice daily through 3/23.  You already received your morning dose here in the hospital, start tonight (3/21) by taking 1 pill.  Then tomorrow (3/22) take 1 pill in the morning and 1 in the evening.  Continue this until the medication is gone.  Please make sure you are taking your prescribed medication and following up with your endocrinologist frequently.  To get a Home Health Aide please go online to find a personal care service agency, contact the agency and they will screen if you qualify thru your insurance, if you do not it will be out of pocket, if you do qualify they will send paper-work to your PCP to fill out.   POST-HOSPITAL & CARE INSTRUCTIONS We recommend following up with your PCP within 1 week from being discharged from the hospital. Please let PCP/Specialists know of any changes in medications that were made which you will be able to see in the medications section of this packet. Please also follow up with [facility]. Call them at [phone] to make an appointment.  DOCTOR'S APPOINTMENTS & FOLLOW UP No future appointments.   Thank you for choosing [facility]! Take care and be well!  Family Medicine Teaching Service Inpatient Team [facility]  [address] [place], [place] [address] [phone]

## 2023-05-25 NOTE — Discharge Summary (Signed)
 Family Medicine Teaching [facility] Discharge Summary  Patient name: Sean Kim Medical record number: [id number] Date of birth: [birth year] Age: 23 y.o. Gender: male Date of Admission: 05/22/2023  Date of Discharge: 05/25/2023 Admitting Physician: Cyndia Skeeters, DO  Primary Care Provider: Levin Erp, MD Consultants: Neurology  Indication for Hospitalization: Concern for adrenal crisis  Brief Hospital Course:  Jalene Demartini is a 23 y.o.male with a history of developmental delay, central hypothyroidism, adrenal insufficiency and remote history of seizures and metabolic stroke x1 who was admitted to the [facility] Medicine Teaching Service at [facility] for suspected adrenal crisis 2/2 nausea/vomiting/diarrhea.   His hospital course is detailed below:  Nausea/vomiting/diarrhea Patient was initially offered Zofran and then Reglan for nausea and vomiting, however this was discontinued due to QT prolongation as below.  Fortunately symptoms resolved quickly following admission and patient was able to PO well without difficulty.  No further episodes of N/V/D while hospitalized.  Adrenal crisis Patient with 1 month history of nausea/vomiting/diarrhea difficulty taking daily medications.  He did have 1 episode of possible syncope and presented to his endocrinologist was concerned for adrenal crisis and sent him to the ED.  In the ED patient was worked up with head CT and CT A/P which were both without acute abnormality.  Labs did show evidence of dehydration and he received IV fluid bolus rehydration.  He was also bolused with 100 mg Cortef and admitted for further management.  Per adrenal crisis guidelines he was continued on 50 mg IV Cortef every 6 hours for the first 24 hours.  He was then transitioned to 30 mg oral Cortef daily per instructions by his outpatient endocrinologist Dr. Sharl Ma.  AM serum cortisol labs did show cortisol >100, so likely not a true adrenal crisis.  Patient continued to do  well and was discharged in stable condition.  Seizure-like activity Patient does have remote history of seizures, no longer followed by neurology or on any seizure medications.  Overnight on his first night of admission mom was concerned for staring spells which mimicked prior seizure activity.  Patient did have overnight EEG which did not show evidence of seizure.  Neurology was consulted and recommended close follow-up outpatient.  Central hypothyroidism Did have T4 derangement on labs, very likely related to inability to regularly take home Synthroid over the last few weeks due to nausea/vomiting.  Per instructions by outpatient endocrinologist Dr. Sharl Ma, patient was restarted on home Synthroid dose 137 mcg daily.  QT prolongation This was apparently seen on prior hospitalization in 2019 as well at which time cardiology was consulted and did not think this was Brugada syndrome.  Repeat EKG after admission did show similar, but not worsening, QT prolongation.  QT prolonging medications were held.  Patient remained asymptomatic throughout hospitalization.  Sinusitis Patient incidentally found to have evidence of sinusitis on head CT, though he remained largely without symptoms except for occasional complaint of headache which may be attributable to sinus pressure.  As such he was treated with doxycycline 100 mg BID for 5 days (3/19 - 3/23).  Other chronic conditions were medically managed with home medications and formulary alternatives as necessary.   PCP Follow-up Recommendations: Repeat BMP to ensure potassium WNL (goal >4 in setting of prolonged Qtc), and check CBC Recheck TSH once adequately treated with his synthroid   Discharge Diagnoses/Problem List:  Principal Problem:   Adrenal crisis [facility]) Active Problems:   Nausea vomiting and diarrhea   Abdominal pain   Headache   Chronic health  problem   AKI (acute kidney injury) (HCC)   Agitation   Long QT syndrome   Central  hypothyroidism   Sinusitis   Seizure-like activity (HCC)  Disposition: Home  Discharge Condition: Stable  Discharge Exam: General: Well-appearing, interactive, NAD Respiratory: Normal work of breathing on room air Cardiovascular: RRR, no murmurs Extremities: Moves all equally  Significant Procedures: None  Significant Labs and Imaging:  Recent Labs  Lab 05/24/23 2039  WBC 5.0  HGB 9.5*  HCT 29.5*  PLT 233   Recent Labs  Lab 05/24/23 2039 05/25/23 0615  NA 138 137  K 3.3* 4.3  CL 105 109  CO2 23 24  GLUCOSE 154* 103*  BUN 12 10  CREATININE 0.85 0.80  CALCIUM 8.7* 8.0*    3/18 CXR: Low lung volumes with mild bibasilar atelectasis.  No evidence of pneumonia.   3/18 CT head: 1. No acute intracranial abnormality. Chronic right cerebral hemiatrophy, stable since 2019. 2. Widespread paranasal sinus inflammation is new since that time. Consider Acute sinusitis.   3/18 CT A/P: 1. No acute intra-abdominal process.   3/19 - 3/20 EEG: Moderate diffuse encephalopathy, no seizures or epileptiform discharges seen.  Results/Tests Pending at Time of Discharge: None  Discharge Medications:  Allergies as of 05/25/2023   No Known Allergies      Medication List     STOP taking these medications    benzonatate 100 MG capsule Commonly known as: Tessalon Perles   loperamide 2 MG capsule Commonly known as: IMODIUM       TAKE these medications    acetaminophen 325 MG tablet Commonly known as: TYLENOL Take 2 tablets (650 mg total) by mouth every 6 (six) hours as needed for mild pain (pain score 1-3) (or Fever >/= 101).   doxycycline 100 MG tablet Commonly known as: VIBRA-TABS Take 1 tablet (100 mg total) by mouth every 12 (twelve) hours for 5 doses.   fluticasone 50 MCG/ACT nasal spray Commonly known as: FLONASE Place 2 sprays into both nostrils daily. Start taking on: May 26, 2023   GLUCAGON EMERGENCY IJ Inject 1 mg as directed as needed (blood sugar).    hydrocortisone 10 MG tablet Commonly known as: CORTEF Take 10 mg by mouth daily. Take 30 mg by mouth as needed for sickness What changed: Another medication with the same name was added. Make sure you understand how and when to take each.   hydrocortisone 10 MG tablet Commonly known as: CORTEF Take 3 tablets (30 mg total) by mouth daily for 3 days. Starting 3/25, please go back to home dose of 10 mg daily. Start taking on: May 26, 2023 What changed: You were already taking a medication with the same name, and this prescription was added. Make sure you understand how and when to take each.   ibuprofen 200 MG tablet Commonly known as: ADVIL Take 600 mg by mouth every 6 (six) hours as needed for mild pain (pain score 1-3), fever or moderate pain (pain score 4-6).   levothyroxine 137 MCG tablet Commonly known as: SYNTHROID Take 137 mcg by mouth every morning.   ondansetron 4 MG disintegrating tablet Commonly known as: ZOFRAN-ODT Take 1 tablet (4 mg total) by mouth every 8 (eight) hours as needed for nausea or vomiting.   polyethylene glycol powder 17 GM/SCOOP powder Commonly known as: GLYCOLAX/MIRALAX Take 1 capful (17 g) by mouth daily.        Discharge Instructions: Please refer to Patient Instructions section of EMR for full details.  Patient was counseled important signs and symptoms that should prompt return to medical care, changes in medications, dietary instructions, activity restrictions, and follow up appointments.   Follow-Up Appointments: -Please keep your scheduled follow-up appointment with Dr. Sharl Ma, endocrinology - Please call [place] neurology to schedule follow-up.  Cyndia Skeeters, DO 05/25/2023, 11:26 AM PGY-1, [facility] Family Medicine

## 2023-05-25 NOTE — Evaluation (Signed)
 Physical Therapy Evaluation and Discharge Patient Details Name: Sean Kim MRN: [id number] DOB: Aug 26, 2000 Today's Date: 05/25/2023  History of Present Illness  23 y/o M presents to [facility] on 3/18 for nausea, vomiting and a syncopal episode. Pt found to have pre-renal AKI and to be in adrenal crisis. EEG results suggestive of moderate diffuse encephalopathy. PMH:  hx of intellectual disability, panhypopituitarism, central hypothyroidism, adrenal insufficiency, ADHD and a remote history of metabolic stroke x1.  Clinical Impression  Patient is agreeable to participate with therapy and was seen in bed with mom present and supportive. Patient's family provides 24/7 care as needed. Patient, at baseline, and is independent with most ADLs/IADLs prior to admission to [facility]. He presents with decreased endurance/activity tolerance, impaired balance, and generalized LE muscle weakness. Patient able to transition himself EOB independently and requires CGA for transfers, stairs,  and when ambulating 176ft around the unit w/o an AD. Overall, patient is making good progress and is eager to return home. The patient would benefit from HHPT to address his listed impairments. No further acute PT needs recommended at this time. Thank you for this consult.     If plan is discharge home, recommend the following: Assistance with cooking/housework;Assist for transportation;Help with stairs or ramp for entrance   Can travel by private vehicle        Equipment Recommendations None recommended by PT  Recommendations for Other Services       Functional Status Assessment Patient has had a recent decline in their functional status and demonstrates the ability to make significant improvements in function in a reasonable and predictable amount of time.     Precautions / Restrictions Precautions Precautions: Fall Restrictions Weight Bearing Restrictions Per Provider Order: No      Mobility  Bed Mobility Overal bed  mobility: Independent                  Transfers Overall transfer level: Needs assistance Equipment used: None Transfers: Sit to/from Stand Sit to Stand: Contact guard assist           General transfer comment: CGA to ensure safety    Ambulation/Gait Ambulation/Gait assistance: Contact guard assist Gait Distance (Feet): 150 Feet Assistive device: None Gait Pattern/deviations: WFL(Within Functional Limits)          Stairs Stairs: Yes Stairs assistance: Contact guard assist Stair Management: One rail Left, Forwards, Alternating pattern, Step to pattern Number of Stairs: 12 General stair comments: Cues for technique and safety  Wheelchair Mobility     Tilt Bed    Modified Rankin (Stroke Patients Only)       Balance Overall balance assessment: Needs assistance Sitting-balance support: Feet supported Sitting balance-Leahy Scale: Good     Standing balance support: No upper extremity supported Standing balance-Leahy Scale: Good                               Pertinent Vitals/Pain Pain Assessment Pain Assessment: Faces Faces Pain Scale: Hurts a little bit Pain Location: abdomen Pain Descriptors / Indicators: Discomfort Pain Intervention(s): Monitored during session    Home Living Family/patient expects to be discharged to:: Private residence Living Arrangements: Parent;Other relatives Available Help at Discharge: Family;Available 24 hours/day Type of Home: House Home Access: Stairs to enter Entrance Stairs-Rails: Doctor, general practice of Steps: a few Alternate Level Stairs-Number of Steps: 12 Home Layout: Two level;Bed/bath upstairs;1/2 bath on main level Home Equipment: None      Prior  Function Prior Level of Function : Needs assist  Cognitive Assist : ADLs (cognitive)   ADLs (Cognitive): Intermittent cues       Mobility Comments: Independent with mobility ADLs Comments: Mom states occasional cues to redirect      Extremity/Trunk Assessment   Upper Extremity Assessment Upper Extremity Assessment: Defer to OT evaluation    Lower Extremity Assessment Lower Extremity Assessment: Generalized weakness (Initial unsteadiness would benefit from LE strengthening to improve balance)    Cervical / Trunk Assessment Cervical / Trunk Assessment: Normal  Communication   Communication Communication: No apparent difficulties    Cognition Arousal: Alert Behavior During Therapy: Flat affect                             Following commands: Intact       Cueing Cueing Techniques: Verbal cues, Tactile cues, Gestural cues     General Comments General comments (skin integrity, edema, etc.): Mom was present and supportive throughout today's session    Exercises     Assessment/Plan    PT Assessment All further PT needs can be met in the next venue of care  PT Problem List Decreased strength;Decreased activity tolerance;Decreased balance;Decreased mobility;Decreased coordination       PT Treatment Interventions      PT Goals (Current goals can be found in the Care Plan section)  Acute Rehab PT Goals Patient Stated Goal: Retrun home and return to PLOF PT Goal Formulation: With patient Time For Goal Achievement: 06/08/23 Potential to Achieve Goals: Good    Frequency       Co-evaluation               AM-PAC PT "6 Clicks" Mobility  Outcome Measure Help needed turning from your back to your side while in a flat bed without using bedrails?: None Help needed moving from lying on your back to sitting on the side of a flat bed without using bedrails?: None Help needed moving to and from a bed to a chair (including a wheelchair)?: A Little Help needed standing up from a chair using your arms (e.g., wheelchair or bedside chair)?: A Little Help needed to walk in hospital room?: A Little Help needed climbing 3-5 steps with a railing? : A Little 6 Click Score: 20    End of Session  Equipment Utilized During Treatment: Gait belt Activity Tolerance: Patient tolerated treatment well Patient left: in bed;with call bell/phone within reach;with bed alarm set;with family/visitor present   PT Visit Diagnosis: Difficulty in walking, not elsewhere classified (R26.2);Muscle weakness (generalized) (M62.81)    Time: 4132-4401 PT Time Calculation (min) (ACUTE ONLY): 12 min   Charges:   PT Evaluation $PT Eval Low Complexity: 1 Low   PT General Charges $$ ACUTE PT VISIT: 1 Visit        Doreen Beam, SPT   [facility] Durnil 05/25/2023, 11:10 AM

## 2023-05-27 LAB — CULTURE, BLOOD (ROUTINE X 2)
Culture: NO GROWTH
Culture: NO GROWTH

## 2023-05-30 ENCOUNTER — Ambulatory Visit: Payer: Self-pay | Admitting: Student

## 2023-05-30 ENCOUNTER — Ambulatory Visit (INDEPENDENT_AMBULATORY_CARE_PROVIDER_SITE_OTHER): Payer: MEDICAID | Admitting: Student

## 2023-05-30 VITALS — BP 104/70 | HR 73 | Wt 209.2 lb

## 2023-05-30 DIAGNOSIS — I4581 Long QT syndrome: Secondary | ICD-10-CM

## 2023-05-30 DIAGNOSIS — R112 Nausea with vomiting, unspecified: Secondary | ICD-10-CM | POA: Diagnosis not present

## 2023-05-30 DIAGNOSIS — R197 Diarrhea, unspecified: Secondary | ICD-10-CM

## 2023-05-30 NOTE — Addendum Note (Signed)
 Addended by: Jennette Bill on: 05/30/2023 12:21 PM   Modules accepted: Orders

## 2023-05-30 NOTE — Assessment & Plan Note (Signed)
 Only 1 episode since hopsitalization, now resolved. Well appearing. Less likely adrenal crisis but continuing steroids for now. -Endocrinology appt tomorrow - CBC, BMP

## 2023-05-30 NOTE — Patient Instructions (Signed)
 It was great to see you! Thank you for allowing me to participate in your care!   Our plans for today:  - We will check some labs today - Please follow up with endocrinology tomorrow! - Please return to care if more nausea, vomiting, diarrhea   Take care and seek immediate care sooner if you develop any concerns.  Levin Erp, MD

## 2023-05-30 NOTE — Progress Notes (Signed)
    SUBJECTIVE:   CHIEF COMPLAINT / HPI: Hospital fu  Discussed the use of AI scribe software for clinical note transcription with the patient, who gave verbal consent to proceed.  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 He denies any recurrence of the 'seizure like activity' that was observed during his hospital stay. He denies lightheadedness and reports normal eating and drinking habits.  The patient is currently on hydrocortisone 10mg  daily, levothyroxine 137mg  daily, and Flonase. He has a follow-up appointment with endocrinology scheduled for tomorrow. He denies fevers.  PERTINENT  PMH / PSH: panhypopituitarism, hypothyroidism (central), adrenal insufficiency, seizure disorder   OBJECTIVE:   BP 104/70   Pulse 73   Wt 209 lb 3.2 oz (94.9 kg)   SpO2 97%   BMI 30.89 kg/m   General: Well appearing, NAD, awake, alert, responsive to questions Head: Normocephalic atraumatic CV: Regular rate and rhythm no murmurs rubs or gallops Respiratory: Clear to ausculation bilaterally, no wheezes rales or crackles, chest rises symmetrically,  no increased work of breathing Abdomen: Soft, non-tender, non-distended, normoactive bowel sounds  Extremities: Moves upper and lower extremities freely  ASSESSMENT/PLAN:   Assessment & Plan Nausea vomiting and diarrhea Only 1 episode since hopsitalization, now resolved. Well appearing. Less likely adrenal crisis but  continuing steroids for now. -Endocrinology appt tomorrow - CBC, BMP Long QT syndrome -Check BMP, keep K >4   Levin Erp, MD [facility]

## 2023-05-30 NOTE — Assessment & Plan Note (Signed)
-  Check BMP, keep K >4

## 2023-05-31 ENCOUNTER — Other Ambulatory Visit

## 2023-06-25 ENCOUNTER — Ambulatory Visit (HOSPITAL_COMMUNITY)
Admission: RE | Admit: 2023-06-25 | Discharge: 2023-06-25 | Disposition: A | Discharge status: Home / Self Care | Payer: MEDICAID | Source: Ambulatory Visit | Attending: Emergency Medicine | Admitting: Emergency Medicine

## 2023-06-25 ENCOUNTER — Encounter (HOSPITAL_COMMUNITY): Payer: Self-pay

## 2023-06-25 ENCOUNTER — Ambulatory Visit (INDEPENDENT_AMBULATORY_CARE_PROVIDER_SITE_OTHER): Payer: MEDICAID

## 2023-06-25 VITALS — BP 105/72 | HR 93 | Temp 98.0°F | Resp 16

## 2023-06-25 DIAGNOSIS — W19XXXA Unspecified fall, initial encounter: Secondary | ICD-10-CM | POA: Diagnosis not present

## 2023-06-25 DIAGNOSIS — M25521 Pain in right elbow: Secondary | ICD-10-CM | POA: Diagnosis not present

## 2023-06-25 DIAGNOSIS — M79601 Pain in right arm: Secondary | ICD-10-CM

## 2023-06-25 MED ORDER — IBUPROFEN 400 MG PO TABS: 400.0000 mg | ORAL_TABLET | Freq: Four times a day (QID) | ORAL | 0 refills | Status: DC | PRN

## 2023-06-25 MED ORDER — ACETAMINOPHEN 325 MG PO TABS: 650.0000 mg | ORAL_TABLET | Freq: Four times a day (QID) | ORAL | 0 refills | Status: AC | PRN

## 2023-06-25 NOTE — Discharge Instructions (Signed)
 Alternate between Tylenol  and ibuprofen  every 6-8 hours as needed for pain. Otherwise alternate between ice and heat as needed for pain, and do some gentle stretching. I have attached  sports medicine that you can follow-up with if your pain persists. Return here as needed.

## 2023-06-25 NOTE — ED Triage Notes (Signed)
 Pt reports jumping at trampoline park 4/19 when he "came down on right arm wrong". Since then, continues with right upper arm and elbow pain. RUE CMS intact. C/O difficulty extending RUE at the elbow.

## 2023-06-25 NOTE — ED Provider Notes (Signed)
 [facility]    CSN: [id number] Arrival date & time: 06/25/23  1425      History   Chief Complaint Chief Complaint  Patient presents with   Arm Injury    HPI Sean Kim is a 23 y.o. male.   Patient presents with right upper arm and right elbow pain.  Patient states that he was jumping at a trampoline park when he fell and landed on his right arm.  Patient endorses some difficulty extending his right elbow otherwise patient is able to move use right upper extremity without difficulty.  The history is provided by the patient and medical records.  Arm Injury   Past Medical History:  Diagnosis Date   ADHD    COVID-19 virus infection    Hypoglycemia    MR (mental retardation)    Pituitary abnormality (HCC)    Seizures (HCC)    Stroke [facility])    Thyroid disease     Patient Active Problem List   Diagnosis Date Noted   Seizure-like activity (HCC) 05/24/2023   Agitation 05/23/2023   Long QT syndrome 05/23/2023   Central hypothyroidism 05/23/2023   Sinusitis 05/23/2023   Nausea vomiting and diarrhea 05/22/2023   Abdominal pain 05/22/2023   Headache 05/22/2023   Chronic health problem 05/22/2023   AKI (acute kidney injury) (HCC) 05/22/2023   Adrenal crisis (HCC) 05/22/2023   Sprain of anterior talofibular ligament of left ankle 11/10/2022   Pain of neck with recent traumatic injury 05/31/2022   Retained foreign body of middle ear, bilateral 03/16/2022   Caregiver burden 07/05/2019   History of attempted suicide 07/05/2019   Constipation 11/12/2018   Abnormal EKG 11/12/2018   Encephalopathy due to COVID-19 virus 10/18/2018   Elevated alkaline phosphatase level 05/29/2018   Annual physical exam 10/31/2017   Healthy adult on routine physical examination 10/31/2017   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[facility])    Cognitive deficits    Intermittent left side weakness    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 Authorizing Provider  acetaminophen  (TYLENOL ) 325 MG tablet Take 2 tablets (650 mg total) by mouth every 6 (six) hours as needed for mild pain (pain score 1-3) or moderate pain (pain score 4-6). 06/25/23  Yes Rosevelt Constable, Shelby Peltz A, NP  hydrocortisone  (CORTEF ) 10 MG tablet Take 10 mg by mouth daily. Take 30 mg by mouth as needed for sickness   Yes [provider]  ibuprofen  (ADVIL ) 400 MG tablet Take 1 tablet (400 mg total) by mouth every 6 (six) hours as needed for mild pain (pain score 1-3) or moderate pain (pain score 4-6). 06/25/23  Yes Rosevelt Constable, Margarete Horace A, NP  levothyroxine  (SYNTHROID ) 137 MCG tablet Take 137 mcg by mouth every morning. 03/13/19  Yes [provider]  polyethylene glycol powder (GLYCOLAX /MIRALAX ) 17 GM/SCOOP powder Take 1 capful (17 g) by mouth daily. 05/25/23  Yes Lavada Porteous, DO  Glucagon , rDNA, (GLUCAGON  EMERGENCY IJ) Inject 1 mg as directed as needed (blood sugar).     [provider]  ondansetron  (ZOFRAN -ODT) 4 MG disintegrating tablet Take 1 tablet (4 mg total) by mouth every 8 (eight) hours as needed for nausea or vomiting. 05/04/23  Twilla Galea, MD    Family History Family History  Problem Relation Age of Onset   Healthy Mother    Hypertension Maternal Grandmother     Social History Social History   Tobacco Use   Smoking status: Never   Smokeless tobacco: Never  Vaping Use   Vaping status: Never Used  Substance Use Topics   Alcohol use: No   Drug use: No     Allergies   Patient has no known allergies.   Review of Systems Review of Systems  Per HPI  Physical Exam Triage Vital Signs ED Triage Vitals  Encounter Vitals Group     BP 06/25/23 1448 105/72     Systolic BP Percentile --      Diastolic BP  Percentile --      Pulse Rate 06/25/23 1448 93     Resp 06/25/23 1448 16     Temp 06/25/23 1448 98 F (36.7 C)     Temp Source 06/25/23 1448 Oral     SpO2 06/25/23 1448 95 %     Weight --      Height --      Head Circumference --      Peak Flow --      Pain Score 06/25/23 1449 10     Pain Loc --      Pain Education --      Exclude from Growth Chart --    No data found.  Updated Vital Signs BP 105/72   Pulse 93   Temp 98 F (36.7 C) (Oral)   Resp 16   SpO2 95%   Visual Acuity Right Eye Distance:   Left Eye Distance:   Bilateral Distance:    Right Eye Near:   Left Eye Near:    Bilateral Near:     Physical Exam Vitals and nursing note reviewed.  Constitutional:      General: He is awake. He is not in acute distress.    Appearance: Normal appearance. He is well-developed and well-groomed. He is not ill-appearing.  Musculoskeletal:     Right upper arm: Tenderness present. No swelling, edema, deformity or bony tenderness.     Right elbow: No swelling or deformity. Decreased range of motion. Tenderness present.  Skin:    General: Skin is warm and dry.  Neurological:     Mental Status: He is alert.  Psychiatric:        Behavior: Behavior is cooperative.      UC Treatments / Results  Labs (all labs ordered are listed, but only abnormal results are displayed) Labs Reviewed - No data to display  EKG   Radiology DG Humerus Right Result Date: 06/25/2023 CLINICAL DATA:  Pain after fall. EXAM: RIGHT HUMERUS - 2+ VIEW; RIGHT ELBOW - COMPLETE 3+ VIEW COMPARISON:  None Available. FINDINGS: Humerus: Mildly displaced and comminuted proximal humeral fracture, dominant fracture plane through the surgical neck. No intra-articular involvement. Probable growth plate at the a chromium accounting for lucency. Mild soft tissue edema at the fracture site. Elbow: No fracture or dislocation. Normal alignment and joint spaces. No elbow joint effusion. IMPRESSION: 1. Mildly displaced  and comminuted proximal humeral fracture, dominant fracture plane through the surgical neck. 2. No fracture or dislocation of the elbow. Electronically Signed   By: Chadwick Colonel M.D.   On: 06/25/2023 17:27   DG Elbow Complete Right Result Date: 06/25/2023 CLINICAL DATA:  Pain after fall. EXAM: RIGHT HUMERUS - 2+ VIEW; RIGHT ELBOW - COMPLETE 3+ VIEW COMPARISON:  None Available. FINDINGS: Humerus: Mildly displaced and comminuted proximal humeral fracture, dominant fracture plane through the surgical neck. No intra-articular involvement. Probable growth plate at the a chromium accounting for lucency. Mild soft tissue edema at the fracture site. Elbow: No fracture or dislocation. Normal alignment and joint spaces. No elbow joint effusion. IMPRESSION: 1. Mildly displaced and comminuted proximal humeral fracture, dominant fracture plane through the surgical neck. 2. No fracture or dislocation of the elbow. Electronically Signed   By: Chadwick Colonel M.D.   On: 06/25/2023 17:27    Procedures Procedures (including critical care time)  Medications Ordered in UC Medications - No data to display  Initial Impression / Assessment and Plan / UC Course  I have reviewed the triage vital signs and the nursing notes.  Pertinent labs & imaging results that were available during my care of the patient were reviewed by me and considered in my medical decision making (see chart for details).     Patient is well-appearing.  Vitals are stable.  Upon assessment patient has tenderness to anterior right upper arm as well as tenderness surrounding right elbow joint.  Patient is unable to fully extend right elbow without pain.  Based on my interpretation of x-ray there is no acute fracture or underlying injury.  Radiology report suggest mildly displaced and comminuted proximal humeral fracture without any fracture or dislocation of the elbow.    Attempted to call patient 3 times to inform him of x-ray results.   Patient does not need to return for any splint and should continue to wear the sling at, but needs to follow-up with orthopedic attached to paperwork.  Provided patient with sling.  Prescribed Tylenol  ibuprofen  as needed for pain.  Discussed follow-up with orthopedic doctor.  Discussed return precautions. Final Clinical Impressions(s) / UC Diagnoses   Final diagnoses:  Fall, initial encounter  Right arm pain  Right elbow pain     Discharge Instructions      Alternate between Tylenol  and ibuprofen  every 6-8 hours as needed for pain. Otherwise alternate between ice and heat as needed for pain, and do some gentle stretching. I have attached  sports medicine that you can follow-up with if your pain persists. Return here as needed.    ED Prescriptions     Medication Sig Dispense Auth. Provider   acetaminophen  (TYLENOL ) 325 MG tablet Take 2 tablets (650 mg total) by mouth every 6 (six) hours as needed for mild pain (pain score 1-3) or moderate pain (pain score 4-6). 30 tablet Levora Reas A, NP   ibuprofen  (ADVIL ) 400 MG tablet Take 1 tablet (400 mg total) by mouth every 6 (six) hours as needed for mild pain (pain score 1-3) or moderate pain (pain score 4-6). 30 tablet Levora Reas A, NP      PDMP not reviewed this encounter.   Levora Reas A, NP 06/25/23 2051

## 2023-07-04 ENCOUNTER — Ambulatory Visit (INDEPENDENT_AMBULATORY_CARE_PROVIDER_SITE_OTHER): Payer: MEDICAID | Admitting: Family Medicine

## 2023-07-04 VITALS — BP 112/73 | HR 91 | Ht 69.0 in | Wt 206.4 lb

## 2023-07-04 DIAGNOSIS — M25572 Pain in left ankle and joints of left foot: Secondary | ICD-10-CM

## 2023-07-04 NOTE — Patient Instructions (Signed)
 Good to see you today - Thank you for coming in  Things we discussed today:  1) You have a history of an old injury in your left foot.  It is unclear when this injury happened, but it is now healed.  However, your left foot will be at higher risk of injuries in the future.  That is most likely why you keep injuring it so often.  2) to avoid injuries, I recommend being cautious with physical activity, especially involving your left ankle or foot.  Make sure you wear supportive, well-fitting shoes. - If you think you may have injured your foot, take a break and rest for a few minutes.  Apply ice to the injury to decrease swelling.  Sleep with a pillow under the left foot to help keep it elevated, this will also help with swelling.  3) for this injury, you can take ibuprofen  and Tylenol  as needed for the next 1 to 2 weeks.

## 2023-07-04 NOTE — Progress Notes (Signed)
    SUBJECTIVE:   CHIEF COMPLAINT / HPI:   Sean Kim is a 23 year old M with history of recurrent injuries of left ankle that presents for acute injury to left ankle. Mom also called during visit, and she helped to provide history. - Was playing on trampoline about 1-2 weeks ago (during Spring break), and fell and twisted his L ankle. - Also went out to play with friends in new shoes this past weekend, and then started to complain about more pain after that.  OBJECTIVE:   BP 112/73   Pulse 91   Ht 5\' 9"  (1.753 m)   Wt 206 lb 6 oz (93.6 kg)   SpO2 100%   BMI 30.48 kg/m   General: Alert, pleasant young man. NAD. HEENT: NCAT. MMM. CV: RRR, no murmurs.  Resp: CTAB, no wheezing or crackles. Normal WOB on RA.  Skin: Warm, well perfused  Ext: No tenderness to palpation of L ankle. Pain reproduced with ambulation, especially dorsiflexion of l ankle. Mild limp on L side.   L ankle and foot XR 11/04/22 1. No acute fracture or dislocation. 2. Unchanged moderate dorsal navicular degenerative spurring and adjacent 7 mm well corticated chronic ossicle dorsal to the talonavicular joint. 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   ASSESSMENT/PLAN:   Assessment & Plan Acute left ankle pain Has hx of reccurent injury to L ankle. Has hx of prior posttraumatic changes in ankle, which could be putting pt at risk of easier injuries. Reassuringly, pt is able to ambulate and pain is controlled. No imaging needed at this time.  - Conservative management with ibuprofen  and Tylenol  as needed, icing, elevation.  Recommended well-fitting shoes and caution with overexerting or injuring left ankle given history of injury.     Sean Huh, MD [facility]

## 2023-08-14 ENCOUNTER — Encounter: Payer: Self-pay | Admitting: *Deleted

## 2023-10-04 ENCOUNTER — Other Ambulatory Visit: Payer: Self-pay | Admitting: Family Medicine

## 2023-10-26 ENCOUNTER — Ambulatory Visit (INDEPENDENT_AMBULATORY_CARE_PROVIDER_SITE_OTHER): Payer: MEDICAID | Admitting: Family Medicine

## 2023-10-26 VITALS — BP 112/69 | HR 70 | Temp 99.1°F | Ht 69.0 in | Wt 227.1 lb

## 2023-10-26 DIAGNOSIS — T300 Burn of unspecified body region, unspecified degree: Secondary | ICD-10-CM | POA: Diagnosis not present

## 2023-10-26 NOTE — Progress Notes (Signed)
    SUBJECTIVE:   CHIEF COMPLAINT / HPI:   ED follow-up for burn on left thigh Using silver sulfadiazine ointment every day Has had improvement in pain No fevers, drainage, bleeding Feels the wound is healing well Denies additional concerns  PERTINENT  PMH / PSH: Pituitary hypoplasia, history of adrenal crisis, central hypothyroidism. On hydrocortisone  and synthroid   OBJECTIVE:   BP 112/69   Pulse 70   Temp 99.1 F (37.3 C) (Oral)   Ht 5' 9 (1.753 m)   Wt 227 lb 2 oz (103 kg)   SpO2 99%   BMI 33.54 kg/m   General: NAD, pleasant, able to participate in exam Respiratory: No respiratory distress Skin: Healing burn on the left lateral thigh, no drainage or bleeding.  See image below Psych: Normal affect and mood    ASSESSMENT/PLAN:   Assessment & Plan Burn Healing appropriately Routine f/u, discussed return precautions Continue silver sulfadiazine    Payton Coward, MD [facility]

## 2023-10-26 NOTE — Patient Instructions (Signed)
 Continue using the silver sulfadiazine cream daily until your wound heals  Please let us  know if you develop any worsening redness, swelling, fluid or pus drainage, bleeding, severe pain, or fevers

## 2023-11-06 ENCOUNTER — Ambulatory Visit: Payer: MEDICAID

## 2024-02-28 ENCOUNTER — Observation Stay (HOSPITAL_COMMUNITY)
Admission: EM | Admit: 2024-02-28 | Discharge: 2024-02-29 | Disposition: A | Discharge status: Home / Self Care | Payer: MEDICAID | Attending: Family Medicine | Admitting: Family Medicine

## 2024-02-28 ENCOUNTER — Other Ambulatory Visit: Payer: Self-pay

## 2024-02-28 ENCOUNTER — Encounter (HOSPITAL_COMMUNITY): Payer: Self-pay

## 2024-02-28 DIAGNOSIS — E039 Hypothyroidism, unspecified: Secondary | ICD-10-CM | POA: Diagnosis not present

## 2024-02-28 DIAGNOSIS — J101 Influenza due to other identified influenza virus with other respiratory manifestations: Secondary | ICD-10-CM | POA: Diagnosis present

## 2024-02-28 DIAGNOSIS — J111 Influenza due to unidentified influenza virus with other respiratory manifestations: Secondary | ICD-10-CM | POA: Diagnosis not present

## 2024-02-28 DIAGNOSIS — E271 Primary adrenocortical insufficiency: Principal | ICD-10-CM | POA: Insufficient documentation

## 2024-02-28 DIAGNOSIS — R110 Nausea: Secondary | ICD-10-CM | POA: Diagnosis present

## 2024-02-28 DIAGNOSIS — E272 Addisonian crisis: Secondary | ICD-10-CM | POA: Diagnosis present

## 2024-02-28 DIAGNOSIS — E2740 Unspecified adrenocortical insufficiency: Secondary | ICD-10-CM

## 2024-02-28 DIAGNOSIS — Z789 Other specified health status: Secondary | ICD-10-CM

## 2024-02-28 HISTORY — DX: Weakness: R53.1

## 2024-02-28 HISTORY — DX: Other cerebral infarction: I63.89

## 2024-02-28 HISTORY — DX: Chronic pansinusitis: J32.4

## 2024-02-28 LAB — COMPREHENSIVE METABOLIC PANEL WITH GFR
ALT: 24 U/L (ref 0–44)
AST: 91 U/L — ABNORMAL HIGH (ref 15–41)
Albumin: 4.6 g/dL (ref 3.5–5.0)
Alkaline Phosphatase: 88 U/L (ref 38–126)
Anion gap: 15 (ref 5–15)
BUN: 13 mg/dL (ref 6–20)
CO2: 21 mmol/L — ABNORMAL LOW (ref 22–32)
Calcium: 9 mg/dL (ref 8.9–10.3)
Chloride: 102 mmol/L (ref 98–111)
Creatinine, Ser: 1.34 mg/dL — ABNORMAL HIGH (ref 0.61–1.24)
GFR, Estimated: >60 mL/min
Glucose, Bld: 83 mg/dL (ref 70–99)
Potassium: 3.4 mmol/L — ABNORMAL LOW (ref 3.5–5.1)
Sodium: 137 mmol/L (ref 135–145)
Total Bilirubin: 0.9 mg/dL (ref 0.0–1.2)
Total Protein: 8.3 g/dL — ABNORMAL HIGH (ref 6.5–8.1)

## 2024-02-28 LAB — CBC WITH DIFFERENTIAL/PLATELET
Abs Immature Granulocytes: 0.03 K/uL (ref 0.00–0.07)
Basophils Absolute: 0 K/uL (ref 0.0–0.1)
Basophils Relative: 0 %
Eosinophils Absolute: 0.1 K/uL (ref 0.0–0.5)
Eosinophils Relative: 1 %
HCT: 36.3 % — ABNORMAL LOW (ref 39.0–52.0)
Hemoglobin: 12.1 g/dL — ABNORMAL LOW (ref 13.0–17.0)
Immature Granulocytes: 0 %
Lymphocytes Relative: 22 %
Lymphs Abs: 1.8 K/uL (ref 0.7–4.0)
MCH: 27.2 pg (ref 26.0–34.0)
MCHC: 33.3 g/dL (ref 30.0–36.0)
MCV: 81.6 fL (ref 80.0–100.0)
Monocytes Absolute: 0.7 K/uL (ref 0.1–1.0)
Monocytes Relative: 8 %
Neutro Abs: 5.5 K/uL (ref 1.7–7.7)
Neutrophils Relative %: 69 %
Platelets: 232 K/uL (ref 150–400)
RBC: 4.45 MIL/uL (ref 4.22–5.81)
RDW: 16.8 % — ABNORMAL HIGH (ref 11.5–15.5)
WBC: 8.1 K/uL (ref 4.0–10.5)
nRBC: 0 % (ref 0.0–0.2)

## 2024-02-28 LAB — RESP PANEL BY RT-PCR (RSV, FLU A&B, COVID)  RVPGX2
Influenza A by PCR: POSITIVE — AB
Influenza B by PCR: NEGATIVE
Resp Syncytial Virus by PCR: NEGATIVE
SARS Coronavirus 2 by RT PCR: NEGATIVE

## 2024-02-28 LAB — LIPASE, BLOOD: Lipase: 26 U/L (ref 11–51)

## 2024-02-28 LAB — URINALYSIS, ROUTINE W REFLEX MICROSCOPIC
Bilirubin Urine: NEGATIVE
Glucose, UA: NEGATIVE mg/dL
Hgb urine dipstick: NEGATIVE
Ketones, ur: 20 mg/dL — AB
Leukocytes,Ua: NEGATIVE
Nitrite: NEGATIVE
Protein, ur: NEGATIVE mg/dL
Specific Gravity, Urine: 1.01 (ref 1.005–1.030)
pH: 5 (ref 5.0–8.0)

## 2024-02-28 LAB — CORTISOL: Cortisol, Plasma: 49.2 ug/dL

## 2024-02-28 LAB — I-STAT CG4 LACTIC ACID, ED: Lactic Acid, Venous: 1.1 mmol/L (ref 0.5–1.9)

## 2024-02-28 LAB — CBG MONITORING, ED: Glucose-Capillary: 84 mg/dL (ref 70–99)

## 2024-02-28 LAB — TSH: TSH: 1.91 u[IU]/mL (ref 0.350–4.500)

## 2024-02-28 MED ORDER — LACTATED RINGERS IV BOLUS
1000.0000 mL | Freq: Once | INTRAVENOUS | Status: AC
  Administered 2024-02-28: 1000 mL via INTRAVENOUS

## 2024-02-28 MED ORDER — POTASSIUM CHLORIDE CRYS ER 20 MEQ PO TBCR
40.0000 meq | EXTENDED_RELEASE_TABLET | Freq: Once | ORAL | Status: AC
  Administered 2024-02-28: 40 meq via ORAL
  Filled 2024-02-28: qty 2

## 2024-02-28 MED ORDER — ENOXAPARIN SODIUM 40 MG/0.4ML IJ SOSY
40.0000 mg | PREFILLED_SYRINGE | INTRAMUSCULAR | Status: DC
  Administered 2024-02-28: 40 mg via SUBCUTANEOUS
  Filled 2024-02-28: qty 0.4

## 2024-02-28 MED ORDER — LEVOTHYROXINE SODIUM 25 MCG PO TABS: 137.0000 ug | ORAL_TABLET | Freq: Every morning | ORAL | Status: DC

## 2024-02-28 MED ORDER — ACETAMINOPHEN 650 MG RE SUPP: 650.0000 mg | Freq: Four times a day (QID) | RECTAL | Status: DC | PRN

## 2024-02-28 MED ORDER — DEXAMETHASONE SODIUM PHOSPHATE 4 MG/ML IJ SOLN
4.0000 mg | Freq: Once | INTRAMUSCULAR | Status: AC
  Administered 2024-02-28: 4 mg via INTRAVENOUS
  Filled 2024-02-28: qty 1

## 2024-02-28 MED ORDER — HYDROCORTISONE SOD SUC (PF) 100 MG IJ SOLR
100.0000 mg | Freq: Two times a day (BID) | INTRAMUSCULAR | Status: DC
  Administered 2024-02-28: 100 mg via INTRAVENOUS
  Filled 2024-02-28 (×2): qty 2

## 2024-02-28 MED ORDER — OSELTAMIVIR PHOSPHATE 75 MG PO CAPS
75.0000 mg | ORAL_CAPSULE | Freq: Two times a day (BID) | ORAL | Status: DC
  Administered 2024-02-28 – 2024-02-29 (×2): 75 mg via ORAL
  Filled 2024-02-28 (×3): qty 1

## 2024-02-28 MED ORDER — LEVOTHYROXINE SODIUM 125 MCG PO TABS
125.0000 ug | ORAL_TABLET | Freq: Every day | ORAL | Status: DC
  Administered 2024-02-29: 125 ug via ORAL
  Filled 2024-02-28: qty 1

## 2024-02-28 MED ORDER — HYDROCORTISONE SOD SUC (PF) 100 MG IJ SOLR: 50.0000 mg | Freq: Four times a day (QID) | INTRAMUSCULAR | Status: DC

## 2024-02-28 MED ORDER — LACTATED RINGERS IV BOLUS
500.0000 mL | Freq: Once | INTRAVENOUS | Status: AC
  Administered 2024-02-28: 500 mL via INTRAVENOUS

## 2024-02-28 MED ORDER — ACETAMINOPHEN 325 MG PO TABS
650.0000 mg | ORAL_TABLET | Freq: Once | ORAL | Status: AC
  Administered 2024-02-28: 650 mg via ORAL
  Filled 2024-02-28: qty 2

## 2024-02-28 MED ORDER — LACTATED RINGERS IV SOLN: INTRAVENOUS | Status: DC

## 2024-02-28 MED ORDER — ONDANSETRON HCL 4 MG/2ML IJ SOLN
4.0000 mg | Freq: Once | INTRAMUSCULAR | Status: AC
  Administered 2024-02-28: 4 mg via INTRAVENOUS
  Filled 2024-02-28: qty 2

## 2024-02-28 MED ORDER — ONDANSETRON 4 MG PO TBDP
4.0000 mg | ORAL_TABLET | Freq: Three times a day (TID) | ORAL | Status: DC | PRN
  Administered 2024-02-29: 4 mg via ORAL
  Filled 2024-02-28: qty 1

## 2024-02-28 MED ORDER — IBUPROFEN 200 MG PO TABS
400.0000 mg | ORAL_TABLET | Freq: Four times a day (QID) | ORAL | Status: DC | PRN
  Administered 2024-02-29: 400 mg via ORAL
  Filled 2024-02-28: qty 2

## 2024-02-28 MED ORDER — ACETAMINOPHEN 325 MG PO TABS: 650.0000 mg | ORAL_TABLET | Freq: Four times a day (QID) | ORAL | Status: DC | PRN

## 2024-02-28 MED ORDER — HYDROCORTISONE SOD SUC (PF) 100 MG IJ SOLR
100.0000 mg | Freq: Once | INTRAMUSCULAR | Status: AC
  Administered 2024-02-28: 100 mg via INTRAVENOUS
  Filled 2024-02-28: qty 2

## 2024-02-28 NOTE — Assessment & Plan Note (Signed)
 Had Tmax 101.2 yesterday, responded to tylenol .  - Cont Tamiflu  75 mg BID x 5 days (12/25 - 12/29) - Ibuprofen  400 mg q6h PRN and Tylenol  1000 mg q6h PRN for fevers, chills, and body aches - Zofran -ODT 4 mg q8h PRN for N/V

## 2024-02-28 NOTE — Progress Notes (Incomplete)
" ° ° ° °  Daily Progress Note Intern Pager: [phone]  Patient name: Sean Kim Medical record number: [id number] Date of birth: 18-Jan-2001 Age: 23 y.o. Gender: male  Primary Care Provider: Alba Sharper, MD Consultants: None Code Status: Full  Pt Overview and Major Events to Date:  12/25: Admitted  Assessment and Plan: 23 year old M with history of adrenal insufficiency, congenital hypothyroidism, developmental delay that is admitted for adrenal crisis in the setting of flu A infection.  Assessment & Plan Adrenal crisis BP stable. Electrolytes and glucose stable. Tolerating PO.     - Home regimen: Hydrocortisone  15mg  AM, 5mg  PM. Double dose for sick days.       Patient presents with N/V/D/abdominal pain, dehydration, hypotension, and febrile likely 2/2 to Influenza A.  Missed Cortef  doses for the past week.  Received IV Decadron  4 mg, IV Solu-Cortef  100 mg, and 2.5L LR with persistent hypotension. - Admit to FMTS, attending Dr. McDiarmid - Med-Tele, Vital signs per floor - Regular diet, advance as tolerated  - VTE prophylaxis: Lovenox  - LR at 140 mL/hr for mIVF - IV Solu-Cortef  100 mg q12h for the first 24 hours (per pharmacy dosing) - Labs: ACTH , cortisol, and TSH - AM CBC, CMP, ACTH , plasma cortisol Influenza A Had Tmax ***, responded to tylenol .  - Cont Tamiflu  75 mg BID x 5 days (12/25 - 12/29) - Ibuprofen  400 mg q6h PRN and Tylenol  1000 mg q6h PRN for fevers, chills, and body aches - Zofran -ODT 4 mg q8h PRN for N/V Chronic health problem Central hypothyroidism: Continue home Synthroid  137 mcg daily        FEN/GI: Regular PPx: Lovenox  Dispo: Pending clinical improvement.  Subjective:  ***  Objective: Temp:  [98.8 F (37.1 C)-101.3 F (38.5 C)] 98.8 F (37.1 C) (12/25 1828) Pulse Rate:  [88-109] 88 (12/25 1828) Resp:  [14-29] 26 (12/25 1800) BP: (76-113)/(48-85) 104/57 (12/25 1828) SpO2:  [91 %-100 %] 100 % (12/25 1828) Physical Exam: General:  *** Cardiovascular: *** Respiratory: *** Abdomen: *** Extremities: ***  Laboratory: Most recent CBC Lab Results  Component Value Date   WBC 8.1 02/28/2024   HGB 12.1 (L) 02/28/2024   HCT 36.3 (L) 02/28/2024   MCV 81.6 02/28/2024   PLT 232 02/28/2024   Most recent BMP    Latest Ref Rng & Units 02/28/2024   11:22 AM  BMP  Glucose 70 - 99 mg/dL 83   BUN 6 - 20 mg/dL 13   Creatinine 9.38 - 1.24 mg/dL 8.65   Sodium 864 - 854 mmol/L 137   Potassium 3.5 - 5.1 mmol/L 3.4   Chloride 98 - 111 mmol/L 102   CO2 22 - 32 mmol/L 21   Calcium 8.9 - 10.3 mg/dL 9.0      Elicia Hamlet, MD 02/28/2024, 8:23 PM  PGY-3, [facility] FPTS Intern pager: [phone], text pages welcome Secure chat group [facility] Teaching Service   "

## 2024-02-28 NOTE — Assessment & Plan Note (Addendum)
 Central hypothyroidism: Continue home Synthroid  137 mcg daily

## 2024-02-28 NOTE — Assessment & Plan Note (Addendum)
 Patient presents with N/V/D/abdominal pain, dehydration, hypotension, and febrile likely 2/2 to Influenza A.  Missed Cortef  doses for the past week.  Received IV Decadron  4 mg, IV Solu-Cortef  100 mg, and 2.5L LR with persistent hypotension. - Admit to FMTS, attending Dr. McDiarmid - Med-Tele, Vital signs per floor - Regular diet, advance as tolerated  - VTE prophylaxis: Lovenox  - LR at 140 mL/hr for mIVF - IV Solu-Cortef  100 mg q12h for the first 24 hours (per pharmacy dosing) - Labs: ACTH , cortisol, and TSH - AM CBC, CMP, ACTH , plasma cortisol

## 2024-02-28 NOTE — ED Notes (Addendum)
 Pt had large amount of diarrhea. RN cleaned pt, peri care performed. New Bed sheets and chuck pad applied. MD notified.

## 2024-02-28 NOTE — Plan of Care (Signed)

## 2024-02-28 NOTE — ED Notes (Signed)
MD notified of fever

## 2024-02-28 NOTE — Assessment & Plan Note (Addendum)
 Flu symptoms since yesterday, febrile and tachycardic.  Respiratory panel positive for Influenza A. - Tamiflu  75 mg BID x 5 days (12/25 - 12/29) - Ibuprofen  400 mg q6h PRN and Tylenol  1000 mg q6h PRN for fevers, chills, and body aches - Zofran -ODT 4 mg q8h PRN for N/V

## 2024-02-28 NOTE — Plan of Care (Signed)
 FMTS Interim Progress Note  S:Night rounded on pt. Pt reports feeling better than he was when first admitted.  O: BP (!) 104/57 (BP Location: Left Arm)   Pulse 88   Temp 98.8 F (37.1 C)   Resp (!) 26   Ht 5' 9 (1.753 m)   SpO2 100%   BMI 33.54 kg/m   Gen: Pt laying comfortably in bed watching phone and eating dinner. NAD. Speaking comfortably in full sentences on RA.  A/P:  Adrenal Crisis Symptomatically improving, VSS. Rest of plan per H&P.  Elicia Hamlet, MD 02/28/2024, 7:46 PM PGY-3, [facility] Family Medicine Service pager [phone]

## 2024-02-28 NOTE — Progress Notes (Signed)
 IV Team rec'd a STAT consult for IV access;  both arms assessed thoroughly with ultrasound; very poor access, however , was able to place a 20g in the RAFA;  pt stated I've had IVs in my neck before;  Labs drawn and given to RN;

## 2024-02-28 NOTE — ED Triage Notes (Signed)
 Pt here for nausea/vomiting/ dizziness/abd pain/ diarrhea that started yesterday. Pt has not taken adrenal medication in a week. Axox4. BP 99/70.

## 2024-02-28 NOTE — H&P (Cosign Needed Addendum)
 "    Hospital Admission History and Physical Service Pager: [phone]  Patient name: Sean Kim Medical record number: [id number] Date of Birth: Apr 06, 2000 Age: 23 y.o. Gender: male  Primary Care Provider: Alba Sharper, MD Consultants: None Code Status: Full Code Preferred Emergency Contact:  Contact Information     Name Relation Home Work Mobile   Sean Kim Mother [phone]  [phone]   Sean Kim,Sean Kim Father [phone]     Chief Complaint: Viral symptoms  Differential and Medical Decision Making:  Sean Kim is a 23 y.o. male with PMH of adrenal insufficiency, congential hypothyroidism, developmental delay presenting with viral symptoms of nausea, vomiting, dizziness, abdominal pain, and diarrhea which started yesterday.  Patient has misplaced his home Cortef  and Synthroid  for the last week.   Differential for this patient's presentation of this includes: Adrenal crisis - most likely secondary to missing his steroids for the past week and having N/V/D/abdominal pain, hypotension, and is febrile. Has had multiple admissions for adrenal insuffiencey and crisis in the past.  Influenza infection - viral symptoms for 1 day and respiratory panel positive for influenza A Viral gastroenteritis - possible with nausea/vomiting/diarrhea along with viral cause Assessment & Plan Adrenal crisis Patient presents with N/V/D/abdominal pain, dehydration, hypotension, and febrile likely 2/2 to Influenza A.  Missed Cortef  doses for the past week.  Received IV Decadron  4 mg, IV Solu-Cortef  100 mg, and 2.5L LR with persistent hypotension. - Admit to FMTS, attending Dr. McDiarmid - Med-Tele, Vital signs per floor - Regular diet, advance as tolerated  - VTE prophylaxis: Lovenox  - LR at 140 mL/hr for mIVF - IV Solu-Cortef  100 mg q12h for the first 24 hours (per pharmacy dosing) - Labs: ACTH , cortisol, and TSH - AM CBC, CMP, ACTH , plasma cortisol Influenza A Flu symptoms since  yesterday, febrile and tachycardic.  Respiratory panel positive for Influenza A. - Tamiflu  75 mg BID x 5 days (12/25 - 12/29) - Ibuprofen  400 mg q6h PRN and Tylenol  1000 mg q6h PRN for fevers, chills, and body aches - Zofran -ODT 4 mg q8h PRN for N/V Chronic health problem Central hypothyroidism: Continue home Synthroid  137 mcg daily  FEN/GI: Regular diet VTE Prophylaxis: Lovenox   Disposition: Med-Tele  History of Present Illness:  Sean Kim is a 23 y.o. male presenting with viral symptoms (N/V/D/abdominal pain, rhinorrhea, congestion, sore throat, and generalized malaise).  Symptoms began 12/24.  Patient misplaced his Cortef  this week, found them yesterday, but could not keep them down due to the nausea and vomiting.  Denies any sick contacts.  Otherwise has mild non-radiating chest pain from cough and headache.  In the ED, patient was febrile (Tmax 101.3), hypotensive (85/58) with MAPs >65, tachycardic in the low 100s, and tachypneic.  Patient was given 2.5 L LR, potassium, IV Decadron  4 mg, IV Zofran  4 mg, and Solu-Cortef  100 mg  Review Of Systems: as above  Pertinent Past Medical History: Adrenal insufficiency Developmental delay Seizures Thyroid disease Remainder reviewed in history tab.   Pertinent Past Surgical History: Tonsillectomy Eye surgery Remainder reviewed in history tab.   Pertinent Social History: Tobacco use: No Alcohol use: No Other Substance use: No Lives with mom, dad, brother, and sister  Pertinent Family History: None   Important Outpatient Medications: Cortef  15 mg AM, 5 mg PM; doubled when sick - last week Levothyroxine  137 mcg daily - last week  Objective: BP (!) 89/52   Pulse 92   Temp 99.8 F (37.7 C) (Oral)   Resp (!) 22   Ht 5' 9 (  1.753 m)   SpO2 95%   BMI 33.54 kg/m  Exam: General: Awake and Alert in NAD HEENT: NCAT. Sclera anicteric. No rhinorrhea. Cardiovascular: Tachycardic. No M/R/G Respiratory: CTAB, normal WOB on RA.  Diffuse crackles. Abdomen: Soft, non-tender, non-distended. Bowel sounds normoactive Extremities: No BLE edema, no deformities or significant joint findings. Skin: Warm and dry. Neuro: No focal neurological deficits.  Labs:  CBC BMET  Recent Labs  Lab 02/28/24 1122  WBC 8.1  HGB 12.1*  HCT 36.3*  PLT 232   Recent Labs  Lab 02/28/24 1122  NA 137  K 3.4*  CL 102  CO2 21*  BUN 13  CREATININE 1.34*  GLUCOSE 83  CALCIUM 9.0     Lipase: 26 Respiratory panel: Positive for influenza A Lactic acid: 1.1  EKG: Sinus tach, rate 100; QTc 435  Imaging Studies Performed: None  Sean Ferrier, DO 02/28/2024, 5:10 PM PGY-2, [facility]  FPTS Intern pager: [phone], text pages welcome Secure chat group [facility] Teaching Service  "

## 2024-02-28 NOTE — Assessment & Plan Note (Signed)
 BP stable. Electrolytes and glucose stable. Tolerating PO.     - Home regimen: Hydrocortisone  15mg  AM, 5mg  PM. Double dose for sick days.       Patient presents with N/V/D/abdominal pain, dehydration, hypotension, and febrile likely 2/2 to Influenza A.  Missed Cortef  doses for the past week.  Received IV Decadron  4 mg, IV Solu-Cortef  100 mg, and 2.5L LR with persistent hypotension. - Admit to FMTS, attending Dr. McDiarmid - Med-Tele, Vital signs per floor - Regular diet, advance as tolerated  - VTE prophylaxis: Lovenox  - LR at 140 mL/hr for mIVF - IV Solu-Cortef  100 mg q12h for the first 24 hours (per pharmacy dosing) - Labs: ACTH , cortisol, and TSH - AM CBC, CMP, ACTH , plasma cortisol

## 2024-02-28 NOTE — ED Provider Notes (Signed)
 " [place] EMERGENCY DEPARTMENT AT [facility] Provider Note   CSN: [id number] Arrival date & time: 02/28/24  1035     Patient presents with: Nausea and Dizziness   Sean Kim is a 23 y.o. male.   This is a 23 year old male presenting emergency department with viral syndrome type complaints.  Symptoms started yesterday.  Complaining of rhinorrhea congestion sore throat cough generalized malaise as well as nausea vomiting diarrhea.  Symptoms started yesterday.  Has not been able to keep down home medications.  Does have a history of MR as well as adrenal insufficiency   Dizziness      Prior to Admission medications  Medication Sig Start Date End Date Taking? Authorizing Provider  acetaminophen  (TYLENOL ) 325 MG tablet Take 2 tablets (650 mg total) by mouth every 6 (six) hours as needed for mild pain (pain score 1-3) or moderate pain (pain score 4-6). 06/25/23   Johnie Flaming A, NP  Glucagon , rDNA, (GLUCAGON  EMERGENCY IJ) Inject 1 mg as directed as needed (blood sugar).     [provider]  hydrocortisone  (CORTEF ) 10 MG tablet Take 5 mg by mouth daily. Take 15mg  (3 tablets) in the morning and 5mg  (1 tablet in the afternoon). Take double doses on sick days.    [provider]  ibuprofen  (ADVIL ) 400 MG tablet Take 1 tablet (400 mg total) by mouth every 6 (six) hours as needed for mild pain (pain score 1-3) or moderate pain (pain score 4-6). 06/25/23   Johnie Flaming A, NP  levothyroxine  (SYNTHROID ) 137 MCG tablet Take 137 mcg by mouth every morning. 03/13/19   [provider]  ondansetron  (ZOFRAN -ODT) 4 MG disintegrating tablet Take 1 tablet (4 mg total) by mouth every 8 (eight) hours as needed for nausea or vomiting. 05/04/23   Willo Dunnings, MD  polyethylene glycol powder (GLYCOLAX /MIRALAX ) 17 GM/SCOOP powder Take 1 capful (17 g) by mouth daily. 05/25/23   Howell Lunger, DO    Allergies: Patient has no known allergies.    Review of Systems   Neurological:  Positive for dizziness.    Updated Vital Signs BP (!) 93/53   Pulse (!) 102   Temp (!) 101.2 F (38.4 C) (Oral)   Resp (!) 21   Ht 5' 9 (1.753 m)   SpO2 96%   BMI 33.54 kg/m   Physical Exam Vitals and nursing note reviewed.  Constitutional:      Appearance: He is obese.  HENT:     Nose: Nose normal.     Mouth/Throat:     Mouth: Mucous membranes are moist.  Eyes:     Conjunctiva/sclera: Conjunctivae normal.  Cardiovascular:     Rate and Rhythm: Regular rhythm. Tachycardia present.  Pulmonary:     Effort: Pulmonary effort is normal.  Abdominal:     General: Abdomen is flat. There is no distension.     Tenderness: There is no abdominal tenderness. There is no guarding or rebound.  Musculoskeletal:        General: Normal range of motion.  Skin:    General: Skin is warm and dry.     Capillary Refill: Capillary refill takes less than 2 seconds.  Neurological:     Mental Status: He is alert and oriented to person, place, and time.  Psychiatric:        Mood and Affect: Mood normal.        Behavior: Behavior normal.     (all labs ordered are listed, but only abnormal results are  displayed) Labs Reviewed  RESP PANEL BY RT-PCR (RSV, FLU A&B, COVID)  RVPGX2 - Abnormal; Notable for the following components:      Result Value   Influenza A by PCR POSITIVE (*)    All other components within normal limits  CBC WITH DIFFERENTIAL/PLATELET - Abnormal; Notable for the following components:   Hemoglobin 12.1 (*)    HCT 36.3 (*)    RDW 16.8 (*)    All other components within normal limits  COMPREHENSIVE METABOLIC PANEL WITH GFR - Abnormal; Notable for the following components:   Potassium 3.4 (*)    CO2 21 (*)    Creatinine, Ser 1.34 (*)    Total Protein 8.3 (*)    AST 91 (*)    All other components within normal limits  CULTURE, BLOOD (ROUTINE X 2)  CULTURE, BLOOD (ROUTINE X 2)  LIPASE, BLOOD  URINALYSIS, ROUTINE W REFLEX MICROSCOPIC  CBG MONITORING, ED   I-STAT CG4 LACTIC ACID, ED    EKG: EKG Interpretation Date/Time:  Thursday February 28 2024 12:09:36 EST Ventricular Rate:  100 PR Interval:  123 QRS Duration:  134 QT Interval:  337 QTC Calculation: 435 R Axis:   51  Text Interpretation: Sinus tachycardia Right bundle branch block Repol abnrm suggests ischemia, diffuse leads Confirmed by Neysa Clap [phone]) on 02/28/2024 12:15:28 PM  Radiology: No results found.   .Critical Care  Performed by: Neysa Clap PARAS, DO Authorized by: Neysa Clap PARAS, DO   Critical care provider statement:    Critical care time (minutes):  30   Critical care was necessary to treat or prevent imminent or life-threatening deterioration of the following conditions:  Shock   Critical care was time spent personally by me on the following activities:  Development of treatment plan with patient or surrogate, discussions with consultants, evaluation of patient's response to treatment, examination of patient, ordering and review of laboratory studies, ordering and review of radiographic studies, ordering and performing treatments and interventions, pulse oximetry, re-evaluation of patient's condition and review of old charts    Medications Ordered in the ED  lactated ringers  bolus 1,000 mL (0 mLs Intravenous Stopped 02/28/24 1337)  dexamethasone  (DECADRON ) injection 4 mg (4 mg Intravenous Given 02/28/24 1210)  ondansetron  (ZOFRAN ) injection 4 mg (4 mg Intravenous Given 02/28/24 1244)  potassium chloride  SA (KLOR-CON  M) CR tablet 40 mEq (40 mEq Oral Given 02/28/24 1238)  lactated ringers  bolus 1,000 mL (0 mLs Intravenous Stopped 02/28/24 1530)  hydrocortisone  sodium succinate  (SOLU-CORTEF ) 100 MG injection 100 mg (100 mg Intravenous Given 02/28/24 1355)  lactated ringers  bolus 500 mL (500 mLs Intravenous New Bag/Given 02/28/24 1531)    Clinical Course as of 02/28/24 1546  Thu Feb 28, 2024  1214 Lactic Acid, Venous: 1.1 [TY]  1214 CBC with  Differential(!) No leukocytosis.  Minor anemia noted.  Improved compared to prior [TY]  1324 Influenza A By PCR(!): POSITIVE [TY]    Clinical Course User Index [TY] Neysa Clap PARAS, DO                                 Medical Decision Making 23 year old male presenting emergency department for nausea vomiting diarrhea.  History of adrenal insufficiency and intellectual disability.  Lost his steroids and has not had them for the past week.  Started having nausea vomiting diarrhea yesterday.  He found his steroids was unable to keep them down today.  He is febrile, tacky, soft  blood pressures.  Given Decadron  and subsequently Solu-Cortef  given persistent hypotension.  Blood pressure seems to be somewhat improving with IV fluids and steroids.  His markers for sepsis reassuring no leukocytosis lactate not elevated.  He is flu positive which would explain his symptoms.  Has a benign abdominal exam.  Low suspicion for acute intra-abdominal pathology.  Potassium 3.4.  Repleted.  Given patient's adrenal insufficiency, persistent nausea vomiting diarrhea and soft blood pressures.  Will admit for adrenal insufficiency/crisis secondary to influenza and medication noncompliance  Amount and/or Complexity of Data Reviewed Independent Historian: parent    Details: Noted that he lost his prescription, but found it this morning External Data Reviewed:     Details: Takes 10 mg of Cortef  Labs: ordered. Decision-making details documented in ED Course.    Details: See above Radiology:     Details: Considered CT abdomen, however symptoms fit a viral clinical picture and does have benign abdomen.  Labs could be lower suspicion for acute surgical pathology causing his symptoms today. ECG/medicine tests: ordered and independent interpretation performed.    Details: EKG sinus tachycardia, independent review  Risk Prescription drug management. Decision regarding hospitalization. Diagnosis or treatment significantly  limited by social determinants of health. Risk Details: Poor health literacy        Final diagnoses:  Influenza  Adrenal insufficiency    ED Discharge Orders     None          Neysa Caron PARAS, DO 02/28/24 1546  "

## 2024-02-28 NOTE — Assessment & Plan Note (Signed)
 Central hypothyroidism: Continue home Synthroid  137 mcg daily

## 2024-02-28 NOTE — Hospital Course (Addendum)
 Sean Kim is a 23 y.o.male with a history of developmental delay, adrenal insufficiency, hypothyroidism who was admitted to the [facility] Medicine Teaching Service at [facility] for possible adrenal crisis. His hospital course is detailed below:  Adrenal Crisis Patient presents with N/V/D/abdominal pain, dehydration, hypotension, and febrile likely 2/2 to Influenza A.  Missed Cortef  doses for the past week. Received IV Decadron  4 mg, IV Solu-Cortef  100 mg, and 2.5L LR with persistent hypotension in the ED.  Continued on IV Solu-Cortef  for about 24hrs. He remained stable on this. He was discharged on 50mg  PO hydrocortisone  daily x 4 days then instructed to go back to his regular home dose afterwards.  Influenza A Flu symptoms since 12/24, febrile and tachycardic.  Respiratory panel positive for Influenza A.  Treated with Tamiflu  75 mg x 5 days (12/25 - 12/29) and supportive care.  Other chronic conditions were medically managed with home medications and formulary alternatives as necessary (Central hypothyroidism)  PCP Follow-up Recommendations: Ensure adherence to Cortef  regimen - should switch back to home regimen (15mg  qAM and 5mg  qPM) on 12/31 after completing 4 day course of 50mg  daily

## 2024-02-29 ENCOUNTER — Other Ambulatory Visit (HOSPITAL_COMMUNITY): Payer: Self-pay

## 2024-02-29 ENCOUNTER — Encounter (HOSPITAL_COMMUNITY): Payer: Self-pay | Admitting: Family Medicine

## 2024-02-29 DIAGNOSIS — J101 Influenza due to other identified influenza virus with other respiratory manifestations: Secondary | ICD-10-CM

## 2024-02-29 DIAGNOSIS — E271 Primary adrenocortical insufficiency: Principal | ICD-10-CM

## 2024-02-29 DIAGNOSIS — E272 Addisonian crisis: Secondary | ICD-10-CM

## 2024-02-29 DIAGNOSIS — G319 Degenerative disease of nervous system, unspecified: Secondary | ICD-10-CM | POA: Insufficient documentation

## 2024-02-29 LAB — COMPREHENSIVE METABOLIC PANEL WITH GFR
ALT: 23 U/L (ref 0–44)
AST: 74 U/L — ABNORMAL HIGH (ref 15–41)
Albumin: 4.3 g/dL (ref 3.5–5.0)
Alkaline Phosphatase: 68 U/L (ref 38–126)
Anion gap: 11 (ref 5–15)
BUN: 13 mg/dL (ref 6–20)
CO2: 23 mmol/L (ref 22–32)
Calcium: 9.1 mg/dL (ref 8.9–10.3)
Chloride: 104 mmol/L (ref 98–111)
Creatinine, Ser: 0.92 mg/dL (ref 0.61–1.24)
GFR, Estimated: >60 mL/min
Glucose, Bld: 138 mg/dL — ABNORMAL HIGH (ref 70–99)
Potassium: 3.8 mmol/L (ref 3.5–5.1)
Sodium: 138 mmol/L (ref 135–145)
Total Bilirubin: 0.6 mg/dL (ref 0.0–1.2)
Total Protein: 7.4 g/dL (ref 6.5–8.1)

## 2024-02-29 LAB — CBC
HCT: 30.2 % — ABNORMAL LOW (ref 39.0–52.0)
Hemoglobin: 10.1 g/dL — ABNORMAL LOW (ref 13.0–17.0)
MCH: 26.9 pg (ref 26.0–34.0)
MCHC: 33.4 g/dL (ref 30.0–36.0)
MCV: 80.5 fL (ref 80.0–100.0)
Platelets: 214 K/uL (ref 150–400)
RBC: 3.75 MIL/uL — ABNORMAL LOW (ref 4.22–5.81)
RDW: 16.7 % — ABNORMAL HIGH (ref 11.5–15.5)
WBC: 10.9 K/uL — ABNORMAL HIGH (ref 4.0–10.5)
nRBC: 0 % (ref 0.0–0.2)

## 2024-02-29 LAB — CORTISOL-AM, BLOOD: Cortisol - AM: 98.3 ug/dL — ABNORMAL HIGH (ref 6.7–22.6)

## 2024-02-29 MED ORDER — HYDROCORTISONE 5 MG PO TABS
ORAL_TABLET | ORAL | 0 refills | Status: DC
  Filled 2024-02-29: qty 120, 30d supply, fill #0

## 2024-02-29 MED ORDER — HYDROCORTISONE 10 MG PO TABS
50.0000 mg | ORAL_TABLET | Freq: Every day | ORAL | 0 refills | Status: DC
  Filled 2024-02-29: qty 20, 4d supply, fill #0

## 2024-02-29 MED ORDER — HYDROCORTISONE 20 MG PO TABS: 50.0000 mg | ORAL_TABLET | Freq: Every day | ORAL | Status: DC

## 2024-02-29 MED ORDER — OSELTAMIVIR PHOSPHATE 75 MG PO CAPS
75.0000 mg | ORAL_CAPSULE | Freq: Two times a day (BID) | ORAL | 0 refills | Status: AC
  Filled 2024-02-29: qty 8, 4d supply, fill #0

## 2024-02-29 MED ORDER — HYDROCORTISONE 5 MG PO TABS: ORAL_TABLET | ORAL | Status: DC

## 2024-02-29 MED ORDER — HYDROCORTISONE SOD SUC (PF) 100 MG IJ SOLR: 50.0000 mg | Freq: Two times a day (BID) | INTRAMUSCULAR | Status: DC

## 2024-02-29 MED ORDER — HYDROCORTISONE SOD SUC (PF) 100 MG IJ SOLR
50.0000 mg | Freq: Two times a day (BID) | INTRAMUSCULAR | Status: DC
  Administered 2024-02-29: 50 mg via INTRAVENOUS
  Filled 2024-02-29: qty 1

## 2024-02-29 NOTE — Progress Notes (Signed)
 Transition of Care [facility]) - Inpatient Brief Assessment   Patient Details  Name: Leman Martinek MRN: [id number] Date of Birth: October 29, 2000  [facility] CM/SW Contact:    Rosaline JONELLE Joe, RN Phone Number: 02/29/2024, 1:31 PM   Clinical Narrative: Patient admitted from home with adrenal crisis.  Patient is scheduled to discharge home today.  No IP Care management needs prior to discharge to home.  MD Team sent medications through [facility] pharmacy prior to discharge to home.  Patient has established PCP noted.  No other needs at this time.   Transition of Care Asessment: Insurance and Status: (P) Insurance coverage has been reviewed Patient has primary care physician: (P) Yes Home environment has been reviewed: (P) from home Prior level of function:: (P) self Prior/Current Home Services: (P) No current home services Social Drivers of Health Review: (P) SDOH reviewed no interventions necessary Readmission risk has been reviewed: (P) Yes Transition of care needs: (P) no transition of care needs at this time

## 2024-02-29 NOTE — Progress Notes (Signed)
 AVS completed for discharge packet and placed with chart. Primary to do education when guardian arrives. Patient has altered mental status.

## 2024-02-29 NOTE — Discharge Summary (Signed)
 "  Family Medicine Teaching [facility] Discharge Summary  Patient name: Matin Regal Medical record number: [id number] Date of birth: 2001-02-02 Age: 23 y.o. Gender: male Date of Admission: 02/28/2024  Date of Discharge: 02/29/2024 Admitting Physician: Kathrine Melena, DO  Primary Care Provider: Alba Sharper, MD Consultants: none  Indication for Hospitalization: adrenal crisis  Brief Hospital Course:  Chasin Buehl is a 23 y.o.male with a history of developmental delay, adrenal insufficiency, hypothyroidism who was admitted to the [facility] Medicine Teaching Service at [facility] for possible adrenal crisis. His hospital course is detailed below:  Adrenal Crisis Patient presents with N/V/D/abdominal pain, dehydration, hypotension, and febrile likely 2/2 to Influenza A.  Missed Cortef  doses for the past week. Received IV Decadron  4 mg, IV Solu-Cortef  100 mg, and 2.5L LR with persistent hypotension in the ED.  Continued on IV Solu-Cortef  for about 24hrs. He remained stable on this. He was discharged on 50mg  PO hydrocortisone  daily x 4 days then instructed to go back to his regular home dose afterwards.  Influenza A Flu symptoms since 12/24, febrile and tachycardic.  Respiratory panel positive for Influenza A.  Treated with Tamiflu  75 mg x 5 days (12/25 - 12/29) and supportive care.  Other chronic conditions were medically managed with home medications and formulary alternatives as necessary (Central hypothyroidism)  PCP Follow-up Recommendations: Ensure adherence to Cortef  regimen - should switch back to home regimen (15mg  qAM and 5mg  qPM) on 12/31 after completing 4 day course of 50mg  daily   Discharge Diagnoses/Problem List:  [facility]     * (Principal) Adrenal crisis     Chronic health problem     Influenza A     Adrenal insufficiency (Addison's disease) (HCC)     Disposition: home  Discharge Condition: stable  Discharge Exam: per Dr. Elicia on the day  of discharge: General: Alert, pleasant well appearing man laying in bed. NAD.  Cardiovascular: RRR Respiratory: CTAB. Normal WOB on RA. Abdomen: Soft, nontender, nondistended     Significant Procedures: n/a  Significant Labs and Imaging:  Recent Labs  Lab 02/28/24 1122 02/29/24 1123  WBC 8.1 10.9*  HGB 12.1* 10.1*  HCT 36.3* 30.2*  PLT 232 214   Recent Labs  Lab 02/28/24 1122 02/29/24 1123  NA 137 138  K 3.4* 3.8  CL 102 104  CO2 21* 23  GLUCOSE 83 138*  BUN 13 13  CREATININE 1.34* 0.92  CALCIUM 9.0 9.1  ALKPHOS 88 68  AST 91* 74*  ALT 24 23  ALBUMIN 4.6 4.3     Results/Tests Pending at Time of Discharge: n/a  Discharge Medications:  Allergies as of 02/29/2024   No Known Allergies      Medication List     TAKE these medications    acetaminophen  325 MG tablet Commonly known as: Tylenol  Take 2 tablets (650 mg total) by mouth every 6 (six) hours as needed for mild pain (pain score 1-3) or moderate pain (pain score 4-6).   ARIPiprazole 10 MG tablet Commonly known as: ABILIFY Take 10 mg by mouth at bedtime.   BACK & BODY EXTRA STRENGTH PO Take 1 tablet by mouth daily as needed (pain).   FLUoxetine 10 MG capsule Commonly known as: PROZAC Take 10 mg by mouth every morning.   GLUCAGON  EMERGENCY IJ Inject 1 mg as directed as needed (blood sugar).   hydrocortisone  10 MG tablet Commonly known as: CORTEF  Take 5 tablets (50 mg total) by mouth daily for 4  days. Start taking on: March 01, 2024 What changed: You were already taking a medication with the same name, and this prescription was added. Make sure you understand how and when to take each.   hydrocortisone  5 MG tablet Commonly known as: CORTEF  Take 15mg  (3 tablets) in the morning and 5mg  (1 tablet in the afternoon). Take double doses on sick days. Start taking on: March 05, 2024 What changed:  medication strength how much to take how to take this when to take this These instructions  start on March 05, 2024. If you are unsure what to do until then, ask your doctor or other care provider.   hydrocortisone  5 MG tablet Commonly known as: CORTEF  15 mg in the morning and 5 mg in the afternoon Start taking on: March 05, 2024 What changed:  medication strength how much to take how to take this when to take this These instructions start on March 05, 2024. If you are unsure what to do until then, ask your doctor or other care provider.   ibuprofen  200 MG tablet Commonly known as: ADVIL  Take 400 mg by mouth every 6 (six) hours as needed for moderate pain (pain score 4-6).   levothyroxine  125 MCG tablet Commonly known as: SYNTHROID  Take 125 mcg by mouth every morning.   ondansetron  4 MG disintegrating tablet Commonly known as: ZOFRAN -ODT Take 1 tablet (4 mg total) by mouth every 8 (eight) hours as needed for nausea or vomiting.   oseltamivir  75 MG capsule Commonly known as: TAMIFLU  Take 1 capsule (75 mg total) by mouth 2 (two) times daily for 8 doses.   polyethylene glycol powder 17 GM/SCOOP powder Commonly known as: GLYCOLAX /MIRALAX  Take 1 capful (17 g) by mouth daily.        Discharge Instructions: Please refer to Patient Instructions section of EMR for full details.  Patient was counseled important signs and symptoms that should prompt return to medical care, changes in medications, dietary instructions, activity restrictions, and follow up appointments.   Follow-Up Appointments:   Romelle Booty, MD 02/29/2024, 1:19 PM PGY-3, [facility] Family Medicine "

## 2024-02-29 NOTE — Progress Notes (Addendum)
" °   02/29/24 1225  Spiritual Encounters  Type of Visit Initial  Care provided to: Patient  Conversation partners present during encounter Nurse  Referral source Nurse (RN/NT/LPN)  Reason for visit Advance directives  OnCall Visit No  Spiritual Framework  Presenting Themes Impactful experiences and emotions  Patient Stress Factors Health changes  Family Stress Factors None identified  Interventions  Spiritual Care Interventions Made Compassionate presence;Established relationship of care and support  Intervention Outcomes  Outcomes Awareness of support   Chaplain assessed Pt's capacity and determined that Advance Directive education was not appropriate at this time due to developmental delay. Chaplain remains reachable should circumstances change or family request support. "

## 2024-02-29 NOTE — Discharge Instructions (Addendum)
 Dear Sean Kim,  Thank you for letting us  participate in your care. You were hospitalized for Adrenal crisis due to influenza. You were treated with Tamiflu  and steroids.   POST-HOSPITAL & CARE INSTRUCTIONS Please review your updated medication list Regarding your Cortef  (hydrocortisone ) -- for the next 4 days (until 12/30), take 50mg  of hydrocortisone  ONCE DAILY. This is intended to last you through your acute illness period. After completing this 4-day course, return back to your home dosing of 15mg  in the morning and 5mg  in the afternoon. It is important that you take this. Please return to the hospital if you experience worsening symptoms including fatigue, confusion, weakness, abdominal pain, or unable to eat/drink normally. Especially if you are unable to take your steroids. Go to your follow up appointments (listed below)   DOCTOR'S APPOINTMENT   Future Appointments  Date Time Provider Department Center  03/05/2024  9:30 AM Lorrane Pac, MD [facility]     Take care and be well!  Family Medicine Teaching Service Inpatient Team [place]  [facility]  [name] [place], [place] [address] [phone]

## 2024-03-01 LAB — ACTH
C206 ACTH: <1.5 pg/mL — ABNORMAL LOW (ref 7.2–63.3)
C206 ACTH: <1.5 pg/mL — ABNORMAL LOW (ref 7.2–63.3)

## 2024-03-03 ENCOUNTER — Telehealth: Payer: Self-pay

## 2024-03-03 NOTE — Transitions of Care (Post Inpatient/ED Visit) (Signed)
 "  03/03/2024  Name: Sean Kim MRN: [id number] DOB: November 18, 2000  Today's TOC FU Call Status: Today's TOC FU Call Status:: Successful TOC FU Call Completed TOC FU Call Complete Date: 03/03/24  Patient's Name and Date of Birth confirmed. Name, DOB  Transition Care Management Follow-up Telephone Call Date of Discharge: 02/29/24 Discharge Facility: [facility]) Type of Discharge: Inpatient Admission Primary Inpatient Discharge Diagnosis:: flu, adrenal crisis How have you been since you were released from the hospital?: Better Any questions or concerns?: No  Items Reviewed: Did you receive and understand the discharge instructions provided?: Yes Medications obtained,verified, and reconciled?: Yes (Medications Reviewed) Any new allergies since your discharge?: No Dietary orders reviewed?: Yes  Medications Reviewed Today: Medications Reviewed Today     Reviewed by Emmitt Pan, LPN (Licensed Practical Nurse) on 03/03/24 at 1526  Med List Status: <None>   Medication Order Taking? Sig Documenting Provider Last Dose Status Informant  acetaminophen  (TYLENOL ) 325 MG tablet [id number] Yes Take 2 tablets (650 mg total) by mouth every 6 (six) hours as needed for mild pain (pain score 1-3) or moderate pain (pain score 4-6). Johnie Rumaldo LABOR, NP  Active Mother, Pharmacy Records, Self  ARIPiprazole (ABILIFY) 10 MG tablet [id number] Yes Take 10 mg by mouth at bedtime. [provider]  Active Mother, Pharmacy Records, Self  Aspirin-Caffeine (BACK & BODY EXTRA STRENGTH PO) [id number] Yes Take 1 tablet by mouth daily as needed (pain). [provider]  Active Mother, Pharmacy Records, Self  FLUoxetine (PROZAC) 10 MG capsule [id number] Yes Take 10 mg by mouth every morning. [provider]  Active Mother, Pharmacy Records, Self  Glucagon , rDNA, (GLUCAGON  EMERGENCY IJ) [id number] Yes Inject 1 mg as directed as needed (blood sugar).  [provider]  Active  Pharmacy Records, Mother, Self  hydrocortisone  (CORTEF ) 10 MG tablet [id number] Yes Take 5 tablets (50 mg total) by mouth daily for 4 days. Romelle Booty, MD  Active   hydrocortisone  (CORTEF ) 5 MG tablet [id number]  Take 15mg  (3 tablets) in the morning and 5mg  (1 tablet in the afternoon). Take double doses on sick days.  Patient not taking: Reported on 03/03/2024   Romelle Booty, MD  Active   ibuprofen  (ADVIL ) 200 MG tablet [id number] Yes Take 400 mg by mouth every 6 (six) hours as needed for moderate pain (pain score 4-6). [provider]  Active Mother, Pharmacy Records, Self  levothyroxine  (SYNTHROID ) 125 MCG tablet [id number] Yes Take 125 mcg by mouth every morning. [provider]  Active Mother, Pharmacy Records, Self  ondansetron  (ZOFRAN -ODT) 4 MG disintegrating tablet [id number] Yes Take 1 tablet (4 mg total) by mouth every 8 (eight) hours as needed for nausea or vomiting. Willo Dunnings, MD  Active Pharmacy Records, Mother, Self           Med Note SANDIE, MARYLAND J   Fri Feb 29, 2024 11:54 AM) Need refills  oseltamivir  (TAMIFLU ) 75 MG capsule [id number] Yes Take 1 capsule (75 mg total) by mouth 2 (two) times daily for 8 doses. Romelle Booty, MD  Active   polyethylene glycol powder (GLYCOLAX /MIRALAX ) 17 GM/SCOOP powder [id number] Yes Take 1 capful (17 g) by mouth daily. Howell Lunger, DO  Active Mother, Pharmacy Records, Self            Home Care and Equipment/Supplies: Were Home Health Services Ordered?: NA Any new equipment or medical supplies ordered?: NA  Functional Questionnaire: Do you need assistance with bathing/showering or dressing?: No Do you need assistance with meal preparation?: No  Do you need assistance with eating?: No Do you have difficulty maintaining continence: No Do you need assistance with getting out of bed/getting out of a chair/moving?: No Do you have difficulty managing or taking your medications?: No  Follow up appointments  reviewed: PCP Follow-up appointment confirmed?: Yes Date of PCP follow-up appointment?: 03/05/24 Follow-up Provider: [facility] Follow-up appointment confirmed?: NA Do you need transportation to your follow-up appointment?: No Do you understand care options if your condition(s) worsen?: Yes-patient verbalized understanding    SIGNATURE Julian Lemmings, LPN [facility] Nurse Health Advisor Direct Dial [phone]  "

## 2024-03-04 LAB — CULTURE, BLOOD (ROUTINE X 2)
Culture: NO GROWTH
Culture: NO GROWTH
Special Requests: ADEQUATE

## 2024-03-05 ENCOUNTER — Ambulatory Visit (INDEPENDENT_AMBULATORY_CARE_PROVIDER_SITE_OTHER): Payer: MEDICAID

## 2024-03-05 VITALS — BP 105/60 | HR 60 | Temp 97.6°F | Ht 69.0 in | Wt 225.4 lb

## 2024-03-05 DIAGNOSIS — E271 Primary adrenocortical insufficiency: Secondary | ICD-10-CM

## 2024-03-05 DIAGNOSIS — E038 Other specified hypothyroidism: Secondary | ICD-10-CM

## 2024-03-05 DIAGNOSIS — K5904 Chronic idiopathic constipation: Secondary | ICD-10-CM | POA: Diagnosis not present

## 2024-03-05 DIAGNOSIS — Z1159 Encounter for screening for other viral diseases: Secondary | ICD-10-CM

## 2024-03-05 DIAGNOSIS — Z113 Encounter for screening for infections with a predominantly sexual mode of transmission: Secondary | ICD-10-CM | POA: Diagnosis not present

## 2024-03-05 DIAGNOSIS — Z23 Encounter for immunization: Secondary | ICD-10-CM

## 2024-03-05 DIAGNOSIS — R0981 Nasal congestion: Secondary | ICD-10-CM

## 2024-03-05 MED ORDER — HYDROCORTISONE 10 MG PO TABS: 15.0000 mg | ORAL_TABLET | Freq: Every day | ORAL | 1 refills | Status: DC

## 2024-03-05 MED ORDER — BLOOD GLUCOSE MONITORING SUPPL W/DEVICE KIT: 1.0000 | PACK | Freq: Once | 0 refills | Status: AC | PRN

## 2024-03-05 MED ORDER — GLUCAGON EMERGENCY 1 MG/ML IJ SOLR: 1.0000 mg | INTRAMUSCULAR | 1 refills | Status: DC | PRN

## 2024-03-05 MED ORDER — SENNA 8.6 MG PO TABS: 1.0000 | ORAL_TABLET | Freq: Every day | ORAL | 0 refills | Status: AC

## 2024-03-05 MED ORDER — ONDANSETRON 4 MG PO TBDP: 4.0000 mg | ORAL_TABLET | Freq: Three times a day (TID) | ORAL | 0 refills | Status: AC | PRN

## 2024-03-05 MED ORDER — HYDROCORTISONE 5 MG PO TABS: 5.0000 mg | ORAL_TABLET | Freq: Every evening | ORAL | 0 refills | Status: DC

## 2024-03-05 MED ORDER — FLUTICASONE PROPIONATE 50 MCG/ACT NA SUSP: 2.0000 | Freq: Every day | NASAL | 6 refills | Status: AC

## 2024-03-05 NOTE — Patient Instructions (Addendum)
 Thank you for visiting the clinic today, it was good to see you!  Please always bring your medication bottles  In today's visit we discussed:  Addison's disease: Continue taking the 15 mg hydrocortisone  in the morning and 5 mg at night, double the dose if patient is feeling feverish, lethargic, or fatigue.  Or if other sick symptoms are progressively worsening such as feeling short of breath.   Continue taking blood pressures at home, if feeling lightheaded or dizzy, or having tunnel vision when standing these are concerns that his blood pressures too low, or if they are persistently <90/60.  Always repeat an abnormal blood pressure to ensure this correct.  If any of these things are occurring, would recommend either going to the emergency department or scheduling a same-day visit.  Nasal congestion: It is normal to have nasal congestion following influenza infection.  We would be concerned that a bacterial infection is taking place if patient begins to get better and then starts feeling much worse afterwards, feeling feverish or lethargic, or if headaches are worsening with congestion for longer than 10 days.  I am sending a prescription for Flonase  use this is a 2 sprays each nostril once a day, you can also use nasal neti pot to rinse out the sinuses.  Constipation: Continue taking the MiraLAX  once a day, have also sent a prescription for senna (bowel stimulant) to also be taken once a day.  Please follow-up in 3 months  For any questions, please call the office at [phone] or send me a message in MyChart. Have a great day!  -Fairy Amy, MD  [facility] Family Medicine Resident, PGY-1

## 2024-03-05 NOTE — Assessment & Plan Note (Addendum)
 Much better controlled now, recent lab work found ACTH  <1.5 and cortisol 98.3, the patient was receiving IV steroids at that time, lab was also at 11 AM.  Intermittent low blood pressures to mid 80s/70s, nonsustained, asymptomatic.   Resume home dosing of Hydrocortisone  15 mg every morning and 5 mg every evening, double dose when having sick symptoms Continue home blood pressure monitoring

## 2024-03-05 NOTE — Assessment & Plan Note (Addendum)
 TSH done on admission 1.910 much improved from 9 m/o reading 6.186.  Was receiving 137 mcg during hospital admission and reportedly for the last year or so.  Will consider switching back to higher dose with follow-up TSH in 3 months  continue Levothyroxine  125 mcg daily Repeat TSH at 80-month follow-up

## 2024-03-05 NOTE — Progress Notes (Signed)
 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  Since discharge, patient reports Nasal congestion with thick and yellow mucus and headaches with cough.  Nausea has resolved though had an episode yesterday at [employer]. Reports BM every 3-4 days, still straining.  Hypotension: Home BP's have been in 86/63. Endorses some mild dizziness at rest that worsens when he stands. Not light headed. No LOC, changes in vision or feeling light headed when standing or ambulating.  Nasal congestion: sinus congestion started 12/26 and reports nasal pressure. Has had recurrent headaches like his baseline. Lasting about 10 minutes, off an on spontaneously. Has not taken anything to help.  Cough: producing yellow and green mucus, no SOB, or dyspnea.  PERTINENT  PMH / PSH: Central hypothyroidism, pituitary hypoplasia  OBJECTIVE:   BP 105/60   Pulse 60   Temp 97.6 F (36.4 C) (Oral)   Ht 5' 9 (1.753 m)   Wt 225 lb 6 oz (102.2 kg)   SpO2 100%   BMI 33.28 kg/m    General: Resting comfortably in no acute distress  Nose: Bilateral nasal congestion.  Face nontender to palpation/percussion Cardiovascular: Regular rate rhythm, normal S1 and S2, no murmurs rubs or gallops Respiratory: Clear to auscultation bilaterally, no wheezes or crackles Abdomen: Active bowel sounds, nondistended, nontender Extremities: Nonedematous, nontender bilateral lower extremities   ASSESSMENT/PLAN:   Assessment & Plan Adrenal insufficiency (Addison's disease) (HCC) Much better controlled now, recent lab work found ACTH  <1.5 and cortisol 98.3, the patient was receiving IV steroids at that time, lab was also at 11 AM.  Intermittent low blood pressures to mid 80s/70s, nonsustained, asymptomatic.   Resume home dosing of Hydrocortisone  15 mg every morning  and 5 mg every evening, double dose when having sick symptoms Continue home blood pressure monitoring Nasal congestion Lingering after confirmed influenza virus.  Likely postviral, given mild presentation, afebrile, no chills, brief mild headaches. Clinic/ED return precautions given, can consider starting amoxicillin  if worsening symptoms Flonase  2 sprays each nostril daily Regular nasal irrigation with nasal neti pot for symptomatic relief Central hypothyroidism TSH done on admission 1.910 much improved from 9 m/o reading 6.186.  Was receiving 137 mcg during hospital admission and reportedly for the last year or so.  Will consider switching back to higher dose with follow-up TSH in 3 months  continue Levothyroxine  125 mcg daily Repeat TSH at 63-month follow-up Chronic idiopathic constipation Bowel movement about every 3 to 4 days, with straining.  Ongoing issue for a long time likely secondary to adrenal insufficiency.  Discussed importance of hydration and fiber diet.  Does endorse some intermittent nausea with no emesis with abdominal pain could be from nasal congestion versus constipation. Continue daily MiraLAX  Ordered daily senna Refilled Zofran  ODT, instructed to use very sparingly given history of prolonged QTc Need for hepatitis C screening test Hepatitis C ordered Routine screening for STI (sexually transmitted infection) Hep B surface antibody ordered Encounter for immunization HPV, flu, COVID, and men B administered today     Fairy Amy, MD [facility]

## 2024-03-06 ENCOUNTER — Inpatient Hospital Stay (HOSPITAL_COMMUNITY)
Admission: EM | Admit: 2024-03-06 | Discharge: 2024-03-11 | DRG: 644 | Disposition: A | Discharge status: Home / Self Care | Payer: MEDICAID | Attending: Family Medicine | Admitting: Family Medicine

## 2024-03-06 ENCOUNTER — Emergency Department (HOSPITAL_COMMUNITY): Payer: MEDICAID

## 2024-03-06 ENCOUNTER — Other Ambulatory Visit: Payer: Self-pay

## 2024-03-06 ENCOUNTER — Encounter (HOSPITAL_COMMUNITY): Payer: Self-pay | Admitting: *Deleted

## 2024-03-06 DIAGNOSIS — R6521 Severe sepsis with septic shock: Secondary | ICD-10-CM | POA: Diagnosis not present

## 2024-03-06 DIAGNOSIS — E271 Primary adrenocortical insufficiency: Secondary | ICD-10-CM | POA: Diagnosis present

## 2024-03-06 DIAGNOSIS — F79 Unspecified intellectual disabilities: Secondary | ICD-10-CM | POA: Diagnosis present

## 2024-03-06 DIAGNOSIS — F329 Major depressive disorder, single episode, unspecified: Secondary | ICD-10-CM | POA: Diagnosis present

## 2024-03-06 DIAGNOSIS — Z789 Other specified health status: Secondary | ICD-10-CM

## 2024-03-06 DIAGNOSIS — I4581 Long QT syndrome: Secondary | ICD-10-CM | POA: Diagnosis present

## 2024-03-06 DIAGNOSIS — E031 Congenital hypothyroidism without goiter: Secondary | ICD-10-CM | POA: Diagnosis present

## 2024-03-06 DIAGNOSIS — E860 Dehydration: Secondary | ICD-10-CM | POA: Diagnosis present

## 2024-03-06 DIAGNOSIS — R6250 Unspecified lack of expected normal physiological development in childhood: Secondary | ICD-10-CM | POA: Diagnosis present

## 2024-03-06 DIAGNOSIS — N179 Acute kidney failure, unspecified: Secondary | ICD-10-CM | POA: Diagnosis present

## 2024-03-06 DIAGNOSIS — K5900 Constipation, unspecified: Secondary | ICD-10-CM | POA: Diagnosis present

## 2024-03-06 DIAGNOSIS — R9431 Abnormal electrocardiogram [ECG] [EKG]: Secondary | ICD-10-CM | POA: Diagnosis present

## 2024-03-06 DIAGNOSIS — E272 Addisonian crisis: Secondary | ICD-10-CM | POA: Diagnosis present

## 2024-03-06 DIAGNOSIS — I10 Essential (primary) hypertension: Secondary | ICD-10-CM | POA: Diagnosis present

## 2024-03-06 DIAGNOSIS — J111 Influenza due to unidentified influenza virus with other respiratory manifestations: Secondary | ICD-10-CM | POA: Diagnosis not present

## 2024-03-06 DIAGNOSIS — R569 Unspecified convulsions: Secondary | ICD-10-CM | POA: Diagnosis present

## 2024-03-06 DIAGNOSIS — Z79890 Hormone replacement therapy (postmenopausal): Secondary | ICD-10-CM

## 2024-03-06 DIAGNOSIS — Z7982 Long term (current) use of aspirin: Secondary | ICD-10-CM

## 2024-03-06 DIAGNOSIS — R109 Unspecified abdominal pain: Secondary | ICD-10-CM | POA: Insufficient documentation

## 2024-03-06 DIAGNOSIS — A419 Sepsis, unspecified organism: Secondary | ICD-10-CM | POA: Diagnosis not present

## 2024-03-06 DIAGNOSIS — I959 Hypotension, unspecified: Secondary | ICD-10-CM | POA: Diagnosis present

## 2024-03-06 DIAGNOSIS — R001 Bradycardia, unspecified: Secondary | ICD-10-CM | POA: Diagnosis not present

## 2024-03-06 DIAGNOSIS — J101 Influenza due to other identified influenza virus with other respiratory manifestations: Secondary | ICD-10-CM | POA: Diagnosis present

## 2024-03-06 DIAGNOSIS — Z79899 Other long term (current) drug therapy: Secondary | ICD-10-CM | POA: Diagnosis not present

## 2024-03-06 LAB — I-STAT CG4 LACTIC ACID, ED
Lactic Acid, Venous: 2.3 mmol/L (ref 0.5–1.9)
Lactic Acid, Venous: 1.5 mmol/L (ref 0.5–1.9)

## 2024-03-06 LAB — CBC
HCT: 35.9 % — ABNORMAL LOW (ref 39.0–52.0)
Hemoglobin: 11.7 g/dL — ABNORMAL LOW (ref 13.0–17.0)
MCH: 26.9 pg (ref 26.0–34.0)
MCHC: 32.6 g/dL (ref 30.0–36.0)
MCV: 82.5 fL (ref 80.0–100.0)
Platelets: 335 K/uL (ref 150–400)
RBC: 4.35 MIL/uL (ref 4.22–5.81)
RDW: 16.4 % — ABNORMAL HIGH (ref 11.5–15.5)
WBC: 15.1 K/uL — ABNORMAL HIGH (ref 4.0–10.5)
nRBC: 0 % (ref 0.0–0.2)

## 2024-03-06 LAB — COMPREHENSIVE METABOLIC PANEL WITH GFR
ALT: 11 U/L (ref 0–44)
AST: 28 U/L (ref 15–41)
Albumin: 4.2 g/dL (ref 3.5–5.0)
Alkaline Phosphatase: 77 U/L (ref 38–126)
Anion gap: 12 (ref 5–15)
BUN: 16 mg/dL (ref 6–20)
CO2: 28 mmol/L (ref 22–32)
Calcium: 8.7 mg/dL — ABNORMAL LOW (ref 8.9–10.3)
Chloride: 102 mmol/L (ref 98–111)
Creatinine, Ser: 2.67 mg/dL — ABNORMAL HIGH (ref 0.61–1.24)
GFR, Estimated: 33 mL/min — ABNORMAL LOW
Glucose, Bld: 88 mg/dL (ref 70–99)
Potassium: 3.4 mmol/L — ABNORMAL LOW (ref 3.5–5.1)
Sodium: 143 mmol/L (ref 135–145)
Total Bilirubin: 0.9 mg/dL (ref 0.0–1.2)
Total Protein: 7.6 g/dL (ref 6.5–8.1)

## 2024-03-06 LAB — URINALYSIS, ROUTINE W REFLEX MICROSCOPIC
Bilirubin Urine: NEGATIVE
Glucose, UA: NEGATIVE mg/dL
Hgb urine dipstick: NEGATIVE
Ketones, ur: NEGATIVE mg/dL
Leukocytes,Ua: NEGATIVE
Nitrite: NEGATIVE
Protein, ur: NEGATIVE mg/dL
Specific Gravity, Urine: 1.011 (ref 1.005–1.030)
pH: 7 (ref 5.0–8.0)

## 2024-03-06 LAB — LIPASE, BLOOD: Lipase: 16 U/L (ref 11–51)

## 2024-03-06 MED ORDER — METRONIDAZOLE 500 MG/100ML IV SOLN
500.0000 mg | Freq: Once | INTRAVENOUS | Status: AC
  Administered 2024-03-06: 500 mg via INTRAVENOUS
  Filled 2024-03-06: qty 100

## 2024-03-06 MED ORDER — LACTATED RINGERS IV SOLN: INTRAVENOUS | Status: AC

## 2024-03-06 MED ORDER — ACETAMINOPHEN 325 MG PO TABS
650.0000 mg | ORAL_TABLET | Freq: Four times a day (QID) | ORAL | Status: DC | PRN
  Administered 2024-03-07 – 2024-03-09 (×2): 650 mg via ORAL
  Filled 2024-03-06 (×2): qty 2

## 2024-03-06 MED ORDER — ACETAMINOPHEN 325 MG PO TABS
650.0000 mg | ORAL_TABLET | Freq: Once | ORAL | Status: DC
  Filled 2024-03-06: qty 2

## 2024-03-06 MED ORDER — HYDROCORTISONE 5 MG PO TABS
15.0000 mg | ORAL_TABLET | Freq: Every day | ORAL | Status: DC
  Filled 2024-03-06: qty 1

## 2024-03-06 MED ORDER — VANCOMYCIN HCL 2000 MG/400ML IV SOLN
2000.0000 mg | Freq: Once | INTRAVENOUS | Status: AC
  Administered 2024-03-06: 2000 mg via INTRAVENOUS
  Filled 2024-03-06: qty 400

## 2024-03-06 MED ORDER — HYDROCORTISONE 5 MG PO TABS
5.0000 mg | ORAL_TABLET | Freq: Every day | ORAL | Status: DC
  Administered 2024-03-07: 5 mg via ORAL
  Filled 2024-03-06: qty 1

## 2024-03-06 MED ORDER — LACTATED RINGERS IV BOLUS
1000.0000 mL | Freq: Once | INTRAVENOUS | Status: AC
  Administered 2024-03-06: 1000 mL via INTRAVENOUS

## 2024-03-06 MED ORDER — IOHEXOL 350 MG/ML SOLN
65.0000 mL | Freq: Once | INTRAVENOUS | Status: AC | PRN
  Administered 2024-03-06: 65 mL via INTRAVENOUS

## 2024-03-06 MED ORDER — HYDROCORTISONE SOD SUC (PF) 100 MG IJ SOLR
100.0000 mg | Freq: Once | INTRAMUSCULAR | Status: AC
  Administered 2024-03-06: 100 mg via INTRAVENOUS
  Filled 2024-03-06: qty 2

## 2024-03-06 MED ORDER — SODIUM CHLORIDE 0.9 % IV SOLN
2.0000 g | Freq: Once | INTRAVENOUS | Status: AC
  Administered 2024-03-06: 2 g via INTRAVENOUS
  Filled 2024-03-06: qty 12.5

## 2024-03-06 MED ORDER — ENOXAPARIN SODIUM 60 MG/0.6ML IJ SOSY
50.0000 mg | PREFILLED_SYRINGE | INTRAMUSCULAR | Status: DC
  Administered 2024-03-07 – 2024-03-11 (×5): 50 mg via SUBCUTANEOUS
  Filled 2024-03-06 (×5): qty 0.6

## 2024-03-06 MED ORDER — LEVOTHYROXINE SODIUM 25 MCG PO TABS
125.0000 ug | ORAL_TABLET | Freq: Every morning | ORAL | Status: DC
  Administered 2024-03-07 – 2024-03-10 (×4): 125 ug via ORAL
  Filled 2024-03-06 (×4): qty 1

## 2024-03-06 MED ORDER — VANCOMYCIN HCL IN DEXTROSE 1-5 GM/200ML-% IV SOLN
1000.0000 mg | Freq: Once | INTRAVENOUS | Status: DC
  Filled 2024-03-06: qty 200

## 2024-03-06 MED ORDER — ACETAMINOPHEN 650 MG RE SUPP: 650.0000 mg | Freq: Four times a day (QID) | RECTAL | Status: DC | PRN

## 2024-03-06 MED ORDER — IBUPROFEN 400 MG PO TABS
600.0000 mg | ORAL_TABLET | Freq: Once | ORAL | Status: AC
  Administered 2024-03-06: 600 mg via ORAL
  Filled 2024-03-06: qty 1

## 2024-03-06 MED ORDER — LACTATED RINGERS IV BOLUS (SEPSIS)
1000.0000 mL | Freq: Once | INTRAVENOUS | Status: AC
  Administered 2024-03-06: 1000 mL via INTRAVENOUS

## 2024-03-06 MED ORDER — FLUOXETINE HCL 10 MG PO CAPS
10.0000 mg | ORAL_CAPSULE | Freq: Every morning | ORAL | Status: DC
  Administered 2024-03-07 – 2024-03-11 (×5): 10 mg via ORAL
  Filled 2024-03-06 (×5): qty 1

## 2024-03-06 NOTE — Sepsis Progress Note (Signed)
 Elink monitoring for the code sepsis protocol.

## 2024-03-06 NOTE — Assessment & Plan Note (Addendum)
 Central hypothyroidism: Was on Synthroid  137 mcg daily during last admission then d/c w/ Levothyroxine  125 mcg daily  Constipation : Report had diarrhea today - will hold bowel reg for now

## 2024-03-06 NOTE — ED Provider Notes (Signed)
 " [place] EMERGENCY DEPARTMENT AT [facility] Provider Note   CSN: [id number] Arrival date & time: 03/06/24  1744     Patient presents with: Generalized Body Aches   Sean Kim is a 24 y.o. male.   Patient is a 24 year old male with history of adrenal insufficiency and intellectual disability who presents with abdominal pain, body aches and congestion.  He was recently diagnosed with the flu on December 25.  He was admitted overnight.  He had not been taking his Solu-Cortef .  He comes back in today not feeling well.  He complains of cough, congestion and bodyaches.  He is noted to have ongoing fevers up to 102.  He states she has not taken his steroids today or yesterday.  He also complains of abdominal pain.  He does not have any ongoing vomiting or diarrhea.  No urinary symptoms.  His mom is at bedside and says he has been taking his steroids.       Prior to Admission medications  Medication Sig Start Date End Date Taking? Authorizing Provider  acetaminophen  (TYLENOL ) 325 MG tablet Take 2 tablets (650 mg total) by mouth every 6 (six) hours as needed for mild pain (pain score 1-3) or moderate pain (pain score 4-6). 06/25/23   Johnie Flaming A, NP  ARIPiprazole (ABILIFY) 10 MG tablet Take 10 mg by mouth at bedtime. 02/15/24   [provider]  Aspirin-Caffeine (BACK & BODY EXTRA STRENGTH PO) Take 1 tablet by mouth daily as needed (pain).    [provider]  Blood Glucose Monitoring Suppl w/Device KIT 1 each by Does not apply route once as needed for up to 1 dose. 03/05/24   Lorrane Pac, MD  FLUoxetine (PROZAC) 10 MG capsule Take 10 mg by mouth every morning.    [provider]  fluticasone  (FLONASE ) 50 MCG/ACT nasal spray Place 2 sprays into both nostrils daily. 03/05/24   Nygaard, Joseph, MD  Glucagon  HCl (GLUCAGON  EMERGENCY) 1 MG/ML SOLR Inject 1 mg as directed as needed (blood sugar). 03/05/24   Lorrane Pac, MD  hydrocortisone  (CORTEF )  10 MG tablet Take 1.5 tablets (15 mg total) by mouth daily. Take double doses (or 30 mg every morning and 10 mg at night) when feeling sick. 03/05/24 06/03/24  Lorrane Pac, MD  hydrocortisone  (CORTEF ) 5 MG tablet Take 1 tablet (5 mg total) by mouth at bedtime. Take double dose or 10 mg at night if feeling sick. 03/05/24   Lorrane Pac, MD  ibuprofen  (ADVIL ) 200 MG tablet Take 400 mg by mouth every 6 (six) hours as needed for moderate pain (pain score 4-6).    [provider]  levothyroxine  (SYNTHROID ) 125 MCG tablet Take 125 mcg by mouth every morning.    [provider]  ondansetron  (ZOFRAN -ODT) 4 MG disintegrating tablet Take 1 tablet (4 mg total) by mouth every 8 (eight) hours as needed for nausea or vomiting. 03/05/24   Lorrane Pac, MD  polyethylene glycol powder (GLYCOLAX /MIRALAX ) 17 GM/SCOOP powder Take 1 capful (17 g) by mouth daily. 05/25/23   Howell Lunger, DO  senna (SENOKOT) 8.6 MG TABS tablet Take 1 tablet (8.6 mg total) by mouth daily. 03/05/24   Lorrane Pac, MD    Allergies: Patient has no known allergies.    Review of Systems  Constitutional:  Positive for fatigue and fever. Negative for chills and diaphoresis.  HENT:  Positive for congestion and rhinorrhea. Negative for sneezing.   Eyes: Negative.   Respiratory:  Positive for cough. Negative  for chest tightness and shortness of breath.   Cardiovascular:  Negative for chest pain and leg swelling.  Gastrointestinal:  Positive for abdominal pain. Negative for diarrhea, nausea and vomiting.  Genitourinary:  Negative for difficulty urinating, flank pain and frequency.  Musculoskeletal:  Positive for myalgias. Negative for arthralgias.  Skin:  Negative for rash.  Neurological:  Negative for dizziness, speech difficulty, weakness, numbness and headaches.    Updated Vital Signs BP (!) 94/56   Pulse 78   Temp 100.3 F (37.9 C)   Resp 19   Ht 5' 9 (1.753 m)   Wt 102.2 kg   SpO2 98%   BMI 33.28  kg/m   Physical Exam Constitutional:      Appearance: He is well-developed.     Comments: Hypotension  HENT:     Head: Normocephalic and atraumatic.  Eyes:     Pupils: Pupils are equal, round, and reactive to light.  Cardiovascular:     Rate and Rhythm: Regular rhythm. Tachycardia present.     Heart sounds: Normal heart sounds.  Pulmonary:     Effort: Pulmonary effort is normal. No respiratory distress.     Breath sounds: Normal breath sounds. No wheezing or rales.  Chest:     Chest wall: No tenderness.  Abdominal:     General: Bowel sounds are normal.     Palpations: Abdomen is soft.     Tenderness: There is no abdominal tenderness. There is no guarding or rebound.  Musculoskeletal:        General: Normal range of motion.     Cervical back: Normal range of motion and neck supple.  Lymphadenopathy:     Cervical: No cervical adenopathy.  Skin:    General: Skin is warm and dry.     Findings: No rash.  Neurological:     Mental Status: He is alert and oriented to person, place, and time.     (all labs ordered are listed, but only abnormal results are displayed) Labs Reviewed  COMPREHENSIVE METABOLIC PANEL WITH GFR - Abnormal; Notable for the following components:      Result Value   Potassium 3.4 (*)    Creatinine, Ser 2.67 (*)    Calcium 8.7 (*)    GFR, Estimated 33 (*)    All other components within normal limits  CBC - Abnormal; Notable for the following components:   WBC 15.1 (*)    Hemoglobin 11.7 (*)    HCT 35.9 (*)    RDW 16.4 (*)    All other components within normal limits  I-STAT CG4 LACTIC ACID, ED - Abnormal; Notable for the following components:   Lactic Acid, Venous 2.3 (*)    All other components within normal limits  CULTURE, BLOOD (ROUTINE X 2)  CULTURE, BLOOD (ROUTINE X 2)  LIPASE, BLOOD  URINALYSIS, ROUTINE W REFLEX MICROSCOPIC  I-STAT CG4 LACTIC ACID, ED    EKG: None  Radiology: CT ABDOMEN PELVIS W CONTRAST Result Date:  03/06/2024 CLINICAL DATA:  Right lower quadrant abdominal pain. EXAM: CT ABDOMEN AND PELVIS WITH CONTRAST TECHNIQUE: Multidetector CT imaging of the abdomen and pelvis was performed using the standard protocol following bolus administration of intravenous contrast. RADIATION DOSE REDUCTION: This exam was performed according to the departmental dose-optimization program which includes automated exposure control, adjustment of the mA and/or kV according to patient size and/or use of iterative reconstruction technique. CONTRAST:  65mL OMNIPAQUE  IOHEXOL  350 MG/ML SOLN COMPARISON:  CT abdomen pelvis is 05/22/2023. FINDINGS: Evaluation of this  exam is limited due to respiratory motion. Lower chest: Partially visualized trace bilateral pleural effusions. No intra-abdominal free air or free fluid. Hepatobiliary: Probable fatty liver. No biliary dilatation. The gallbladder is unremarkable. Pancreas: Unremarkable. No pancreatic ductal dilatation or surrounding inflammatory changes. Spleen: Normal in size without focal abnormality. Adrenals/Urinary Tract: The adrenal glands are unremarkable. The kidneys, visualized ureters, and urinary bladder appear unremarkable. Stomach/Bowel: There is no bowel obstruction or active inflammation. The appendix is normal. Vascular/Lymphatic: The abdominal aorta and IVC unremarkable. No portal venous gas. There is no adenopathy. Reproductive: The the prostate gland appears small. Diffuse rim calcification of the testicles bilaterally similar to prior CT. Other: None Musculoskeletal: No acute or significant osseous findings. IMPRESSION: 1. No acute intra-abdominal or pelvic pathology. No bowel obstruction. Normal appendix. 2. Partially visualized trace bilateral pleural effusions. Electronically Signed   By: Vanetta Chou M.D.   On: 03/06/2024 20:38   DG Chest Port 1 View Result Date: 03/06/2024 EXAM: 1 VIEW(S) XRAY OF THE CHEST 03/06/2024 07:30:00 PM COMPARISON: 05/22/2023 CLINICAL HISTORY:  cough FINDINGS: LUNGS AND PLEURA: Low lung volumes. No focal pulmonary opacity. No pleural effusion. No pneumothorax. HEART AND MEDIASTINUM: No acute abnormality of the cardiac and mediastinal silhouettes. BONES AND SOFT TISSUES: No acute osseous abnormality. IMPRESSION: 1. Low lung volumes. 2. No acute process. Electronically signed by: Morgane Naveau MD 03/06/2024 07:47 PM EST RP Workstation: [id number]     Procedures   Medications Ordered in the ED  lactated ringers  infusion (has no administration in time range)  lactated ringers  bolus 1,000 mL (has no administration in time range)  vancomycin (VANCOREADY) IVPB 2000 mg/400 mL (2,000 mg Intravenous New Bag/Given 03/06/24 2003)  hydrocortisone  sodium succinate  (SOLU-CORTEF ) 100 MG injection 100 mg (100 mg Intravenous Given 03/06/24 1916)  lactated ringers  bolus 1,000 mL (1,000 mLs Intravenous New Bag/Given 03/06/24 1915)  ceFEPIme (MAXIPIME) 2 g in sodium chloride  0.9 % 100 mL IVPB (0 g Intravenous Stopped 03/06/24 2033)  metroNIDAZOLE (FLAGYL) IVPB 500 mg (500 mg Intravenous New Bag/Given 03/06/24 2002)  iohexol  (OMNIPAQUE ) 350 MG/ML injection 65 mL (65 mLs Intravenous Contrast Given 03/06/24 2032)  ibuprofen  (ADVIL ) tablet 600 mg (600 mg Oral Given 03/06/24 2047)                                    Medical Decision Making Amount and/or Complexity of Data Reviewed Labs: ordered. Radiology: ordered.  Risk Prescription drug management. Decision regarding hospitalization.   This patient presents to the ED for concern of abdominal pain, weakness, this involves an extensive number of treatment options, and is a complaint that carries with it a high risk of complications and morbidity.  I considered the following differential and admission for this acute, potentially life threatening condition.  The differential diagnosis includes pneumonia, influenza, sepsis, adrenal crisis, gastroenteritis, UTI, abdominal infection such as appendicitis or  cholecystitis  MDM:    Patient is a 24 year old male who presents with fatigue, abdominal pain and congestion.  He is febrile.  His blood pressure is low on arrival with systolic pressures in the 80s.  He did have some abdominal tenderness.  He was started on IV fluids.  He was started on septic protocol with IV fluids and IV antibiotics.  His lactic acid is elevated.  His WBC count is elevated.  He appears to have an AKI with an elevated creatinine but no hyperkalemia.  His source is unclear.  Chest x-ray  does not show any obvious source of pneumonia.  His urine is not concerning for infection.  CT of the abdomen pelvis does not show any acute abnormality.  His blood pressure is improving with IV fluids.  His MAP is now greater than 65.  Will plan admission.  He was also given a dose of hydrocortisone .  Discussed with the family medicine resident.  (Labs, imaging, consults)  Labs: I Ordered, and personally interpreted labs.  The pertinent results include: Elevated WBC count, elevated lactic acid, elevated creatinine  Imaging Studies ordered: I ordered imaging studies including chest x-ray, CT abdomen pelvis I independently visualized and interpreted imaging. I agree with the radiologist interpretation  Additional history obtained from mom.  External records from outside source obtained and reviewed including prior notes  Cardiac Monitoring: The patient was maintained on a cardiac monitor.  If on the cardiac monitor, I personally viewed and interpreted the cardiac monitored which showed an underlying rhythm of: Sinus rhythm  Reevaluation: After the interventions noted above, I reevaluated the patient and found that they have :improved  Social Determinants of Health:    Disposition: Admit to hospital  Co morbidities that complicate the patient evaluation  Past Medical History:  Diagnosis Date   ADHD    Agitation 05/23/2023   AKI (acute kidney injury) 05/22/2023   COVID-19 virus  infection    Hypoglycemia    Intermittent left side weakness    MR (mental retardation)    Other cerebral infarction (HCC)    EVAL by  NEUROLOGY note: Dr. 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  There is no reason to think that this was an ischemic process nor that seizures by itself caused the right brain atrophy.  I do not think that the episodes that have been witnessed represent seizures.  I think they are more likely manifestations of hypoglycemia.  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  Ordered Dose: 1 to 200 mg TDD; convert to: TDD div q12h.  Ordered Dose: 201 to 300 mg TDD; convert to: TDD div q8h.  Ordered Dose: >300 mg TDD; DAW.   lactated ringers  bolus 1,000 mL   lactated ringers  infusion   lactated ringers  bolus 1,000 mL    Reason 30 mL/kg dose is not being ordered:   Non-Septic Shock Clinical Presentation   ceFEPIme (MAXIPIME) 2 g in sodium chloride  0.9 % 100 mL IVPB    Antibiotic Indication::   Other Indication (list below)    Other Indication::   Unknown Source.   metroNIDAZOLE (FLAGYL) IVPB 500 mg    Antibiotic Indication::   Other Indication (list below)    Other Indication::   Unknown Source.   DISCONTD: vancomycin (VANCOCIN) IVPB 1000 mg/200 mL premix    Indication::   Other Indication (list below)    Other Indication::   Unknown Source.  vancomycin (VANCOREADY) IVPB 2000 mg/400  mL    Indication::   Sepsis   iohexol  (OMNIPAQUE ) 350 MG/ML injection 65 mL   DISCONTD: acetaminophen  (TYLENOL ) tablet 650 mg   ibuprofen  (ADVIL ) tablet 600 mg    I have reviewed the patients home medicines and have made adjustments as needed  Problem List / ED Course: Problem List Items Addressed This Visit   None Visit Diagnoses       Sepsis with acute renal failure and septic shock, due to unspecified organism, unspecified acute renal failure type (HCC)    -  Primary   Relevant Medications   ceFEPIme (MAXIPIME) 2 g in sodium chloride  0.9 % 100 mL IVPB (Completed)   metroNIDAZOLE (FLAGYL) IVPB 500 mg (Completed)   vancomycin (VANCOREADY) IVPB 2000 mg/400 mL     AKI (acute kidney injury)                    Final diagnoses:  Sepsis with acute renal failure and septic shock, due to unspecified organism, unspecified acute renal failure type (HCC)  AKI (acute kidney injury)    ED Discharge Orders     None          Lenor Hollering, MD 03/06/24 2251  "

## 2024-03-06 NOTE — H&P (Addendum)
 "    Hospital Admission History and Physical Service Pager: [phone]  Patient name: Sean Kim Medical record number: [id number] Date of Birth: 27-Sep-2000 Age: 24 y.o. Gender: male  Primary Care Provider: Alba Sharper, MD  Consultants: None  Code Status: Full Code which was confirmed with family if patient unable to confirm   Preferred Emergency Contact:   Contact Information     Name Relation Home Work Mobile   Sean Kim Mother [phone]  [phone]   Sean Kim,Sean Kim Father [phone]        Other Contacts   None on File      Chief Complaint: Sepsis   Differential and Medical Decision Making:  Sean Kim is a 24 y.o. male presenting with  Hypotensive, abdominal pain, lethargic w/ low grade fever at home  Differential for this patient's presentation of this includes   Sepsis w/ unknown source: patient meet sepsis criteria with his hypotensive, tachycardia, and elevated lactic acid. Lethargic at home w/ poor PO intake. He is on sepsis protocol w/ continuous IVF, IVABX and pending blood culture  AKI : Lactic Acid of 2.3. sCr 2.67 on admission w/ GFR 33 pt w/ poor PO intake since last admission. Mom states he did not urinate at home. His hypotensive may contributed by his dehydration as well but underline sepsis can't exclusively rule out  Post influenza process : his presentation may cause by the fact that he just had influenza ( respiratory panel positive for influenza A ) during last admission. In immuno competent patient sometime it may take up to 14 days after influenza for patient to recovery and feel improved from flu symptoms w/ his comorbidity this may cause him to experiences fatigue and cause poor PO intake result in AKI w/ hypotensive episode   Assessment & Plan Sepsis [facility]) Patient meet sepsis criteria w/ low BP.  - Admit to FMTS, attending Dr. Madelon  - Progressive, Vital signs per floor - Fluids: continue IVF 150 ml/hr  - Antibiotics:  ceFEPIME , Falgyl, Vanc received in ED - Blood cx pending result  - Pain control: Tylenol  625 mg Q6H PRN - AM Labs: CBC, BMP, Mg - AM EKG AKI (acute kidney injury) sCr 2.67 on admission w/ GFR 33 likely from poor PO intake/ dehydration  - Continue IVF at 150 ml/hr - AM BMP Chronic health problem Central hypothyroidism: Was on Synthroid  137 mcg daily during last admission then d/c w/ Levothyroxine  125 mcg daily  Constipation : Report had diarrhea today - will hold bowel reg for now   FEN/GI: Regular diet VTE Prophylaxis: Lovenox    Disposition: Progressive unit   History of Present Illness:  Sean Kim is a 24 y.o. male presenting with hx of adrenal insufficiency, congential hypothyroidism, developmental delay   who present at the ED with abdominal pain, body aches, and congestion. Per report his fever was 102F. Off note pt also report that he has not been taking steroid in the last 2 days. He is recently admitted for adrenal crisis and was discharged on 02/29/24   Per his mother report he stayed up late last night and slept in today until 3pm before mom became concerned. Checked temp and it kept rising, as high as 102 per mom. At home patient w/ poor PO intake since discharge, c/o stomach pain, and refused his medications today which prompt his mom to call EMS.   Mom says per outpatient visit instructions she restarted his normal medications, not stress dose steroids. No missed doses yesterday per mom until today  which he did not take medications.Pt also w/ diarrhea today and was unable to urinated until arrived to the ED   In the ED, patient was hypotensive sBP w/ Lactic Acid of 2.3. sCr 2.67 on admission w/ GFR 33 Sepsis protocol was  initiate. He received 2L of IVF, started w/ IVF continuous at 150 ml/hr, and started on IV ABX. afebrile (Tmax 100.3), hypotensive (85/58) with MAPs >60, tachycardic in the low 100s.  CXR shows low lung volumes w/ CTAP w/ contrast w/ no significant  finding   Review Of Systems: Per HPI  Pertinent Past Medical History: Adrenal insufficiency Developmental delay Seizures Thyroid  disease Remainder reviewed in history tab.   Pertinent Past Surgical History: Tonsillectomy Eye surgery Remainder reviewed in history tab.   Pertinent Social History: Tobacco use: No Alcohol use: No Other Substance use: No Lives with mom, dad, brother, and sister  Pertinent Family History: None contributing     Important Outpatient Medications: Cortef  15 mg AM, 5 mg PM  Levothyroxine  137 mcg daily  ( last dose 03/05/24 ) ABILIFY  10 mg at bedtime - HOLD d/t prolong QTC 566 on admission  Objective: BP (!) 96/55   Pulse 62   Temp 98.2 F (36.8 C) (Oral)   Resp 19   Ht 5' 9 (1.753 m)   Wt 102.2 kg   SpO2 100%   BMI 33.28 kg/m  Exam: General: Appear sleepy  HEENT: NCAT. Sclera anicteric. No rhinorrhea. Cardiovascular: S1S2  Respiratory: CTAB, diminished at bases  Abdomen: Soft, LLQ tenderness w/palpation  Extremities: No BLE edema, no deformities or significant joint findings. Skin: Warm and dry. Neuro: No focal neurological deficits.    Labs:  CBC BMET  Recent Labs  Lab 03/06/24 1824  WBC 15.1*  HGB 11.7*  HCT 35.9*  PLT 335   Recent Labs  Lab 03/06/24 1824  NA 143  K 3.4*  CL 102  CO2 28  BUN 16  CREATININE 2.67*  GLUCOSE 88  CALCIUM 8.7*     Imaging Studies Performed: EXAM: CT ABDOMEN AND PELVIS WITH CONTRAST  IMPRESSION: 1. No acute intra-abdominal or pelvic pathology. No bowel obstruction. Normal appendix. 2. Partially visualized trace bilateral pleural effusions.  1 VIEW(S) XRAY OF THE CHEST  IMPRESSION: 1. Low lung volumes. 2. No acute process.    Suzen Houston NOVAK, DO 03/06/2024, 10:49 PM PGY-1, [facility]  FPTS Intern pager: [phone], text pages welcome Secure chat group [facility] Teaching Service    FMTS Attending Admission Note: Donald Lai, DO    For questions about this patient, please use [email or web address] to page the family medicine resident on call. Pager number [phone].    I have seen and examined this patient, and reviewed their chart. I have discussed this patient with the resident. I agree with the resident's findings, assessment and care plan.  Additionally, likely continued viral process from recent influenza illness. Will continue on stress dose steroids for adrenal insufficiency not quite meeting criteria for crisis, wean as able.     "

## 2024-03-06 NOTE — ED Notes (Signed)
 MD at bedside and aware of BP 87/51 (62), verbal to notify for map <60.

## 2024-03-06 NOTE — Assessment & Plan Note (Addendum)
 sCr 2.67 on admission w/ GFR 33 likely from poor PO intake/ dehydration  - Continue IVF at 150 ml/hr - AM BMP

## 2024-03-06 NOTE — Assessment & Plan Note (Addendum)
 Patient meet sepsis criteria w/ low BP.  - Admit to FMTS, attending Dr. Madelon  - Progressive, Vital signs per floor - Fluids: continue IVF 150 ml/hr  - Antibiotics: ceFEPIME, Falgyl, Vanc received in ED - Blood cx pending result  - Pain control: Tylenol  625 mg Q6H PRN - AM Labs: CBC, BMP, Mg - AM EKG

## 2024-03-06 NOTE — ED Triage Notes (Addendum)
 Pt here via [facility] from home.  Per mother (pt has intellectual disability), pt was dx with the flu in 12/25 and since then has not been taking daily meds (pt has adrenal insufficiency).  Pt c/o body aches, headache and lower abdominal pain.     Mother states temp was 102.6.  650 Tylenol  given at 1715 by EMS.  Bp 102/63 Hr 100 100% RA Cbg 100 Rr 16

## 2024-03-07 ENCOUNTER — Other Ambulatory Visit: Payer: Self-pay

## 2024-03-07 DIAGNOSIS — A419 Sepsis, unspecified organism: Secondary | ICD-10-CM | POA: Diagnosis not present

## 2024-03-07 DIAGNOSIS — N179 Acute kidney failure, unspecified: Secondary | ICD-10-CM | POA: Diagnosis not present

## 2024-03-07 DIAGNOSIS — J111 Influenza due to unidentified influenza virus with other respiratory manifestations: Secondary | ICD-10-CM

## 2024-03-07 DIAGNOSIS — I4581 Long QT syndrome: Secondary | ICD-10-CM | POA: Diagnosis not present

## 2024-03-07 DIAGNOSIS — E271 Primary adrenocortical insufficiency: Secondary | ICD-10-CM | POA: Diagnosis not present

## 2024-03-07 LAB — CBC
HCT: 30.2 % — ABNORMAL LOW (ref 39.0–52.0)
Hemoglobin: 9.8 g/dL — ABNORMAL LOW (ref 13.0–17.0)
MCH: 27.3 pg (ref 26.0–34.0)
MCHC: 32.5 g/dL (ref 30.0–36.0)
MCV: 84.1 fL (ref 80.0–100.0)
Platelets: 293 K/uL (ref 150–400)
RBC: 3.59 MIL/uL — ABNORMAL LOW (ref 4.22–5.81)
RDW: 16.6 % — ABNORMAL HIGH (ref 11.5–15.5)
WBC: 13.5 K/uL — ABNORMAL HIGH (ref 4.0–10.5)
nRBC: 0 % (ref 0.0–0.2)

## 2024-03-07 LAB — URINE DRUG SCREEN
Amphetamines: NEGATIVE
Barbiturates: NEGATIVE
Benzodiazepines: NEGATIVE
Cocaine: NEGATIVE
Fentanyl: NEGATIVE
Methadone Scn, Ur: NEGATIVE
Opiates: NEGATIVE
Tetrahydrocannabinol: NEGATIVE

## 2024-03-07 LAB — CBG MONITORING, ED
Glucose-Capillary: 113 mg/dL — ABNORMAL HIGH (ref 70–99)
Glucose-Capillary: 106 mg/dL — ABNORMAL HIGH (ref 70–99)
Glucose-Capillary: 112 mg/dL — ABNORMAL HIGH (ref 70–99)
Glucose-Capillary: 147 mg/dL — ABNORMAL HIGH (ref 70–99)

## 2024-03-07 LAB — BASIC METABOLIC PANEL WITH GFR
Anion gap: 9 (ref 5–15)
BUN: 17 mg/dL (ref 6–20)
CO2: 26 mmol/L (ref 22–32)
Calcium: 8 mg/dL — ABNORMAL LOW (ref 8.9–10.3)
Chloride: 106 mmol/L (ref 98–111)
Creatinine, Ser: 1.96 mg/dL — ABNORMAL HIGH (ref 0.61–1.24)
GFR, Estimated: 48 mL/min — ABNORMAL LOW
Glucose, Bld: 140 mg/dL — ABNORMAL HIGH (ref 70–99)
Potassium: 3.6 mmol/L (ref 3.5–5.1)
Sodium: 141 mmol/L (ref 135–145)

## 2024-03-07 LAB — TROPONIN T, HIGH SENSITIVITY: Troponin T High Sensitivity: <15 ng/L (ref 0–19)

## 2024-03-07 MED ORDER — VANCOMYCIN HCL IN DEXTROSE 1-5 GM/200ML-% IV SOLN
1000.0000 mg | Freq: Two times a day (BID) | INTRAVENOUS | Status: DC
  Administered 2024-03-07 – 2024-03-08 (×2): 1000 mg via INTRAVENOUS
  Filled 2024-03-07 (×2): qty 200

## 2024-03-07 MED ORDER — ASPIRIN-CAFFEINE 500-32.5 MG PO TABS: 1.0000 | ORAL_TABLET | Freq: Every day | ORAL | Status: DC | PRN

## 2024-03-07 MED ORDER — METRONIDAZOLE 500 MG/100ML IV SOLN
500.0000 mg | Freq: Two times a day (BID) | INTRAVENOUS | Status: DC
  Administered 2024-03-07 – 2024-03-08 (×3): 500 mg via INTRAVENOUS
  Filled 2024-03-07 (×3): qty 100

## 2024-03-07 MED ORDER — LIDOCAINE 5 % EX PTCH
1.0000 | MEDICATED_PATCH | CUTANEOUS | Status: DC
  Administered 2024-03-07: 1 via TRANSDERMAL
  Filled 2024-03-07: qty 1

## 2024-03-07 MED ORDER — SODIUM CHLORIDE 0.9 % IV SOLN
2.0000 g | Freq: Three times a day (TID) | INTRAVENOUS | Status: DC
  Administered 2024-03-07 – 2024-03-08 (×3): 2 g via INTRAVENOUS
  Filled 2024-03-07 (×4): qty 12.5

## 2024-03-07 MED ORDER — HYDROCORTISONE SOD SUC (PF) 100 MG IJ SOLR
50.0000 mg | Freq: Two times a day (BID) | INTRAMUSCULAR | Status: DC
  Administered 2024-03-07 (×2): 50 mg via INTRAVENOUS
  Filled 2024-03-07 (×2): qty 2

## 2024-03-07 NOTE — Assessment & Plan Note (Addendum)
 Cr downtrending 2.67 -> 1.96, s/p 2L ivf in ED. Likely due to poor PO.  Baseline cr ~0.9, no diagnosis of ckd. -LR at / hr - AM BMP

## 2024-03-07 NOTE — ED Notes (Addendum)
 Pt had incontinent episode large BM, linen changed and pericare preformed.

## 2024-03-07 NOTE — ED Notes (Signed)
 Cardiac monitor alarmed for HR 36, Pt found to be sleeping with HR sustained 45-50. EKG obtained and MD paged and notified over phone call. Pt denies any pain at this time.

## 2024-03-07 NOTE — Assessment & Plan Note (Addendum)
 Patient endorsing some chest wall pain, very likely MSK in nature given recent URI w/ cough.  Repeat AM EKG showing Qtcb 574, continued T wave inversions stable vs prior.  Continue holding Abilify Troponin pending

## 2024-03-07 NOTE — Progress Notes (Signed)
 Pharmacy Antibiotic Note  Sean Kim is a 24 y.o. male admitted on 03/06/2024 with hypotension, weakness in the setting of recent viral illness (flu +).  Pharmacy has been consulted for Vancomycin dosing for r/o sepsis.  Patient did receive initial antibiotic dosing overnight in the ED.  Also with AKI, SCr 2.67 > 1.96, baseline ~ 1.  Vancomycin 2gm IV x 1 on 1/1 at 2000  Plan: Vancomycin 1gm IV q12h - first dose now. Estimated AUC 516 (goal 400-550) using SCr 1.96, Vc 0.72 Continue Cefepime 2gm IV q8h and Flagyl 500mg  IV q12h  Height: 5' 9 (175.3 cm) Weight: 102.2 kg (225 lb 6 oz) IBW/kg (Calculated) : 70.7  Temp (24hrs), Avg:98.4 F (36.9 C), Min:97.7 F (36.5 C), Max:100.3 F (37.9 C)  Recent Labs  Lab 02/29/24 1123 03/06/24 1824 03/06/24 1848 03/06/24 2148 03/07/24 0300  WBC 10.9* 15.1*  --   --  13.5*  CREATININE 0.92 2.67*  --   --  1.96*  LATICACIDVEN  --   --  2.3* 1.5  --     Estimated Creatinine Clearance: 68.5 mL/min (A) (by C-G formula based on SCr of 1.96 mg/dL (H)).    Allergies[1]  Antimicrobials this admission: Cefepime 1/1 >> Flagyl 1/1 >> Vanc 1/1 >>  Dose adjustments this admission:   Microbiology results: 1/1 BCx: ngtd   Thank you for allowing pharmacy to be a part of this patients care.  Oren Suzen Acre 03/07/2024 11:06 AM     [1] No Known Allergies

## 2024-03-07 NOTE — Assessment & Plan Note (Addendum)
 Central hypothyroidism: Was on Synthroid  137 mcg daily during last admission then d/c w/ Levothyroxine  125 mcg daily, continue 125  Constipation : Reported recent diarrhea - will hold bowel reg for now, reassess 1/3

## 2024-03-07 NOTE — Assessment & Plan Note (Addendum)
 Initially admitted w/ concern for sepsis. UA and CXR and CT AP WNL. Afebrile but bradycardic in 50s and hypotensive 90s/50s most of the night.  -Lactic downtrending 2.3 -> 1.5 -Continue cefepime, vanc and flagyl until no growth on bcx @ 24h Bcx ngtd < 12h so far, 1/1 PM drawn - Pain control: Tylenol  625 mg Q6H PRN - AM Labs: CBC, BMP

## 2024-03-07 NOTE — Assessment & Plan Note (Signed)
 Patient endorsing some chest wall pain, very likely MSK in nature given recent URI w/ cough.  Repeat AM EKG showing Qtcb 574, continued T wave inversions stable vs prior.  Continue holding Abilify Troponin pending

## 2024-03-07 NOTE — Assessment & Plan Note (Addendum)
 Hypotensive and bradycardic overnight. Pt asymptomatic and appears at baseline this AM.  ISO recent flu and sepsis workup, continue stress dose steroids S/p 100mg  IV hydrocoristone 1/1pm, 5mg  PO on 1/2am IV hydrocortisone  50mg  q12 starting 1/2

## 2024-03-07 NOTE — Progress Notes (Signed)
 "    Daily Progress Note Intern Pager: [phone]  Patient name: Sean Kim Medical record number: [id number] Date of birth: 18-Feb-2001 Age: 24 y.o. 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 Admitted this time with concern for sepsis, was put on broad spectrum antibiotics and stress dose steroids. Now appears to be improving, AKI resolving. Has T wave inversions on prior EKG but will check a troponin to be certain. Watch blood cultures until this evening and then will consider stopping antibiotics. Continue fluids, monitor PO.   Assessment & Plan Sepsis [facility]) Initially admitted w/ concern for sepsis. UA and CXR and CT AP WNL. Afebrile but bradycardic in 50s and hypotensive 90s/50s most of the night.  -Lactic downtrending 2.3 -> 1.5 -Continue cefepime, vanc and flagyl until no growth on bcx @ 24h Bcx ngtd < 12h so far, 1/1 PM drawn - Pain control: Tylenol  625 mg Q6H PRN - AM Labs: CBC, BMP Adrenal insufficiency (Addison's disease) (HCC) Hypotensive and bradycardic overnight. Pt asymptomatic and appears at baseline this AM.  ISO recent flu and sepsis workup, continue stress dose steroids S/p 100mg  IV hydrocoristone 1/1pm, 5mg  PO on 1/2am IV hydrocortisone  50mg  q12 starting 1/2 AKI (acute kidney injury) Cr downtrending 2.67 -> 1.96, s/p 2L ivf in ED. Likely due to poor PO.  Baseline cr ~0.9, no diagnosis of ckd. 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  Repeat AM EKG showing Qtcb 574, continued T wave inversions stable vs prior.  Continue holding Abilify Troponin pending   FEN/GI: Regular PPx: Lovenox  Dispo:Home pending clinical improvement . Barriers include IV steroids, blood cultures.   Subjective:  NAEON. Pt more awake and attentive to the TV this AM per mom. He reports no acute concerns himself.   Objective: Temp:  [97.8 F (36.6 C)-100.3 F (37.9 C)] 97.8 F (36.6 C) (01/02 0434) Pulse Rate:  [45-105] 54 (01/02 0700) Resp:  [14-25] 19 (01/02 0700) BP: (85-116)/(50-81) 116/81 (01/02 0700) SpO2:  [92 %-100 %] 100 % (01/02 0700) Weight:  [102.2 kg] 102.2 kg (01/01 1751) Physical Exam: General: Awake, alert, NAD. Cardio: RRR. Normal S1, S2. No murmur, rub, gallop. 2+ radial and dorsalis pedis pulses b/l w/ good capillary refill.  Resp: CTA bilaterally. No wheezes, rales, or rhonchi. Normal work of breathing on room air Abdomen: soft, non-tender, non-distended.    Laboratory: Most recent CBC Lab Results  Component Value Date   WBC 13.5 (H) 03/07/2024   HGB 9.8 (L) 03/07/2024   HCT 30.2 (L) 03/07/2024   MCV 84.1 03/07/2024   PLT 293 03/07/2024   Most recent BMP    Latest Ref Rng & Units 03/07/2024    3:00 AM  BMP  Glucose 70 - 99 mg/dL 859   BUN 6 - 20 mg/dL 17   Creatinine 9.38 - 1.24 mg/dL 8.03   Sodium 864 - 854  mmol/L 141   Potassium 3.5 - 5.1 mmol/L 3.6   Chloride 98 - 111 mmol/L 106   CO2 22 - 32 mmol/L 26   Calcium 8.9 - 10.3 mg/dL 8.0    LFTs WNL Lipase WNL UDS negative UA WNL  Imaging/Diagnostic Tests:  CXR: No acute CP process  EKG: Sinus brady, prolonged QT, stable T wave inversions and borderline st depression in V4 vs prior  CT AP  1. No acute intra-abdominal or pelvic pathology. No bowel obstruction. Normal appendix. 2. Partially visualized  trace bilateral pleural effusions.  Manon Jester, DO 03/07/2024, 8:03 AM  PGY-1, [facility] FPTS Intern pager: [phone], text pages welcome Secure chat group [facility] Teaching Service   "

## 2024-03-07 NOTE — Hospital Course (Addendum)
 Sean Kim is a 24 y.o.male with a history of adrenal insufficiency, congential hypothyroidism and influenza infection who was admitted to the [facility] Teaching Service at [facility] w/ concern for sepsis. His hospital course is detailed below:  Sepsis Pt admitted w/ AMS, hypotension, tachycardia, white count, elevated lactic. Bcx drawn, empiric antibiotics started w/ cefepime , vanc and flagyl . UA, CXR and CT AP all non revealing. Dad have recent infection with influenza. Lactic and white count downtrended overnight. Bcx showed no growth at 2 days and antibiotics were discontinued on 03/08/2024, leukocytosis***and patient continued to improve clinically.  Adrenal insufficiency Patient borderline hypotensive the evening of admission. Patient was restarted on stress dose steroids, started on 50 mg BID  but had continued bradycardia and increased to 100 mg BID on 1/3 then transitioned to ***after discussing with outpatient endocrinologist.  AKI  Patient had decrease PO intake prior to admission. No baseline CKD, Cr on admission was 2.67, which downtrended to 1.96 after 2L bolus in the ED and 150ml/hr IVF on day 1. Cr downtrended to normal range remained stable.  Abnormal EKG  bradycardia Patient with persistent QT prolongation, his home abilify  was held throughout admission. He also has chronic T wave inversions on EKG and was endorsing chest wall pain that was likely MSK in nature. EKGs appeared stable and trop x 1 was WNL but persistently new bradycardia, echocardiogram was ordered and found ***.  Other chronic conditions were medically managed with home medications and formulary alternatives as necessary (Central hypothyroidism, MDD, constipation)  PCP Follow-up Recommendations: Reconnect with endocrinology, consider stress dose steroid updated dose?

## 2024-03-08 ENCOUNTER — Other Ambulatory Visit: Payer: Self-pay

## 2024-03-08 ENCOUNTER — Inpatient Hospital Stay (HOSPITAL_COMMUNITY): Payer: MEDICAID

## 2024-03-08 DIAGNOSIS — A419 Sepsis, unspecified organism: Secondary | ICD-10-CM | POA: Diagnosis not present

## 2024-03-08 DIAGNOSIS — N179 Acute kidney failure, unspecified: Secondary | ICD-10-CM | POA: Diagnosis not present

## 2024-03-08 DIAGNOSIS — R6521 Severe sepsis with septic shock: Secondary | ICD-10-CM

## 2024-03-08 DIAGNOSIS — E272 Addisonian crisis: Secondary | ICD-10-CM | POA: Diagnosis not present

## 2024-03-08 DIAGNOSIS — R109 Unspecified abdominal pain: Secondary | ICD-10-CM | POA: Insufficient documentation

## 2024-03-08 LAB — BASIC METABOLIC PANEL WITH GFR
Anion gap: 10 (ref 5–15)
BUN: 14 mg/dL (ref 6–20)
CO2: 25 mmol/L (ref 22–32)
Calcium: 8.4 mg/dL — ABNORMAL LOW (ref 8.9–10.3)
Chloride: 107 mmol/L (ref 98–111)
Creatinine, Ser: 1.17 mg/dL (ref 0.61–1.24)
GFR, Estimated: >60 mL/min
Glucose, Bld: 124 mg/dL — ABNORMAL HIGH (ref 70–99)
Potassium: 3.7 mmol/L (ref 3.5–5.1)
Sodium: 142 mmol/L (ref 135–145)

## 2024-03-08 LAB — MAGNESIUM: Magnesium: 2 mg/dL (ref 1.7–2.4)

## 2024-03-08 LAB — CBC
HCT: 31.2 % — ABNORMAL LOW (ref 39.0–52.0)
Hemoglobin: 10.2 g/dL — ABNORMAL LOW (ref 13.0–17.0)
MCH: 27.5 pg (ref 26.0–34.0)
MCHC: 32.7 g/dL (ref 30.0–36.0)
MCV: 84.1 fL (ref 80.0–100.0)
Platelets: 307 K/uL (ref 150–400)
RBC: 3.71 MIL/uL — ABNORMAL LOW (ref 4.22–5.81)
RDW: 16.7 % — ABNORMAL HIGH (ref 11.5–15.5)
WBC: 11.2 K/uL — ABNORMAL HIGH (ref 4.0–10.5)
nRBC: 0 % (ref 0.0–0.2)

## 2024-03-08 LAB — CBG MONITORING, ED
Glucose-Capillary: 109 mg/dL — ABNORMAL HIGH (ref 70–99)
Glucose-Capillary: 112 mg/dL — ABNORMAL HIGH (ref 70–99)
Glucose-Capillary: 114 mg/dL — ABNORMAL HIGH (ref 70–99)
Glucose-Capillary: 476 mg/dL — ABNORMAL HIGH (ref 70–99)

## 2024-03-08 LAB — GLUCOSE, CAPILLARY
Glucose-Capillary: 132 mg/dL — ABNORMAL HIGH (ref 70–99)
Glucose-Capillary: 121 mg/dL — ABNORMAL HIGH (ref 70–99)
Glucose-Capillary: 143 mg/dL — ABNORMAL HIGH (ref 70–99)

## 2024-03-08 LAB — HEMOGLOBIN A1C
Hgb A1c MFr Bld: 5.9 % — ABNORMAL HIGH (ref 4.8–5.6)
Mean Plasma Glucose: 122.63 mg/dL

## 2024-03-08 MED ORDER — ALUM & MAG HYDROXIDE-SIMETH 200-200-20 MG/5ML PO SUSP
15.0000 mL | Freq: Four times a day (QID) | ORAL | Status: DC | PRN
  Administered 2024-03-08: 15 mL via ORAL
  Filled 2024-03-08: qty 30

## 2024-03-08 MED ORDER — HYDROCORTISONE SOD SUC (PF) 100 MG IJ SOLR
100.0000 mg | Freq: Two times a day (BID) | INTRAMUSCULAR | Status: AC
  Administered 2024-03-08: 100 mg via INTRAVENOUS
  Filled 2024-03-08: qty 2

## 2024-03-08 MED ORDER — CYCLOBENZAPRINE HCL 10 MG PO TABS: 5.0000 mg | ORAL_TABLET | Freq: Three times a day (TID) | ORAL | Status: DC | PRN

## 2024-03-08 MED ORDER — CYCLOBENZAPRINE HCL 5 MG PO TABS
5.0000 mg | ORAL_TABLET | Freq: Every day | ORAL | Status: DC | PRN
  Administered 2024-03-08 – 2024-03-10 (×4): 5 mg via ORAL
  Filled 2024-03-08 (×3): qty 1

## 2024-03-08 MED ORDER — INSULIN ASPART 100 UNIT/ML IJ SOLN
0.0000 [IU] | Freq: Three times a day (TID) | INTRAMUSCULAR | Status: DC
  Administered 2024-03-08: 5 [IU] via SUBCUTANEOUS
  Administered 2024-03-09: 2 [IU] via SUBCUTANEOUS
  Administered 2024-03-09: 5 [IU] via SUBCUTANEOUS
  Administered 2024-03-10: 1 [IU] via SUBCUTANEOUS
  Filled 2024-03-08 (×2): qty 5
  Filled 2024-03-08: qty 2
  Filled 2024-03-08: qty 1

## 2024-03-08 MED ORDER — HYDROCORTISONE SOD SUC (PF) 100 MG IJ SOLR
100.0000 mg | Freq: Two times a day (BID) | INTRAMUSCULAR | Status: DC
  Administered 2024-03-08: 100 mg via INTRAVENOUS
  Filled 2024-03-08: qty 2

## 2024-03-08 NOTE — Assessment & Plan Note (Signed)
 Pain resolved this AM. Repeat EKG. - Continue holding Abilify  - Continue Flexeril  5 mg PRN

## 2024-03-08 NOTE — Progress Notes (Signed)
 "    Daily Progress Note Intern Pager: [phone]  Patient name: Sean Kim Medical record number: [id number] Date of birth: 2000-04-04 Age: 24 y.o. 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 Admitted this time with concern for sepsis, was put on broad spectrum antibiotics and no clear source identified.  Doing much better today, blood pressures have remained normotensive, tolerating p.o., chest pain unchanged.  Assessment & Plan Sepsis [facility]) Initially admitted w/ concern for sepsis.  Negative source workup, blood pressures much improved and remains afebrile, leukocytosis improving. Bcx NGTD and improving, will continue close monitoring for change in VS. -DC cefepime , vanc and flagyl  -Bcx ngtd 2 days -De-escalate care to MedSurg -Pain control: Tylenol  625 mg Q6H PRN -AM Labs: CBC, BMP Adrenal insufficiency (Addison's disease) (HCC) Blood pressures normalized, intermittent diastolic hypertension, remains afebrile.  Per literature review, was not receiving adequate stress dose for hospitalization.  Blood glucose remains at goal, except for 476 this morning -Increased IV hydrocortisone  from 50mg  to 100 mg q12 (started 1/2) -Will consider reaching out to endocrinology given repeat hospitalization -Start very sensitive SSI AKI (acute kidney injury) Cr downtrending creatinine this morning 1.17.  Tolerating better p.o. much better with no nausea. Baseline cr ~0.9, no diagnosis of ckd. - Now off maintenance fluids - AM BMP Long QT syndrome Abnormal EKG Endorsing chest wall pain yesterday, received lidocaine  patch with no real change of symptoms.  AM EKG today showing Qtc 568, T wave inversions  improved with T-wave  depression vs u wave.  - Continue holding Abilify  - Troponin <15 - Magnesium 2.0 - Evening EKG at 1800 - DC lidocaine , start Flexeril  5 mg 3 times daily as needed Influenza Diagnosed with prior hospitalization 12/24, completed 5-day course of Tamiflu . 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 MDD: Fluoxetine  10 mg daily Constipation : Reported recent diarrhea - will hold bowel reg for now, reassess 1/3    FEN/GI: Regular diet PPx: Lovenox  Dispo: Home tomorrow. Barriers include sepsis workup.   Subjective:  Saw patient at bedside, he reports that he is tolerating diet much better, ate all of his breakfast.  Denies any nausea or vomiting, headaches have remained the same with some improvement with Tylenol  which she is not asked for for nearly 24 hours.  Lidocaine  patch did not help her chest pain much, reports she is ambulating around the room without difficulty no lightheadedness or dizziness.  Objective: Temp:  [97.4 F (36.3 C)-98.9 F (37.2 C)] 97.9 F (36.6 C) (01/03 0555) Pulse Rate:  [51-73] 51 (01/03 0400) Resp:  [15-20] 16 (01/03 0400) BP: (99-130)/(68-100) 113/93 (01/03 0400) SpO2:  [96 %-100 %] 96 % (01/03 0400) Physical Exam: General: Resting comfortably in no acute distress  cardiovascular: Regular rate rhythm, normal S1 and S2, no murmurs rubs or gallops Respiratory: Clear to auscultation bilaterally, no wheezes or crackles Abdomen: Active bowel sounds, nondistended, mild tenderness to deep palpation of left lower quadrant, no rebound or guarding Extremities: Nonedematous, nontender  Laboratory: Most recent CBC Lab Results  Component Value Date   WBC 11.2 (H) 03/08/2024   HGB 10.2 (L) 03/08/2024   HCT 31.2 (L) 03/08/2024   MCV 84.1 03/08/2024  PLT 307 03/08/2024   Most recent BMP    Latest Ref Rng & Units 03/08/2024    4:41 AM  BMP   Glucose 70 - 99 mg/dL 875   BUN 6 - 20 mg/dL 14   Creatinine 9.38 - 1.24 mg/dL 8.82   Sodium 864 - 854 mmol/L 142   Potassium 3.5 - 5.1 mmol/L 3.7   Chloride 98 - 111 mmol/L 107   CO2 22 - 32 mmol/L 25   Calcium 8.9 - 10.3 mg/dL 8.4     Other pertinent labs troponin < 15 CBGs 147-109-112 overnight  Imaging/Diagnostic Tests: EKG: Qtc 568, T wave inversions  improved with T-wave depression vs u wave.   Lorrane Pac, MD 03/08/2024, 7:06 AM  PGY-1, [facility] FPTS Intern pager: [phone], text pages welcome Secure chat group [facility] Teaching Service   "

## 2024-03-08 NOTE — Assessment & Plan Note (Signed)
 Initially with concerns for sepsis, all abx now discontinued. Blood cultures NGTDx3 days. BPs have normalized; stress dose steroids increased yesterday to ensure adequate dosing. Blood glucose remains appropriate. Does have some complaint of abdominal pain since yesterday, slightly better this AM; RUQ US  normal. - Pain control: Tylenol  625 mg Q6H PRN - AM CBC, BMP - Continue IV hydrocortisone  100 mg q12 (increased on 1/2) for one more dose, then begin taper with IV hydrocortisone  50 mg q12. - Discuss taper plan with primary Endocrinologist on 1/5 to confirm. - Continue very sensitive SSI

## 2024-03-08 NOTE — Plan of Care (Addendum)
 Met with patient and his mother at the bedside. Explained updates from today and hospital course as of now. Mom is still concerned about patient's abdominal pain. He has had abdominal pain from admission. CT abdomen was unremarkable, lipase, CMP wnl. Hepatitis panel still pending. Fever resolved after admission. NO diarrhea or vomiting.   On exam patient is well appearing, but has localized tenderness in RUQ and epigastric area with RUQ > epigastrium. Mild guarding with palpation. Non distended, soft.   Will order RUQ ultrasound given continued pain and initial fever and presentation.   Areta Saliva, MD  PGY 3 [facility]

## 2024-03-08 NOTE — Assessment & Plan Note (Signed)
 Diagnosed with prior hospitalization 12/24, completed 5-day course of Tamiflu .

## 2024-03-08 NOTE — Assessment & Plan Note (Addendum)
 Blood pressures normalized, intermittent diastolic hypertension, remains afebrile.  Per literature review, was not receiving adequate stress dose for hospitalization.  Blood glucose remains at goal, except for 476 this morning -Increased IV hydrocortisone  from 50mg  to 100 mg q12 (started 1/2) -Will consider reaching out to endocrinology given repeat hospitalization -Start very sensitive SSI

## 2024-03-08 NOTE — Assessment & Plan Note (Addendum)
 Cr downtrending creatinine this morning 1.17.  Tolerating better p.o. much better with no nausea. Baseline cr ~0.9, no diagnosis of ckd. - Now off maintenance fluids - AM BMP

## 2024-03-08 NOTE — Assessment & Plan Note (Addendum)
 Endorsing chest wall pain yesterday, received lidocaine  patch with no real change of symptoms.  AM EKG today showing Qtc 568, T wave inversions  improved with T-wave depression vs u wave.  - Continue holding Abilify  - Troponin <15 - Magnesium 2.0 - Evening EKG at 1800 - DC lidocaine , start Flexeril  5 mg 3 times daily as needed

## 2024-03-08 NOTE — Plan of Care (Signed)

## 2024-03-08 NOTE — Progress Notes (Signed)
" ° °  Brief Progress Note   _____________________________________________________________________________________________________________  Patient Name: Sean Kim Patient DOB: [birth year] Date: @TODAY @      Data: 24 yo male currently awaiting admission to a Progressive bed at [facility].      Action: Reaching out to Dr. Nygaard regarding the potential downgrade of the patient to a telemetry or med-surg bed.    Response:  The provider has updated the patient's level of care to reflect a med-surg status.    _____________________________________________________________________________________________________________  [facility] RN [employer] Alam Guterrez S Moneka Mcquinn Please contact us  directly via secure chat (search for [facility]) or by calling us  at [phone] [facility]).  "

## 2024-03-08 NOTE — Assessment & Plan Note (Signed)
 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 MDD: Fluoxetine  10 mg daily Constipation : Reported recent diarrhea - will hold bowel reg for now, reassess 1/3

## 2024-03-08 NOTE — Assessment & Plan Note (Signed)
 Central hypothyroidism: Continue Synthroid  125 mcg daily. MDD: Fluoxetine  10 mg daily Constipation: Reported recent diarrhea - will hold bowel reg for now, reassess 1/3 ***

## 2024-03-08 NOTE — Assessment & Plan Note (Signed)
 RESOLVED. Creatinine 0.97 this AM. Baseline cr ~0.9, no diagnosis of ckd. - AM BMP

## 2024-03-08 NOTE — Assessment & Plan Note (Addendum)
 Initially admitted w/ concern for sepsis.  Negative source workup, blood pressures much improved and remains afebrile, leukocytosis improving. Bcx NGTD and improving, will continue close monitoring for change in VS. -DC cefepime , vanc and flagyl  -Bcx ngtd 2 days -De-escalate care to MedSurg -Pain control: Tylenol  625 mg Q6H PRN -AM Labs: CBC, BMP

## 2024-03-08 NOTE — Progress Notes (Signed)
" ° ° ° °  Daily Progress Note Intern Pager: [phone]  Patient name: Sean Kim Medical record number: [id number] Date of birth: November 06, 2000 Age: 24 y.o. Gender: male  Primary Care Provider: Alba Sharper, MD Consultants: None Code Status: FULL  Pt Overview and Major Events to Date:  1/1 - admitted to FMTS   Medical Decision Making: Sean Kim is a 24 yo M with PMHx primary adrenal insufficiency and congenital hypothyroidism with recent prior admission for adrenal crisis found to have influenza. Admitted this time with concern for sepsis, was put on broad spectrum antibiotics and no clear source identified. Patient continues to improve, vitals stable. Assessment & Plan Sepsis with acute renal failure and septic shock (HCC) Adrenal insufficiency (Addison's disease) (HCC) Abdominal pain Initially with concerns for sepsis, all abx now discontinued. Blood cultures NGTDx3 days. BPs have normalized; stress dose steroids increased yesterday to ensure adequate dosing. Blood glucose remains appropriate. Does have some complaint of abdominal pain since yesterday, slightly better this AM; RUQ US  normal. - Pain control: Tylenol  625 mg Q6H PRN - AM CBC, BMP - Continue IV hydrocortisone  100 mg q12 (increased on 1/2) for one more dose, then begin taper with IV hydrocortisone  50 mg q12. - Discuss taper plan with primary Endocrinologist on 1/5 to confirm. - Continue very sensitive SSI AKI (acute kidney injury) RESOLVED. Creatinine 0.97 this AM. Baseline cr ~0.9, no diagnosis of ckd. - AM BMP Long QT syndrome Abnormal EKG Pain resolved this AM. Repeat EKG. - Continue holding Abilify  - Continue Flexeril  5 mg PRN Chronic health problem Central hypothyroidism: Continue Synthroid  125 mcg daily. MDD: Fluoxetine  10 mg daily Constipation: Reported recent diarrhea - will hold bowel reg for now  FEN/GI: Regular diet PPx: Lovenox  Dispo: Home tomorrow. Barriers include continued clinical  improvement.  Subjective:  NAEON. Resting comfortably, no concerns.  Objective: Temp:  [98.1 F (36.7 C)-98.3 F (36.8 C)] 98.3 F (36.8 C) (01/04 0431) Pulse Rate:  [43-56] 48 (01/04 0431) Resp:  [15-19] 19 (01/04 0431) BP: (101-146)/(65-98) 115/71 (01/04 0431) SpO2:  [95 %-100 %] 97 % (01/04 0431) Physical Exam: General: NAD Cardiovascular: RRR Respiratory: CTA, no increased WOB Abdomen: +bowel sounds, soft, mildly tender in RUQ. No HSM. Extremities: moves all equally  Laboratory: Most recent CBC Lab Results  Component Value Date   WBC 11.2 (H) 03/09/2024   HGB 10.0 (L) 03/09/2024   HCT 29.4 (L) 03/09/2024   MCV 81.4 03/09/2024   PLT 332 03/09/2024   Most recent BMP    Latest Ref Rng & Units 03/09/2024    5:37 AM  BMP  Glucose 70 - 99 mg/dL 880   BUN 6 - 20 mg/dL 14   Creatinine 9.38 - 1.24 mg/dL 9.02   Sodium 864 - 854 mmol/L 139   Potassium 3.5 - 5.1 mmol/L 3.4   Chloride 98 - 111 mmol/L 104   CO2 22 - 32 mmol/L 25   Calcium 8.9 - 10.3 mg/dL 8.6     Lafe Domino, DO 03/09/2024, 6:54 AM  PGY-2, [facility] FPTS Intern pager: [phone], text pages welcome Secure chat group [facility] Teaching Service   "

## 2024-03-09 DIAGNOSIS — E271 Primary adrenocortical insufficiency: Secondary | ICD-10-CM | POA: Diagnosis not present

## 2024-03-09 LAB — BASIC METABOLIC PANEL WITH GFR
Anion gap: 10 (ref 5–15)
BUN: 14 mg/dL (ref 6–20)
CO2: 25 mmol/L (ref 22–32)
Calcium: 8.6 mg/dL — ABNORMAL LOW (ref 8.9–10.3)
Chloride: 104 mmol/L (ref 98–111)
Creatinine, Ser: 0.97 mg/dL (ref 0.61–1.24)
GFR, Estimated: >60 mL/min
Glucose, Bld: 119 mg/dL — ABNORMAL HIGH (ref 70–99)
Potassium: 3.4 mmol/L — ABNORMAL LOW (ref 3.5–5.1)
Sodium: 139 mmol/L (ref 135–145)

## 2024-03-09 LAB — CBC
HCT: 29.4 % — ABNORMAL LOW (ref 39.0–52.0)
Hemoglobin: 10 g/dL — ABNORMAL LOW (ref 13.0–17.0)
MCH: 27.7 pg (ref 26.0–34.0)
MCHC: 34 g/dL (ref 30.0–36.0)
MCV: 81.4 fL (ref 80.0–100.0)
Platelets: 332 K/uL (ref 150–400)
RBC: 3.61 MIL/uL — ABNORMAL LOW (ref 4.22–5.81)
RDW: 16.6 % — ABNORMAL HIGH (ref 11.5–15.5)
WBC: 11.2 K/uL — ABNORMAL HIGH (ref 4.0–10.5)
nRBC: 0 % (ref 0.0–0.2)

## 2024-03-09 LAB — MAGNESIUM: Magnesium: 2.3 mg/dL (ref 1.7–2.4)

## 2024-03-09 LAB — GLUCOSE, CAPILLARY
Glucose-Capillary: 396 mg/dL — ABNORMAL HIGH (ref 70–99)
Glucose-Capillary: 130 mg/dL — ABNORMAL HIGH (ref 70–99)
Glucose-Capillary: 235 mg/dL — ABNORMAL HIGH (ref 70–99)
Glucose-Capillary: 146 mg/dL — ABNORMAL HIGH (ref 70–99)

## 2024-03-09 MED ORDER — HYDROCORTISONE SOD SUC (PF) 100 MG IJ SOLR
100.0000 mg | Freq: Two times a day (BID) | INTRAMUSCULAR | Status: AC
  Administered 2024-03-09: 100 mg via INTRAVENOUS
  Filled 2024-03-09: qty 2

## 2024-03-09 MED ORDER — HYDROCORTISONE 20 MG PO TABS
30.0000 mg | ORAL_TABLET | ORAL | Status: DC
  Administered 2024-03-10 – 2024-03-11 (×2): 30 mg via ORAL
  Filled 2024-03-09 (×2): qty 1

## 2024-03-09 MED ORDER — HYDROCORTISONE 10 MG PO TABS
10.0000 mg | ORAL_TABLET | Freq: Every day | ORAL | Status: DC
  Administered 2024-03-10 – 2024-03-11 (×2): 10 mg via ORAL
  Filled 2024-03-09 (×2): qty 1

## 2024-03-09 MED ORDER — POTASSIUM CHLORIDE 20 MEQ PO PACK
40.0000 meq | PACK | Freq: Once | ORAL | Status: AC
  Administered 2024-03-09: 40 meq via ORAL
  Filled 2024-03-09: qty 2

## 2024-03-09 MED ORDER — HYDROCORTISONE SOD SUC (PF) 100 MG IJ SOLR
50.0000 mg | Freq: Once | INTRAMUSCULAR | Status: AC
  Administered 2024-03-09: 50 mg via INTRAVENOUS
  Filled 2024-03-09: qty 1

## 2024-03-09 NOTE — Plan of Care (Signed)
  Problem: Education: Goal: Individualized Educational Video(s) Outcome: Progressing   Problem: Coping: Goal: Ability to adjust to condition or change in health will improve Outcome: Progressing   Problem: Fluid Volume: Goal: Ability to maintain a balanced intake and output will improve Outcome: Progressing   Problem: Health Behavior/Discharge Planning: Goal: Ability to identify and utilize available resources and services will improve Outcome: Progressing Goal: Ability to manage health-related needs will improve Outcome: Progressing   Problem: Metabolic: Goal: Ability to maintain appropriate glucose levels will improve Outcome: Progressing   Problem: Nutritional: Goal: Maintenance of adequate nutrition will improve Outcome: Progressing Goal: Progress toward achieving an optimal weight will improve Outcome: Progressing   Problem: Skin Integrity: Goal: Risk for impaired skin integrity will decrease Outcome: Progressing   Problem: Tissue Perfusion: Goal: Adequacy of tissue perfusion will improve Outcome: Progressing   Problem: Health Behavior/Discharge Planning: Goal: Ability to manage health-related needs will improve Outcome: Progressing   Problem: Clinical Measurements: Goal: Ability to maintain clinical measurements within normal limits will improve Outcome: Progressing Goal: Will remain free from infection Outcome: Progressing Goal: Diagnostic test results will improve Outcome: Progressing Goal: Respiratory complications will improve Outcome: Progressing Goal: Cardiovascular complication will be avoided Outcome: Progressing   Problem: Activity: Goal: Risk for activity intolerance will decrease Outcome: Progressing   Problem: Nutrition: Goal: Adequate nutrition will be maintained Outcome: Progressing   Problem: Coping: Goal: Level of anxiety will decrease Outcome: Progressing   Problem: Elimination: Goal: Will not experience complications related to  bowel motility Outcome: Progressing Goal: Will not experience complications related to urinary retention Outcome: Progressing   Problem: Pain Managment: Goal: General experience of comfort will improve and/or be controlled Outcome: Progressing   Problem: Safety: Goal: Ability to remain free from injury will improve Outcome: Progressing   Problem: Skin Integrity: Goal: Risk for impaired skin integrity will decrease Outcome: Progressing

## 2024-03-09 NOTE — Evaluation (Signed)
 Occupational Therapy Evaluation Patient Details Name: Sean Kim MRN: [id number] DOB: 2000/07/07 Today's Date: 03/09/2024   History of Present Illness   24 y.o. male presents 03/06/24 with hypotension, abdominal pain, lethargic, and low grade fever. Admit with concern for sepsis. PMH: adrenal insufficiency, congential hypothyroidism, developmental delay, seizures. Recent admit for adrenal crisis and d/c 02/29/24.     Clinical Impressions PTA Pt was independent with functional mobility and required light assistance from mother for ADL/IADLs. Pt currently requires up to CGA overall for ADL engagement and mobility. Pt requires cues for sequencing tasks and task completion. Pt primarily limited by decreased activity tolerance, generalized weakness, and impaired cognition. Pt will benefit from acute OT services in acute care to facilitate progress towards goals. Recommend HHOT services once medically cleared for d/c to increase safe, independent engagement in ADL tasks and reduce burden of care in the home environment.      If plan is discharge home, recommend the following:   A little help with walking and/or transfers;A little help with bathing/dressing/bathroom;Assistance with cooking/housework;Direct supervision/assist for medications management;Direct supervision/assist for financial management;Assist for transportation;Supervision due to cognitive status     Functional Status Assessment   Patient has had a recent decline in their functional status and demonstrates the ability to make significant improvements in function in a reasonable and predictable amount of time.     Equipment Recommendations   None recommended by OT     Recommendations for Other Services         Precautions/Restrictions   Precautions Precautions: Fall Recall of Precautions/Restrictions: Impaired Restrictions Weight Bearing Restrictions Per Provider Order: No     Mobility Bed Mobility Overal  bed mobility: Modified Independent             General bed mobility comments: No physical assistance required for bed mobility    Transfers Overall transfer level: Needs assistance Equipment used: None Transfers: Sit to/from Stand Sit to Stand: Contact guard assist           General transfer comment: CGA for safety during ambulation from bed to toilet, sink, then back to bed. Slight unsteadiness observed on feet, not formally assessed.      Balance Overall balance assessment: Mild deficits observed, not formally tested                                         ADL either performed or assessed with clinical judgement   ADL Overall ADL's : Needs assistance/impaired Eating/Feeding: Set up   Grooming: Contact guard assist;Cueing for sequencing;Cueing for safety;Wash/dry hands Grooming Details (indicate cue type and reason): wash hands at sink, verbal cues for sequencing task and completing actions Upper Body Bathing: Contact guard assist   Lower Body Bathing: Contact guard assist;Sitting/lateral leans   Upper Body Dressing : Contact guard assist   Lower Body Dressing: Contact guard assist   Toilet Transfer: Contact guard assist;Ambulation Toilet Transfer Details (indicate cue type and reason): Urinated while standing facing toilet with CGA Toileting- Clothing Manipulation and Hygiene: Supervision/safety               Vision Patient Visual Report: No change from baseline Vision Assessment?: No apparent visual deficits     Perception         Praxis         Pertinent Vitals/Pain Pain Assessment Pain Assessment: No/denies pain     Extremity/Trunk Assessment Upper Extremity  Assessment Upper Extremity Assessment: Generalized weakness   Lower Extremity Assessment Lower Extremity Assessment: Overall WFL for tasks assessed   Cervical / Trunk Assessment Cervical / Trunk Assessment: Normal   Communication Communication Communication: No  apparent difficulties   Cognition Arousal: Alert Behavior During Therapy: Flat affect Cognition: History of cognitive impairments             OT - Cognition Comments: developmental delay                 Following commands: Intact       Cueing  General Comments   Cueing Techniques: Verbal cues;Visual cues  Mother reports that Pt is presenting off from baseline ADL abilities. Incentive spirometer provided to Pt. Educated Pt and mother on proper use.   Exercises     Shoulder Instructions      Home Living Family/patient expects to be discharged to:: Private residence Living Arrangements: Parent;Other relatives Available Help at Discharge: Family;Available 24 hours/day Type of Home: House Home Access: Stairs to enter [employer] of Steps: a few Entrance Stairs-Rails: Right;Left Home Layout: Two level;Bed/bath upstairs;1/2 bath on main level Alternate Level Stairs-Number of Steps: 12 Alternate Level Stairs-Rails: Right;Left Bathroom Shower/Tub: Tub/shower unit;Curtain   Bathroom Toilet: Standard Bathroom Accessibility: Yes How Accessible: Accessible via walker Home Equipment: None   Additional Comments: Information from prior admission, Mother endorses that there have been no changes to home set-up      Prior Functioning/Environment Prior Level of Function : Needs assist  Cognitive Assist : ADLs (cognitive)           Mobility Comments: Independent with mobility ADLs Comments: Mother states that will assist with ADLs as needed.    OT Problem List: Decreased strength;Decreased activity tolerance;Impaired balance (sitting and/or standing);Decreased cognition;Decreased safety awareness;Decreased knowledge of use of DME or AE   OT Treatment/Interventions: Self-care/ADL training;Therapeutic exercise;Energy conservation;DME and/or AE instruction;Therapeutic activities;Cognitive remediation/compensation;Patient/family education;Balance training       OT Goals(Current goals can be found in the care plan section)   Acute Rehab OT Goals Patient Stated Goal: to go home OT Goal Formulation: With patient/family Time For Goal Achievement: 03/23/24 Potential to Achieve Goals: Good ADL Goals Pt Will Perform Grooming: with supervision;standing Pt Will Transfer to Toilet: Independently;regular height toilet;ambulating Pt Will Perform Toileting - Clothing Manipulation and hygiene: Independently;sit to/from stand Pt/caregiver will Perform Home Exercise Program: Increased strength;Both right and left upper extremity;With written HEP provided   OT Frequency:  Min 2X/week    Co-evaluation              AM-PAC OT 6 Clicks Daily Activity     Outcome Measure Help from another person eating meals?: A Little Help from another person taking care of personal grooming?: A Little Help from another person toileting, which includes using toliet, bedpan, or urinal?: A Little Help from another person bathing (including washing, rinsing, drying)?: A Little Help from another person to put on and taking off regular upper body clothing?: A Little Help from another person to put on and taking off regular lower body clothing?: A Little 6 Click Score: 18   End of Session Nurse Communication: Mobility status  Activity Tolerance: Patient tolerated treatment well Patient left: in bed;with call bell/phone within reach;with bed alarm set;with nursing/sitter in room  OT Visit Diagnosis: Unsteadiness on feet (R26.81);Muscle weakness (generalized) (M62.81);Other symptoms and signs involving cognitive function                Time: 9195-9179 OT Time Calculation (min):  16 min Charges:  OT General Charges $OT Visit: 1 Visit OT Evaluation $OT Eval Low Complexity: 1 Low  Maurilio CROME, OTR/L.  [facility] Acute Rehabilitation  Office: [phone]   Maurilio PARAS Jarnell Cordaro 03/09/2024, 10:25 AM

## 2024-03-10 ENCOUNTER — Inpatient Hospital Stay (HOSPITAL_COMMUNITY): Payer: MEDICAID

## 2024-03-10 DIAGNOSIS — E271 Primary adrenocortical insufficiency: Secondary | ICD-10-CM | POA: Diagnosis not present

## 2024-03-10 DIAGNOSIS — R9431 Abnormal electrocardiogram [ECG] [EKG]: Secondary | ICD-10-CM

## 2024-03-10 DIAGNOSIS — R001 Bradycardia, unspecified: Secondary | ICD-10-CM | POA: Insufficient documentation

## 2024-03-10 LAB — GLUCOSE, CAPILLARY
Glucose-Capillary: 170 mg/dL — ABNORMAL HIGH (ref 70–99)
Glucose-Capillary: 121 mg/dL — ABNORMAL HIGH (ref 70–99)
Glucose-Capillary: 102 mg/dL — ABNORMAL HIGH (ref 70–99)
Glucose-Capillary: 152 mg/dL — ABNORMAL HIGH (ref 70–99)

## 2024-03-10 LAB — BASIC METABOLIC PANEL WITH GFR
Anion gap: 8 (ref 5–15)
BUN: 17 mg/dL (ref 6–20)
CO2: 27 mmol/L (ref 22–32)
Calcium: 8.6 mg/dL — ABNORMAL LOW (ref 8.9–10.3)
Chloride: 104 mmol/L (ref 98–111)
Creatinine, Ser: 0.98 mg/dL (ref 0.61–1.24)
GFR, Estimated: >60 mL/min
Glucose, Bld: 101 mg/dL — ABNORMAL HIGH (ref 70–99)
Potassium: 3.7 mmol/L (ref 3.5–5.1)
Sodium: 140 mmol/L (ref 135–145)

## 2024-03-10 LAB — ECHOCARDIOGRAM COMPLETE
Area-P 1/2: 3.37 cm2
Height: 69 in
S' Lateral: 3.5 cm
Weight: 3605.98 [oz_av]

## 2024-03-10 LAB — CBC
HCT: 31.4 % — ABNORMAL LOW (ref 39.0–52.0)
Hemoglobin: 10.2 g/dL — ABNORMAL LOW (ref 13.0–17.0)
MCH: 27.1 pg (ref 26.0–34.0)
MCHC: 32.5 g/dL (ref 30.0–36.0)
MCV: 83.3 fL (ref 80.0–100.0)
Platelets: 348 K/uL (ref 150–400)
RBC: 3.77 MIL/uL — ABNORMAL LOW (ref 4.22–5.81)
RDW: 16.4 % — ABNORMAL HIGH (ref 11.5–15.5)
WBC: 12.6 K/uL — ABNORMAL HIGH (ref 4.0–10.5)
nRBC: 0.2 % (ref 0.0–0.2)

## 2024-03-10 LAB — MAGNESIUM: Magnesium: 2.2 mg/dL (ref 1.7–2.4)

## 2024-03-10 LAB — T4, FREE: Free T4: 0.73 ng/dL — ABNORMAL LOW (ref 0.80–2.00)

## 2024-03-10 MED ORDER — LEVOTHYROXINE SODIUM 25 MCG PO TABS
137.0000 ug | ORAL_TABLET | Freq: Every morning | ORAL | Status: DC
  Administered 2024-03-11: 137 ug via ORAL
  Filled 2024-03-10: qty 1

## 2024-03-10 MED ORDER — ARIPIPRAZOLE 5 MG PO TABS
10.0000 mg | ORAL_TABLET | Freq: Every day | ORAL | Status: DC
  Administered 2024-03-10: 10 mg via ORAL
  Filled 2024-03-10: qty 2

## 2024-03-10 MED ORDER — POLYETHYLENE GLYCOL 3350 17 G PO PACK
17.0000 g | PACK | Freq: Every day | ORAL | Status: DC
  Administered 2024-03-10 – 2024-03-11 (×2): 17 g via ORAL
  Filled 2024-03-10 (×2): qty 1

## 2024-03-10 MED ORDER — SENNOSIDES-DOCUSATE SODIUM 8.6-50 MG PO TABS
1.0000 | ORAL_TABLET | Freq: Two times a day (BID) | ORAL | Status: DC
  Administered 2024-03-10 – 2024-03-11 (×3): 1 via ORAL
  Filled 2024-03-10 (×3): qty 1

## 2024-03-10 MED ORDER — POTASSIUM CHLORIDE 20 MEQ PO PACK
40.0000 meq | PACK | Freq: Once | ORAL | Status: AC
  Administered 2024-03-10: 40 meq via ORAL
  Filled 2024-03-10: qty 2

## 2024-03-10 NOTE — Assessment & Plan Note (Addendum)
 Denies chest pain, shortness of breath.  Persistent bradycardia between 40 and 50 bpm, EKG with U wave and T4 of 0.73, concerning for hypokalemia versus sequelae of central hypothyroidism.  Asymptomatic.  - Continue Flexeril  5 mg PRN - Awaiting endocrinology recommendations as above if Synthroid  needs to be increased - Awaiting echocardiogram results - Consider K threshold of >4

## 2024-03-10 NOTE — Evaluation (Signed)
 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  There is no reason to think that this was an ischemic process nor that seizures by itself caused the right brain atrophy.  I do not think that the episodes that have been witnessed represent seizures.  I think they are more likely manifestations of hypoglycemia.  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 male presents 03/06/24 with hypotension, abdominal pain, lethargic, and low grade fever. Admit with concern for sepsis. PMH: adrenal insufficiency, congential hypothyroidism, developmental delay, seizures. Recent admit for adrenal crisis and d/c 02/29/24.   Assessment / Plan / Recommendation Clinical Impression  Pt reports that he is at his cognitive/communicative baseline, and per other descriptions found in his chart, as well as no acute neurological diagnosis, suspect that he may be at or at least near his baseline. He receives support from his parents at baseline with ADLs/iADLs and should be appropriate to return home with assist. If any ongoing acute changes are noted by family, would consider [facility] SLP follow up as any underlying infection clears.   Pt does present with cognitive and communicative difficulties, but believes that they are at his baseline for him. From a communication standpoint, there are more expressive difficulties noted compared to receptive, although he does tend to benefit from extra processing time. Semantic paraphasias are noted, as is word-finding difficulty, but he communicates pretty functionally to convey wants/needs. He says that uses his phone sometimes to set alarms, and demonstrated his ability to do so with Mod I. He also found his calendar with Mod I, and was locked from adding items, but upon further questioning, it sounds like his mother adds appointments and he uses the calendar mostly for pulling up the information.      SLP Assessment  SLP Recommendation/Assessment: All further Speech Language Pathology needs can be addressed in the next venue of care SLP Visit Diagnosis: Cognitive communication deficit (R41.841)     Assistance Recommended at Discharge  Frequent or constant Supervision/Assistance  Functional Status Assessment Patient has had a recent decline in their functional status and demonstrates the ability to make  significant improvements in function in a reasonable and predictable amount of time.  Frequency and Duration           SLP Evaluation Cognition  Overall Cognitive Status: No family/caregiver present to determine baseline cognitive functioning Orientation Level: Oriented X4 Attention: Sustained Sustained Attention: Appears intact Problem Solving: Impaired Problem Solving Impairment: Functional complex       Comprehension  Auditory Comprehension Overall Auditory Comprehension: Appears within functional limits for tasks assessed    Expression Expression Primary Mode of Expression: Verbal Verbal Expression Overall Verbal Expression: Impaired at baseline (per pt report)   Oral / Motor  Motor Speech Overall Motor Speech: Appears within functional limits for tasks assessed (at baseline per pt)            Leita SAILOR., M.A. CCC-SLP Acute Rehabilitation Services Office: [phone]  Secure chat preferred  03/10/2024, 1:00 PM

## 2024-03-10 NOTE — Assessment & Plan Note (Deleted)
 RESOLVED. Creatinine 0.97 this AM. Baseline cr ~0.9, no diagnosis of ckd. - AM BMP

## 2024-03-10 NOTE — Assessment & Plan Note (Addendum)
 Abdominal pain most likely secondary to constipation as patient does not report a bowel movement since admission.  Per chart review, patient does have an at home bowel regimen that we had been holding as he came in with loose stools. - MiraLAX  daily - Senna daily

## 2024-03-10 NOTE — Plan of Care (Signed)
 Spoke to patient endocrinologist Dr. Faythe.  He recommends increasing levothyroxine  to 137 mcg and checking a free T4 in office in about 4 to 6 weeks (aim should be 0.8) as EKG changes and bradycardia are most likely related to his hypothyroidism.  He also recommends continuing the stress double dose of home hydrocortisone  that he is on inpatient (30 mg a.m. and 10 mg p.m.) on discharge and following up in 1 week in his office to discuss.  Appreciate consult and recommendations.

## 2024-03-10 NOTE — Assessment & Plan Note (Addendum)
 Significantly improved, doing well.  - Pain control: Tylenol  625 mg Q6H PRN Back - Endocrinology Dr. Faythe consulted, awaiting recommendations for stress dose taper  - Continue hydrocortisone  - Continue very sensitive SSI

## 2024-03-10 NOTE — Assessment & Plan Note (Addendum)
"  Central hypothyroidism: Continue Synthroid  125 mcg daily. MDD: Fluoxetine  10 mg daily, restarted Abilify  1/5 "

## 2024-03-10 NOTE — Progress Notes (Signed)
 2D Echocardiogram completed

## 2024-03-10 NOTE — Plan of Care (Signed)
" °  Problem: Health Behavior/Discharge Planning: Goal: Ability to identify and utilize available resources and services will improve Outcome: Progressing Goal: Ability to manage health-related needs will improve Outcome: Progressing   Problem: Metabolic: Goal: Ability to maintain appropriate glucose levels will improve Outcome: Progressing   Problem: Nutritional: Goal: Maintenance of adequate nutrition will improve Outcome: Progressing Goal: Progress toward achieving an optimal weight will improve Outcome: Progressing   Problem: Skin Integrity: Goal: Risk for impaired skin integrity will decrease Outcome: Progressing   Problem: Tissue Perfusion: Goal: Adequacy of tissue perfusion will improve Outcome: Progressing   Problem: Health Behavior/Discharge Planning: Goal: Ability to manage health-related needs will improve Outcome: Progressing   Problem: Clinical Measurements: Goal: Ability to maintain clinical measurements within normal limits will improve Outcome: Progressing Goal: Will remain free from infection Outcome: Progressing Goal: Diagnostic test results will improve Outcome: Progressing Goal: Respiratory complications will improve Outcome: Progressing Goal: Cardiovascular complication will be avoided Outcome: Progressing   Problem: Activity: Goal: Risk for activity intolerance will decrease Outcome: Progressing   Problem: Nutrition: Goal: Adequate nutrition will be maintained Outcome: Progressing   Problem: Coping: Goal: Level of anxiety will decrease Outcome: Progressing   Problem: Elimination: Goal: Will not experience complications related to bowel motility Outcome: Progressing Goal: Will not experience complications related to urinary retention Outcome: Progressing   Problem: Pain Managment: Goal: General experience of comfort will improve and/or be controlled Outcome: Progressing   Problem: Safety: Goal: Ability to remain free from injury will  improve Outcome: Progressing   Problem: Skin Integrity: Goal: Risk for impaired skin integrity will decrease Outcome: Progressing   "

## 2024-03-10 NOTE — Progress Notes (Addendum)
 "    Daily Progress Note Intern Pager: [phone]  Patient name: Sean Kim Medical record number: [id number] Date of birth: 08/09/2000 Age: 24 y.o. Gender: male  Primary Care Provider: Alba Sharper, MD Consultants: Endocrinology Code Status: Full  Pt Overview and Major Events to Date:  01/01: Admitted for sepsis in the setting of influenza and adrenal insufficiency  Assessment and Plan:  Sean Kim is a 24 yo M with PMHx primary adrenal insufficiency and congenital hypothyroidism with recent admission for adrenal crisis found to have influenza. Admitted this time with concern for sepsis, was put on broad spectrum antibiotics and no clear source identified.   Doing well today, no complaints.  Tells me he has not had a bowel movement since admission.  Will reach out to endocrinology for steroid dose recommendations today.  Pending discharge tomorrow or the day after. Assessment & Plan Sepsis with acute renal failure and septic shock (HCC) Adrenal insufficiency (Addison's disease) (HCC) Significantly improved, doing well.  - Pain control: Tylenol  625 mg Q6H PRN Back - Endocrinology Dr. Faythe consulted, awaiting recommendations for stress dose taper  - Continue hydrocortisone  - Continue very sensitive SSI Long QT syndrome Abnormal EKG Bradycardia Denies chest pain, shortness of breath.  Persistent bradycardia between 40 and 50 bpm, EKG with U wave and T4 of 0.73, concerning for hypokalemia versus sequelae of central hypothyroidism.  Asymptomatic.  - Continue Flexeril  5 mg PRN - Awaiting endocrinology recommendations as above if Synthroid  needs to be increased - Awaiting echocardiogram results - Consider K threshold of >4 Chronic health problem Central hypothyroidism: Continue Synthroid  125 mcg daily. MDD: Fluoxetine  10 mg daily, restarted Abilify  1/5  Constipation Abdominal pain Abdominal pain most likely secondary to constipation as patient does not report a bowel  movement since admission.  Per chart review, patient does have an at home bowel regimen that we had been holding as he came in with loose stools. - MiraLAX  daily - Senna daily  FEN/GI: Regular diet PPx: Lovenox  Dispo:Home tomorrow pending endocrinology recommendations.  Subjective:  Patient was seen and examined at bedside.  Tells me he is doing well and has no complaints besides no bowel movement since admission.  Objective: Temp:  [98.1 F (36.7 C)-98.2 F (36.8 C)] 98.2 F (36.8 C) (01/05 0351) Pulse Rate:  [45-50] 45 (01/05 0351) Resp:  [19] 19 (01/05 0351) BP: (111-146)/(78-92) 146/92 (01/05 0351) SpO2:  [96 %-99 %] 99 % (01/05 0351) Physical Exam: General: Resting comfortably in hospital bed in no acute distress Cardiovascular: Bradycardic, no M/R/G Respiratory: CTAB, normal work of breathing on room air, no W/R/R Abdomen: Soft, mild diffuse tenderness, nondistended, BS present Extremities: No peripheral edema, moves all extremities equally  Laboratory: Most recent CBC Lab Results  Component Value Date   WBC 12.6 (H) 03/10/2024   HGB 10.2 (L) 03/10/2024   HCT 31.4 (L) 03/10/2024   MCV 83.3 03/10/2024   PLT 348 03/10/2024   Most recent BMP    Latest Ref Rng & Units 03/10/2024    4:39 AM  BMP  Glucose 70 - 99 mg/dL 898   BUN 6 - 20 mg/dL 17   Creatinine 9.38 - 1.24 mg/dL 9.01   Sodium 864 - 854 mmol/L 140   Potassium 3.5 - 5.1 mmol/L 3.7   Chloride 98 - 111 mmol/L 104   CO2 22 - 32 mmol/L 27   Calcium 8.9 - 10.3 mg/dL 8.6     Lupie Credit, DO 03/10/2024, 8:20 AM  PGY-1, [facility] FPTS  Intern pager: [phone], text pages welcome Secure chat group [facility] Teaching Service   "

## 2024-03-11 ENCOUNTER — Encounter: Payer: Self-pay | Admitting: Family Medicine

## 2024-03-11 ENCOUNTER — Other Ambulatory Visit (HOSPITAL_COMMUNITY): Payer: Self-pay

## 2024-03-11 DIAGNOSIS — R001 Bradycardia, unspecified: Secondary | ICD-10-CM

## 2024-03-11 LAB — CULTURE, BLOOD (ROUTINE X 2)
Culture: NO GROWTH
Culture: NO GROWTH

## 2024-03-11 LAB — GLUCOSE, CAPILLARY
Glucose-Capillary: 117 mg/dL — ABNORMAL HIGH (ref 70–99)
Glucose-Capillary: 95 mg/dL (ref 70–99)

## 2024-03-11 MED ORDER — HYDROCORTISONE 5 MG PO TABS
5.0000 mg | ORAL_TABLET | Freq: Every evening | ORAL | 0 refills | Status: AC
  Filled 2024-03-11 (×2): qty 90, 90d supply, fill #0

## 2024-03-11 MED ORDER — LEVOTHYROXINE SODIUM 137 MCG PO TABS
137.0000 ug | ORAL_TABLET | Freq: Every morning | ORAL | 0 refills | Status: AC
  Filled 2024-03-11: qty 30, 30d supply, fill #0

## 2024-03-11 MED ORDER — HYDROCORTISONE 10 MG PO TABS
ORAL_TABLET | ORAL | 1 refills | Status: AC
  Filled 2024-03-11: qty 90, 46d supply, fill #0

## 2024-03-11 NOTE — Assessment & Plan Note (Deleted)
 Central hypothyroidism: Continue Synthroid  125 mcg daily. MDD: Fluoxetine  10 mg daily, restarted Abilify  1/5

## 2024-03-11 NOTE — Assessment & Plan Note (Deleted)
 Denies chest pain, shortness of breath.  Persistent bradycardia between 40 and 50 bpm, EKG with U wave and T4 of 0.73, concerning for hypokalemia versus sequelae of central hypothyroidism.  Asymptomatic.  - Continue Flexeril  5 mg PRN - Awaiting endocrinology recommendations as above if Synthroid  needs to be increased - Awaiting echocardiogram results - Consider K threshold of >4

## 2024-03-11 NOTE — Assessment & Plan Note (Deleted)
 Significantly improved, doing well.  - Pain control: Tylenol  625 mg Q6H PRN Back - Endocrinology Dr. Faythe consulted, awaiting recommendations for stress dose taper  - Continue hydrocortisone  - Continue very sensitive SSI

## 2024-03-11 NOTE — Assessment & Plan Note (Deleted)
 Abdominal pain most likely secondary to constipation as patient does not report a bowel movement since admission.  Per chart review, patient does have an at home bowel regimen that we had been holding as he came in with loose stools. - MiraLAX  daily - Senna daily

## 2024-03-11 NOTE — Discharge Instructions (Addendum)
 Dear Archibald Flitton,   Thank you for letting us  participate in your care! You were admitted to the hospital for sepsis. You were given antibiotics and stress dose steroids.    POST-HOSPITAL & CARE INSTRUCTIONS We recommend following up with your PCP within 1 week from being discharged from the hospital. Please let PCP/Specialists know of any changes in medications that were made which you will be able to see in the medications section of this packet. Take your stress dose steroids for the next week until you see Dr. Faythe. Take 30 mg in the morning and 10 mg in the evening. If you have any questions about this regimen please reach out to Dr. Micheline office as soon as possible. Please call Dr. Faythe for an appointment within the week at [phone].  DOCTOR'S APPOINTMENTS & FOLLOW UP Future Appointments  Date Time Provider Department Center  04/29/2024  3:50 PM Alba Sharper, MD [facility]     Thank you for choosing [facility]! Take care and be well!  Family Medicine Teaching Service Inpatient Team [place]  [facility]  [address] [place], [place] [address] [phone]

## 2024-03-11 NOTE — Progress Notes (Signed)
 Occupational Therapy Treatment Patient Details Name: Sean Kim MRN: [id number] DOB: [birth year] Today's Date: 03/11/2024   History of present illness 24 y.o. male presents 03/06/24 with hypotension, abdominal pain, lethargic, and low grade fever. Admit with concern for sepsis. PMH: adrenal insufficiency, congential hypothyroidism, developmental delay, seizures. Recent admit for adrenal crisis and d/c 02/29/24.   OT comments  Patient is back to his baseline.  No acute or post acute OT needs.  Discharged from OT.  Recommend home with parents once medically cleared.  Can continue mobility with RN staff.        If plan is discharge home, recommend the following:  Other (comment)   Equipment Recommendations  None recommended by OT    Recommendations for Other Services      Precautions / Restrictions Precautions Precautions: None Recall of Precautions/Restrictions: Intact Restrictions Weight Bearing Restrictions Per Provider Order: No       Mobility Bed Mobility Overal bed mobility: Independent                  Transfers Overall transfer level: Independent                       Balance Overall balance assessment: No apparent balance deficits (not formally assessed)                                         ADL either performed or assessed with clinical judgement   ADL Overall ADL's : At baseline                                            Extremity/Trunk Assessment Upper Extremity Assessment Upper Extremity Assessment: Overall WFL for tasks assessed   Lower Extremity Assessment Lower Extremity Assessment: Overall WFL for tasks assessed   Cervical / Trunk Assessment Cervical / Trunk Assessment: Normal    Vision Baseline Vision/History: 1 Wears glasses Patient Visual Report: No change from baseline     Perception Perception Perception: Not tested   Praxis Praxis Praxis: Not tested   Communication  Communication Communication: No apparent difficulties   Cognition Arousal: Alert Behavior During Therapy: WFL for tasks assessed/performed Cognition: History of cognitive impairments             OT - Cognition Comments: developmental delay                 Following commands: Intact        Cueing   Cueing Techniques: Verbal cues                     Pertinent Vitals/ Pain       Pain Assessment Pain Assessment: No/denies pain                                                          Frequency           Progress Toward Goals  OT Goals(current goals can now be found in the care plan section)  Progress towards OT goals: Goals met/education completed, patient discharged from OT  Acute Rehab OT Goals  OT Goal Formulation: With patient Time For Goal Achievement: 03/14/24 Potential to Achieve Goals: Good  Plan      Co-evaluation                 AM-PAC OT 6 Clicks Daily Activity     Outcome Measure   Help from another person eating meals?: None Help from another person taking care of personal grooming?: None Help from another person toileting, which includes using toliet, bedpan, or urinal?: None Help from another person bathing (including washing, rinsing, drying)?: None Help from another person to put on and taking off regular upper body clothing?: None Help from another person to put on and taking off regular lower body clothing?: None 6 Click Score: 24    End of Session    OT Visit Diagnosis: Other symptoms and signs involving cognitive function   Activity Tolerance Patient tolerated treatment well   Patient Left in bed;with call bell/phone within reach   Nurse Communication Mobility status        Time: 9159-9142 OT Time Calculation (min): 17 min  Charges: OT General Charges $OT Visit: 1 Visit OT Treatments $Self Care/Home Management : 8-22 mins  03/11/2024  RP, OTR/L  Acute Rehabilitation  Services  Office:  [phone]   [name] 03/11/2024, 8:59 AM

## 2024-03-11 NOTE — TOC CM/SW Note (Signed)
 Transition of Care [facility]) - Inpatient Brief Assessment   Patient Details  Name: Sean Kim MRN: [id number] Date of Birth: 12-Apr-2000  Transition of Care [facility]) CM/SW Contact:    [name], [name], RN Phone Number: 03/11/2024, 9:35 AM   Clinical Narrative:  Patient presented to the ED with Abdominal Pain, Body Aches, Fever and Congestion. Admitted with Sepsis with Acute Renal Failure and septic shock. Currently on Solucortef, on room air. Patient was recently treated and discharged from the hospital for Adrenal Crisis Flu A on 02/29/24.  Patient has hx of Adrenal Insufficiency, Congenital Hypothyroidism, Developmental Delay.   CM spoke with patient at bedside and mother, Sean Kim via phone. Patient lives with both parents and his two out of three siblings. Independent with his care. Currently enrolled at [facility]. Family transports to and from his appointments. Des not have DME's at home.  PCP is Alba Sharper, MD and uses [facility] on [address] in [place].   No ICM needs or recommendations noted at this time.  Patient not Medically ready for discharge.  CM will continue to follow as patient progresses with care towards discharge.            Transition of Care Asessment: Insurance and Status: Insurance coverage has been reviewed   Home environment has been reviewed: Yes Prior level of function:: Independent Prior/Current Home Services: No current home services Social Drivers of Health Review: SDOH reviewed no interventions necessary Readmission risk has been reviewed: Yes Transition of care needs: no transition of care needs at this time

## 2024-03-11 NOTE — Progress Notes (Signed)
 DISCHARGE NOTE HOME Sean Kim to be discharged Home per MD order. Discussed prescriptions and follow up appointments with the patient. Prescriptions given to patient; medication list explained in detail. Patient verbalized understanding.  Skin clean, dry and intact without evidence of skin break down, no evidence of skin tears noted. IV catheter discontinued intact. Site without signs and symptoms of complications. Dressing and pressure applied. Pt denies pain at the site currently. No complaints noted.  Patient free of lines, drains, and wounds.   An After Visit Summary (AVS) was printed and given to the patient. Patient escorted via wheelchair, and discharged home via private auto.  Doyal Sias, RN

## 2024-03-11 NOTE — Plan of Care (Signed)
" °  Problem: Health Behavior/Discharge Planning: Goal: Ability to identify and utilize available resources and services will improve Outcome: Progressing Goal: Ability to manage health-related needs will improve Outcome: Progressing   Problem: Metabolic: Goal: Ability to maintain appropriate glucose levels will improve Outcome: Progressing   Problem: Nutritional: Goal: Maintenance of adequate nutrition will improve Outcome: Progressing Goal: Progress toward achieving an optimal weight will improve Outcome: Progressing   Problem: Skin Integrity: Goal: Risk for impaired skin integrity will decrease Outcome: Progressing   Problem: Tissue Perfusion: Goal: Adequacy of tissue perfusion will improve Outcome: Progressing   Problem: Health Behavior/Discharge Planning: Goal: Ability to manage health-related needs will improve Outcome: Progressing   Problem: Clinical Measurements: Goal: Ability to maintain clinical measurements within normal limits will improve Outcome: Progressing Goal: Will remain free from infection Outcome: Progressing Goal: Diagnostic test results will improve Outcome: Progressing Goal: Respiratory complications will improve Outcome: Progressing Goal: Cardiovascular complication will be avoided Outcome: Progressing   Problem: Activity: Goal: Risk for activity intolerance will decrease Outcome: Progressing   Problem: Nutrition: Goal: Adequate nutrition will be maintained Outcome: Progressing   Problem: Coping: Goal: Level of anxiety will decrease Outcome: Progressing   Problem: Elimination: Goal: Will not experience complications related to bowel motility Outcome: Progressing Goal: Will not experience complications related to urinary retention Outcome: Progressing   Problem: Pain Managment: Goal: General experience of comfort will improve and/or be controlled Outcome: Progressing   Problem: Safety: Goal: Ability to remain free from injury will  improve Outcome: Progressing   Problem: Skin Integrity: Goal: Risk for impaired skin integrity will decrease Outcome: Progressing   "

## 2024-03-11 NOTE — Discharge Summary (Addendum)
 "  Family Medicine Teaching [facility] Discharge Summary  Patient name: Sean Kim Medical record number: [id number] Date of birth: 08-06-2000 Age: 24 y.o. Gender: male Date of Admission: 03/06/2024  Date of Discharge: 03/11/2024  Admitting Physician: [place] KATHEE Samuels, DO  Primary Care Provider: Alba Sharper, MD Consultants: Endocrinology   Indication for Hospitalization: Adrenal crisis   Discharge Diagnoses/Problem List:  Principal Problem for Admission: Adrenal crisis  Other Problems addressed during stay:   Active Problems:   Adrenal insufficiency (Addison's disease) (HCC)   Long QT syndrome   Constipation   Abnormal EKG   Influenza   Abdominal pain   Bradycardia   Brief Hospital Course:  Erikson Moder is a 24 y.o.male with a history of adrenal insufficiency, congential hypothyroidism and influenza infection who was admitted to the [facility] Teaching Service at [facility] w/ concern for sepsis. His hospital course is detailed below:  Concern for Infection, sepsis ruled out, presentation due to adrenal insufficiency as below  Pt admitted w/ AMS, hypotension, tachycardia, white count, elevated lactic. Bcx drawn, empiric antibiotics started w/ cefepime , vanc and flagyl . UA, CXR and CT AP all non revealing. Dad have recent infection with influenza. Lactic and white count downtrended overnight. Bcx showed no growth at 2 days and antibiotics were discontinued on 03/08/2024, leukocytosis and patient continued to improve clinically.  Adrenal insufficiency Patient borderline hypotensive the evening of admission. Patient was restarted on stress dose steroids, started on 50 mg BID  but had continued bradycardia and increased to 100 mg BID on 1/3. Was then tapered and started po sick dosing of double home regimen on 1/5 and was discharged on this dose.   AKI  Patient had decreased PO intake prior to admission. No baseline CKD, Cr on admission was 2.67, which downtrended to 1.96 after 2L  bolus in the ED and 150ml/hr IVF on day 1. Cr downtrended to normal range remained stable.  Abnormal EKG  bradycardia Central Hypothyroidism  Patient with persistent QT prolongation, his home abilify  was held throughout admission. He also has chronic T wave inversions on EKG and was endorsing chest wall pain that was likely MSK in nature. EKGs appeared stable and trop x 1 was WNL but with persistently new bradycardia and U waves on EKGs, echocardiogram was ordered and found EF of 60-65%, no other abnormalities. Home endocrinologist was consulted and recommended increasing levothyroxine  to 137 mcg and checking a free T4 in office in about 4 to 6 weeks (aim should be 0.8) as EKG changes and bradycardia are most likely related to his hypothyroidism as well as continuing the stress double dose of home hydrocortisone  that he is on inpatient (30 mg a.m. and 10 mg p.m.) on discharge and following up in 1 week in his office to see if patient can be reduced to normal home regimen.   Other chronic conditions were medically managed with home medications and formulary alternatives as necessary (Central hypothyroidism, MDD, constipation)  PCP Follow-up Recommendations: Follow up with endocrinology  2.  Free T4 in 4-6 weeks   Results/Tests Pending at Time of Discharge:  None  Disposition: Home  Discharge Condition: Stable  Discharge Exam:  Vitals:   03/11/24 0402 03/11/24 0740  BP: 120/87 115/79  Pulse: 60 70  Resp: 18 19  Temp: 98.2 F (36.8 C) 98.3 F (36.8 C)  SpO2: 99% 96%   General: A&O, NAD Cardiac: RRR, no m/r/g Respiratory: CTAB, normal WOB, no w/c/r GI: Soft, NTTP, non-distended  Extremities: NTTP, no peripheral edema. Neuro: moves  all four extremities appropriately. Psych: Appropriate mood and affect  Significant Procedures: None  Significant Labs and Imaging:  Recent Labs  Lab 03/10/24 0439  WBC 12.6*  HGB 10.2*  HCT 31.4*  PLT 348   Recent Labs  Lab 03/10/24 0439   NA 140  K 3.7  CL 104  CO2 27  GLUCOSE 101*  BUN 17  CREATININE 0.98  CALCIUM 8.6*  MG 2.2   03/06/2024 Chest Xray  1. Low lung volumes. 2. No acute process.  03/06/2024 CT Abdomen Pelvis W Contrast  1. No acute intra-abdominal or pelvic pathology. No bowel obstruction. Normal appendix. 2. Partially visualized trace bilateral pleural effusions.  03/09/2024 US  Abdomen Limited RUQ  1. No acute findings.   03/09/2024 Echocardiogram    1. Left ventricular ejection fraction, by estimation, is 60 to 65%. The left ventricle has normal function. The left ventricle has no regional wall motion abnormalities. Left ventricular diastolic parameters were normal.  2. Right ventricular systolic function is normal. The right ventricular size is normal.  3. The mitral valve is normal in structure. Trivial mitral valve regurgitation. No evidence of mitral stenosis.  4. The aortic valve is normal in structure. Aortic valve regurgitation is not visualized. No aortic stenosis is present.  5. The inferior vena cava is normal in size with greater than 50% respiratory variability, suggesting right atrial pressure of 3 mmHg.   Discharge Medications:  Allergies as of 03/11/2024   No Known Allergies      Medication List     TAKE these medications    acetaminophen  325 MG tablet Commonly known as: Tylenol  Take 2 tablets (650 mg total) by mouth every 6 (six) hours as needed for mild pain (pain score 1-3) or moderate pain (pain score 4-6).   ARIPiprazole  10 MG tablet Commonly known as: ABILIFY  Take 10 mg by mouth at bedtime.   BACK & BODY EXTRA STRENGTH PO Take 1 tablet by mouth daily as needed (pain).   Blood Glucose Monitoring Suppl w/Device Kit 1 each by Does not apply route once as needed for up to 1 dose.   FLUoxetine  10 MG capsule Commonly known as: PROZAC  Take 10 mg by mouth every morning.   fluticasone  50 MCG/ACT nasal spray Commonly known as: FLONASE  Place 2 sprays into both  nostrils daily.   Glucagon  Emergency 1 MG/ML Solr Inject 1 mg as directed as needed (blood sugar).   hydrocortisone  10 MG tablet Commonly known as: CORTEF  Take double dose (30 mg) for one week, then decrease back to home dose (15 mg) as instructed by doctor What changed:  how much to take how to take this when to take this additional instructions   hydrocortisone  5 MG tablet Commonly known as: Cortef  Take 1 tablet (5 mg total) by mouth at bedtime. Take double dose (10 mg) for one week, then decrease back to home dose (5 mg) as instructed by doctor. What changed: additional instructions   ibuprofen  200 MG tablet Commonly known as: ADVIL  Take 400 mg by mouth every 6 (six) hours as needed for moderate pain (pain score 4-6).   levothyroxine  137 MCG tablet Commonly known as: SYNTHROID  Take 1 tablet (137 mcg total) by mouth every morning. Start taking on: March 12, 2024 What changed:  medication strength how much to take   ondansetron  4 MG disintegrating tablet Commonly known as: ZOFRAN -ODT Take 1 tablet (4 mg total) by mouth every 8 (eight) hours as needed for nausea or vomiting.   polyethylene glycol powder  17 GM/SCOOP powder Commonly known as: GLYCOLAX /MIRALAX  Take 1 capful (17 g) by mouth daily.   senna 8.6 MG Tabs tablet Commonly known as: SENOKOT Take 1 tablet (8.6 mg total) by mouth daily.       Discharge Instructions: Please refer to Patient Instructions section of EMR for full details.  Patient was counseled important signs and symptoms that should prompt return to medical care, changes in medications, dietary instructions, activity restrictions, and follow up appointments.   Future Appointments  Date Time Provider Department Center  04/29/2024  3:50 PM Alba Sharper, MD [facility]  Will schedule appointment with his Endocrinologist Dr. Faythe in about 1 week.   Nicholas Bar, MD 03/11/2024, 5:19 PM PGY-3, [facility]  "

## 2024-03-11 NOTE — TOC Transition Note (Signed)
 Transition of Care [facility]) - Discharge Note   Patient Details  Name: Sean Kim MRN: [id number] Date of Birth: 2000/12/24  [facility]) CM/SW Contact:  [name], Floy Riegler Daphne, RN Phone Number: 03/11/2024, 3:38 PM   Clinical Narrative:     Patient is scheduled for discharge today.  Readmission Risk Assessment done. Hospital f/u and discharge instructions on AVS. Prescriptions sent to [facility] pharmacy and patient will receive meds prior discharge. No ICM needs or recommendations noted. Mother, Edmond will transport at discharge.  No further ICM needs noted.       Final next level of care: Home/Self Care Barriers to Discharge: Barriers Resolved   Patient Goals and CMS Choice Patient states their goals for this hospitalization and ongoing recovery are:: To return home CMS Medicare.gov Compare Post Acute Care list provided to:: Patient Choice offered to / list presented to : Patient, Parent (Mother, Edmond)      Discharge Placement                Patient to be transferred to facility by: Mother Name of family member notified: [facility]    Discharge Plan and Services Additional resources added to the After Visit Summary for                  DME Arranged: N/A DME Agency: NA       HH Arranged: NA HH Agency: NA        Social Drivers of Health (SDOH) Interventions SDOH Screenings   Food Insecurity: Food Insecurity Present (03/07/2024)  Housing: Low Risk (03/07/2024)  Transportation Needs: No Transportation Needs (03/07/2024)  Recent Concern: Transportation Needs - Unmet Transportation Needs (02/28/2024)  Utilities: Not At Risk (03/07/2024)  Depression (PHQ2-9): Low Risk (03/05/2024)  Financial Resource Strain: Medium Risk (03/05/2024)  Physical Activity: Inactive (03/05/2024)  Social Connections: Moderately Isolated (03/05/2024)  Stress: Stress Concern Present (03/05/2024)  Tobacco Use: Low Risk (03/06/2024)     Readmission Risk  Interventions    03/11/2024    9:34 AM  Readmission Risk Prevention Plan  Post Dischage Appt Complete  Medication Screening Complete  Transportation Screening Complete

## 2024-03-13 ENCOUNTER — Encounter: Payer: Self-pay | Admitting: Family Medicine

## 2024-04-04 ENCOUNTER — Other Ambulatory Visit: Payer: Self-pay

## 2024-04-04 DIAGNOSIS — E271 Primary adrenocortical insufficiency: Secondary | ICD-10-CM

## 2024-04-09 ENCOUNTER — Telehealth: Payer: MEDICAID

## 2024-04-09 DIAGNOSIS — K047 Periapical abscess without sinus: Secondary | ICD-10-CM

## 2024-04-10 MED ORDER — NAPROXEN 500 MG PO TABS: 500.0000 mg | ORAL_TABLET | Freq: Two times a day (BID) | ORAL | 0 refills | Status: AC

## 2024-04-10 MED ORDER — AMOXICILLIN-POT CLAVULANATE 875-125 MG PO TABS: 1.0000 | ORAL_TABLET | Freq: Two times a day (BID) | ORAL | 0 refills | Status: AC

## 2024-04-10 NOTE — Progress Notes (Signed)
 E-Visit for Dental Pain  We are sorry that you are not feeling well.  Here is how we plan to help!  Based on what you have shared with me in the questionnaire, it sounds like you have a dental infection. On the photo there appears to be an area of ulceration which could potentially have been from abscess that has drained some, etc. I have prescribed Augmentin  875-125mg  twice a day for 7 days and Naprosyn  500mg  2 times a day for 7 days for discomfort  It is imperative that you see a dentist within 10 days of this eVisit to determine the cause of the dental pain and be sure it is adequately treated  A toothache or tooth pain is caused when the nerve in the root of a tooth or surrounding a tooth is irritated. Dental (tooth) infection, decay, injury, or loss of a tooth are the most common causes of dental pain. Pain may also occur after an extraction (tooth is pulled out). Pain sometimes originates from other areas and radiates to the jaw, thus appearing to be tooth pain.Bacteria growing inside your mouth can contribute to gum disease and dental decay, both of which can cause pain. A toothache occurs from inflammation of the central portion of the tooth called pulp. The pulp contains nerve endings that are very sensitive to pain. Inflammation to the pulp or pulpitis may be caused by dental cavities, trauma, and infection.    HOME CARE:   For toothaches: Over-the-counter pain medications such as acetaminophen  or ibuprofen  may be used. Take these as directed on the package while you arrange for a dental appointment. Avoid very cold or hot foods, because they may make the pain worse. You may get relief from biting on a cotton ball soaked in oil of cloves. You can get oil of cloves at most drug stores.  For jaw pain:  Aspirin  may be helpful for problems in the joint of the jaw in adults. If pain happens every time you open your mouth widely, the temporomandibular joint (TMJ) may be the source of the  pain. Yawning or taking a large bite of food may worsen the pain. An appointment with your doctor or dentist will help you find the cause.     GET HELP RIGHT AWAY IF:  You have a high fever or chills If you have had a recent head or face injury and develop headache, light headedness, nausea, vomiting, or other symptoms that concern you after an injury to your face or mouth, you could have a more serious injury in addition to your dental injury. A facial rash associated with a toothache: This condition may improve with medication. Contact your doctor for them to decide what is appropriate. Any jaw pain occurring with chest pain: Although jaw pain is most commonly caused by dental disease, it is sometimes referred pain from other areas. People with heart disease, especially people who have had stents placed, people with diabetes, or those who have had heart surgery may have jaw pain as a symptom of heart attack or angina. If your jaw or tooth pain is associated with lightheadedness, sweating, or shortness of breath, you should see a doctor as soon as possible. Trouble swallowing or excessive pain or bleeding from gums: If you have a history of a weakened immune system, diabetes, or steroid use, you may be more susceptible to infections. Infections can often be more severe and extensive or caused by unusual organisms. Dental and gum infections in people with these  conditions may require more aggressive treatment. An abscess may need draining or IV antibiotics, for example.  MAKE SURE YOU   Understand these instructions. Will watch your condition. Will get help right away if you are not doing well or get worse.  Thank you for choosing an e-visit.  Your e-visit answers were reviewed by a board certified advanced clinical practitioner to complete your personal care plan. Depending upon the condition, your plan could have included both over the counter or prescription medications.  Please review your  pharmacy choice. Make sure the pharmacy is open so you can pick up prescription now. If there is a problem, you may contact your provider through [employer] and have the prescription routed to another pharmacy.  Your safety is important to us . If you have drug allergies check your prescription carefully.   For the next 24 hours you can use MyChart to ask questions about today's visit, request a non-urgent call back, or ask for a work or school excuse. You will get an email in the next two days asking about your experience. I hope that your e-visit has been valuable and will speed your recovery.  I have spent 5 minutes in review of e-visit questionnaire, review and updating patient chart, medical decision making and response to patient.   Elsie Velma Lunger, PA-C

## 2024-04-29 ENCOUNTER — Ambulatory Visit: Payer: Self-pay | Admitting: Family Medicine
